# Patient Record
Sex: Female | Born: 1955 | Race: White | Hispanic: No | Smoking: Former smoker
Health system: Southern US, Community
[De-identification: ages and names within clinical notes are randomized; demographics above are authoritative.]

## PROBLEM LIST (undated history)

## (undated) DIAGNOSIS — K219 Gastro-esophageal reflux disease without esophagitis: Secondary | ICD-10-CM

## (undated) DIAGNOSIS — I10 Essential (primary) hypertension: Secondary | ICD-10-CM

## (undated) DIAGNOSIS — E119 Type 2 diabetes mellitus without complications: Secondary | ICD-10-CM

## (undated) DIAGNOSIS — I4719 Other supraventricular tachycardia: Secondary | ICD-10-CM

## (undated) DIAGNOSIS — R002 Palpitations: Secondary | ICD-10-CM

## (undated) DIAGNOSIS — I80202 Phlebitis and thrombophlebitis of unspecified deep vessels of left lower extremity: Secondary | ICD-10-CM

## (undated) DIAGNOSIS — I482 Chronic atrial fibrillation, unspecified: Secondary | ICD-10-CM

## (undated) DIAGNOSIS — E039 Hypothyroidism, unspecified: Secondary | ICD-10-CM

## (undated) DIAGNOSIS — I471 Supraventricular tachycardia: Secondary | ICD-10-CM

## (undated) DIAGNOSIS — Z8709 Personal history of other diseases of the respiratory system: Secondary | ICD-10-CM

## (undated) DIAGNOSIS — E1169 Type 2 diabetes mellitus with other specified complication: Secondary | ICD-10-CM

## (undated) DIAGNOSIS — E782 Mixed hyperlipidemia: Secondary | ICD-10-CM

## (undated) DIAGNOSIS — E663 Overweight: Secondary | ICD-10-CM

## (undated) DIAGNOSIS — E785 Hyperlipidemia, unspecified: Secondary | ICD-10-CM

## (undated) HISTORY — DX: Other supraventricular tachycardia: I47.19

## (undated) HISTORY — DX: Mixed hyperlipidemia: E78.2

## (undated) HISTORY — DX: Personal history of other diseases of the respiratory system: Z87.09

## (undated) HISTORY — DX: Hypothyroidism, unspecified: E03.9

## (undated) HISTORY — DX: Hyperlipidemia, unspecified: E78.5

## (undated) HISTORY — DX: Chronic atrial fibrillation, unspecified: I48.20

## (undated) HISTORY — DX: Type 2 diabetes mellitus with other specified complication: E11.69

## (undated) HISTORY — DX: Essential (primary) hypertension: I10

## (undated) HISTORY — DX: Palpitations: R00.2

## (undated) HISTORY — PX: NASAL SINUS SURGERY: SHX719

## (undated) HISTORY — DX: Phlebitis and thrombophlebitis of unspecified deep vessels of left lower extremity: I80.202

## (undated) HISTORY — PX: REPLACEMENT TOTAL KNEE: SUR1224

## (undated) HISTORY — DX: Gastro-esophageal reflux disease without esophagitis: K21.9

## (undated) HISTORY — DX: Supraventricular tachycardia: I47.1

## (undated) HISTORY — DX: Overweight: E66.3

---

## 1992-09-08 HISTORY — PX: BACK SURGERY: SHX140

## 1999-10-23 ENCOUNTER — Encounter: Admission: RE | Admit: 1999-10-23 | Discharge: 1999-10-23 | Payer: Self-pay | Admitting: Family Medicine

## 1999-10-23 ENCOUNTER — Encounter: Payer: Self-pay | Admitting: Family Medicine

## 1999-12-24 ENCOUNTER — Other Ambulatory Visit: Admission: RE | Admit: 1999-12-24 | Discharge: 1999-12-24 | Payer: Self-pay | Admitting: Obstetrics and Gynecology

## 2000-09-08 HISTORY — PX: COLONOSCOPY: SHX174

## 2000-09-29 ENCOUNTER — Encounter: Admission: RE | Admit: 2000-09-29 | Discharge: 2000-09-29 | Payer: Self-pay | Admitting: Family Medicine

## 2000-09-29 ENCOUNTER — Encounter: Payer: Self-pay | Admitting: Family Medicine

## 2001-06-11 ENCOUNTER — Encounter (INDEPENDENT_AMBULATORY_CARE_PROVIDER_SITE_OTHER): Payer: Self-pay | Admitting: Specialist

## 2001-06-11 ENCOUNTER — Other Ambulatory Visit: Admission: RE | Admit: 2001-06-11 | Discharge: 2001-06-11 | Payer: Self-pay | Admitting: Gastroenterology

## 2001-09-27 ENCOUNTER — Encounter (HOSPITAL_COMMUNITY): Admission: RE | Admit: 2001-09-27 | Discharge: 2001-12-26 | Payer: Self-pay | Admitting: Gastroenterology

## 2001-10-11 ENCOUNTER — Encounter: Admission: RE | Admit: 2001-10-11 | Discharge: 2002-01-09 | Payer: Self-pay | Admitting: Gastroenterology

## 2002-05-16 ENCOUNTER — Encounter: Payer: Self-pay | Admitting: Family Medicine

## 2002-05-16 ENCOUNTER — Encounter: Admission: RE | Admit: 2002-05-16 | Discharge: 2002-05-16 | Payer: Self-pay | Admitting: Family Medicine

## 2002-05-17 ENCOUNTER — Other Ambulatory Visit: Admission: RE | Admit: 2002-05-17 | Discharge: 2002-05-17 | Payer: Self-pay | Admitting: Obstetrics and Gynecology

## 2003-06-08 ENCOUNTER — Encounter: Payer: Self-pay | Admitting: Obstetrics and Gynecology

## 2003-06-08 ENCOUNTER — Encounter: Admission: RE | Admit: 2003-06-08 | Discharge: 2003-06-08 | Payer: Self-pay | Admitting: Obstetrics and Gynecology

## 2004-09-10 ENCOUNTER — Encounter: Admission: RE | Admit: 2004-09-10 | Discharge: 2004-09-10 | Payer: Self-pay | Admitting: Family Medicine

## 2005-10-03 ENCOUNTER — Encounter: Admission: RE | Admit: 2005-10-03 | Discharge: 2005-10-03 | Payer: Self-pay | Admitting: Family Medicine

## 2006-01-26 ENCOUNTER — Other Ambulatory Visit: Admission: RE | Admit: 2006-01-26 | Discharge: 2006-01-26 | Payer: Self-pay | Admitting: Obstetrics and Gynecology

## 2006-11-03 ENCOUNTER — Encounter: Admission: RE | Admit: 2006-11-03 | Discharge: 2006-11-03 | Payer: Self-pay | Admitting: Obstetrics and Gynecology

## 2007-12-02 ENCOUNTER — Encounter: Admission: RE | Admit: 2007-12-02 | Discharge: 2007-12-02 | Payer: Self-pay | Admitting: Obstetrics and Gynecology

## 2008-12-04 ENCOUNTER — Encounter: Admission: RE | Admit: 2008-12-04 | Discharge: 2008-12-04 | Payer: Self-pay | Admitting: Obstetrics and Gynecology

## 2009-12-05 ENCOUNTER — Ambulatory Visit: Payer: Self-pay | Admitting: Cardiovascular Disease

## 2009-12-13 ENCOUNTER — Telehealth (INDEPENDENT_AMBULATORY_CARE_PROVIDER_SITE_OTHER): Payer: Self-pay | Admitting: *Deleted

## 2009-12-13 ENCOUNTER — Ambulatory Visit: Payer: Self-pay | Admitting: Internal Medicine

## 2010-01-03 ENCOUNTER — Telehealth (INDEPENDENT_AMBULATORY_CARE_PROVIDER_SITE_OTHER): Payer: Self-pay | Admitting: *Deleted

## 2010-01-07 ENCOUNTER — Encounter (HOSPITAL_COMMUNITY): Admission: RE | Admit: 2010-01-07 | Discharge: 2010-03-13 | Payer: Self-pay | Admitting: Internal Medicine

## 2010-01-07 ENCOUNTER — Ambulatory Visit: Payer: Self-pay | Admitting: Cardiology

## 2010-01-07 ENCOUNTER — Ambulatory Visit: Payer: Self-pay

## 2010-01-08 ENCOUNTER — Encounter: Payer: Self-pay | Admitting: Cardiology

## 2010-01-08 ENCOUNTER — Ambulatory Visit: Payer: Self-pay

## 2010-01-10 ENCOUNTER — Telehealth: Payer: Self-pay | Admitting: Internal Medicine

## 2010-01-11 ENCOUNTER — Ambulatory Visit: Payer: Self-pay | Admitting: Cardiovascular Disease

## 2010-03-08 ENCOUNTER — Encounter: Admission: RE | Admit: 2010-03-08 | Discharge: 2010-03-08 | Payer: Self-pay | Admitting: Obstetrics and Gynecology

## 2010-09-29 ENCOUNTER — Encounter: Payer: Self-pay | Admitting: Obstetrics and Gynecology

## 2010-10-10 NOTE — Assessment & Plan Note (Signed)
Summary: Cardiology Nuclear  Study  Nuclear Med Background Indications for Stress Test: Evaluation for Ischemia, Abnormal EKG   History: Echo  History Comments: 01/10 ECHO NL LVF  Symptoms: Chest Pain, Chest Pain with Exertion, DOE, Palpitations    Nuclear Pre-Procedure Cardiac Risk Factors: Hypertension, Lipids, Obesity Caffeine/Decaff Intake: None NPO After: 5:30 PM Lungs: clear IV 0.9% NS with Angio Cath: 22g     IV Site: (R) AC IV Started by: Irean Hong RN Chest Size (in) 48     Cup Size DD     Height (in): 62 Weight (lb): 252 BMI: 46.26 Tech Comments: Held metoprolol 24 hrs.  Nuclear Med Study 1 or 2 day study:  2 day     Stress Test Type:  Eugenie Birks Reading MD:  Marca Ancona, MD     Referring MD:  S.Klein Resting Radionuclide:  Technetium 78m Tetrofosmin     Resting Radionuclide Dose:  33 mCi  Stress Radionuclide:  Technetium 106m Tetrofosmin     Stress Radionuclide Dose:  33 mCi   Stress Protocol   Lexiscan: 0.4 mg   Stress Test Technologist:  Milana Na EMT-P     Nuclear Technologist:  Domenic Polite CNMT  Rest Procedure  Myocardial perfusion imaging was performed at rest 45 minutes following the intravenous administration of Myoview Technetium 63m Tetrofosmin.  Stress Procedure  The patient received IV Lexiscan 0.4 mg over 15-seconds.  Myoview injected at 30-seconds.  There were no significant changes and a rare pac with infusion.  Quantitative spect images were obtained after a 45 minute delay.  QPS Raw Data Images:  Mild breast attenuation.  Normal left ventricular size. Stress Images:  Small, mild mid anterior perfusion defect.  Rest Images:  Small, mild mid anterior perfusion defect.  Subtraction (SDS):  Fixed small, mild mid anterior perfusion defect.  Transient Ischemic Dilatation:  .85  (Normal <1.22)  Lung/Heart Ratio:  .26  (Normal <0.45)  Quantitative Gated Spect Images QGS EDV:  95 ml QGS ESV:  20 ml QGS EF:  72 % QGS cine images:   Normal wall motion.    Overall Impression  Exercise Capacity: Lexiscan study.  BP Response: Normal blood pressure response. Clinical Symptoms: Short of breath.  ECG Impression: No significant ST segment change suggestive of ischemia. Overall Impression: Breast attenuation.  No evidence for ischemia or infarction.  Normal systolic function.   Appended Document: Cardiology Nuclear  Study plz let pt know looks good  Appended Document: Cardiology Nuclear  Study Victorino Dike plz inform patient thanks steve

## 2010-10-10 NOTE — Progress Notes (Signed)
----   Converted from flag ---- ---- 12/13/2009 11:57 AM, Marilynne Halsted, CMA, AAMA wrote: Lori Phelps,  Pt has [employer].  No precert required for anything with this insurance.  Thanks,  Charmaine ------------------------------

## 2010-10-10 NOTE — Progress Notes (Signed)
  Phone Note Outgoing Call   Call placed by: Dessie Coma LPN Call placed to: Patient Summary of Call: Patient notified per Dr. Graciela Husbands, stress test looked good.

## 2010-10-10 NOTE — Progress Notes (Signed)
Summary: Nuclear Pre-Procedure  Phone Note Outgoing Call   Call placed by: Milana Na, EMT-P,  January 03, 2010 12:29 PM Summary of Call: Reviewed information on Myoview Information Sheet (see scanned document for further details).  Spoke with patient.     Nuclear Med Background Indications for Stress Test: Evaluation for Ischemia, Abnormal EKG   History: Echo  History Comments: 01/10 ECHO NL LVF  Symptoms: Chest Pain, Chest Pain with Exertion, DOE, Palpitations    Nuclear Pre-Procedure Cardiac Risk Factors: Hypertension, Lipids, Obesity  Nuclear Med Study Referring MD:  Odessa Fleming

## 2010-12-30 ENCOUNTER — Other Ambulatory Visit: Payer: Self-pay | Admitting: Obstetrics and Gynecology

## 2010-12-30 DIAGNOSIS — Z1231 Encounter for screening mammogram for malignant neoplasm of breast: Secondary | ICD-10-CM

## 2011-01-21 NOTE — Assessment & Plan Note (Signed)
[facility]                         CARDIOLOGY OFFICE NOTE   NAME:Derks, KRISTAL PERL                       MRN:          [id number]  DATE:01/11/2010                            DOB:          08-12-1956    PROBLEM LIST:  1. Atrial tachycardia.  2. Hypertension.  3. Hyperlipidemia.  4. Hypothyroidism.   INTERVAL HISTORY:  Since her last visit she saw Dr. Graciela Husbands, who felt that  she was experiencing atrial tachycardia.  The patient states that since  her initial visit her palpitations have greatly improved.  She still  occasionally experiences some, they are not worrisome to her.  The  patient denies any significant chest discomfort.  She does not exercise  regularly.  She has been compliant with her medications.   PHYSICAL EXAMINATION:  VITAL SIGNS:  Blood pressure 167/90, pulse 71,  blood pressure rechecked was 138/93, sating 97% on room air, and weight  250 pounds which is 4-5 pounds less than she weighed at her last office  visit.  GENERAL:  No acute distress.  HEENT:  Normocephalic and atraumatic.  NECK:  Supple.  There is no JVD.  HEART:  Regular rate and rhythm without murmur, rub, or gallop.  LUNGS:  Clear bilaterally.  ABDOMEN:  Soft, nontender, and nondistended.  EXTREMITIES:  Trace edema.  SKIN:  Warm and dry.   Review of the patient's stress test, ejection fraction was normal, there  is no evidence of inducible ischemia.   ASSESSMENT AND PLAN:  1. Arrhythmia.  The patient appears has had atrial tachycardia.  Her      symptoms have been greatly improved.  She should continue on the      metoprolol twice a day.  2. Hypertension.  Blood pressure was initially elevated, but      significantly improved upon recheck.  She should continue on      metoprolol, HCTZ, and amlodipine.  3. Hyperlipidemia.  She is on simvastatin.  Should be followed by Dr.      Marina Goodell.  4. Hypothyroidism.  Dr. Marina Goodell is following her for this.  We will see    the patient back in 4 months' time.     Brayton El, MD  Electronically Signed    SGA/MedQ  DD: 01/11/2010  DT: 01/11/2010  Job #: [id number]   cc:   Brent Bulla, MD

## 2011-01-21 NOTE — Letter (Signed)
December 13, 2009    Brayton El, MD  [address]  [place], [place] [address]   RE:  Lori Phelps, Lori Phelps  MRN:  [id number]  /  DOB:  08-02-1956   Dear Roseanne Reno,   Thank you very much for asking me to see [facility] because of  palpitations.   As you know she is an obese 55 year old woman with a family history of  atrial fibrillation and polycystic kidney disease from which she is  spared who has a history of treated hypertension, dyslipidemia and other  risk factors for arrhythmias including obesity, who has had over the  last four years or so recurrent episodes of palpitations.   Initially these occurred relatively infrequently and she described them  mostly as skips.  Four months ago she had an episode that lasted on and  off for a couple of days; this was also mostly skipping.  Last week she  had episodes that were more problematic and she described those as  flopping.  When she came to see you in the office initial echocardiogram  was apparently normal.  Subsequent electrocardiogram showed nonsustained  atrial tachyarrhythmias.   Her past evaluation had included outpatient event monitoring done by Dr.  Marina Goodell.  Since she saw you, an echocardiogram has confirmed one from last  year that demonstrated normal left ventricular function, normal left  atrial size.   Thromboembolic risk factors is notable for hypertension and gender.   She does not have daytime somnolence or a.m. fatigue.  She does have  some snoring.   Blood work has been obtained and is notable for borderline elevated TSH  for which Synthroid was started.  She was also noted to be microcytic  but without anemia and iron therapy has been recommended.   She has some limitations of exertion at less than two flights of stairs.  She also has intermittent exertional chest pain.  Cardiac risk factors  are notable for family history and hypercholesterolemia.   PAST MEDICAL HISTORY:  Notable primarily as  above.   SOCIAL HISTORY:  She is married.  She has children and grandchildren.  She does not use cigarettes or recreational drugs.  She does drink wine  occasionally.   FAMILY HISTORY:  As noted above.  Interestingly, her father, paternal  uncle, paternal aunt and one of her siblings has atrial fibrillation.   Notable also they have a family history of polycystic kidney disease.   CURRENT MEDICATIONS:  1. Aspirin 81.  2. Synthroid 25 mcg.  3. Metoprolol 50, previously now raised to 100.  4. Amlodipine 10.  5. Omeprazole.  6. Simvastatin.  7. Hydrochlorothiazide 25.   ALLERGIES:  PEN AND SULFA.   REVIEW OF SYSTEMS:  Apart from the above is notable for tinnitus in her  left ear and is otherwise negative across multiple organ systems apart  from obesity.   EXAMINATION:  She is an middle-aged Caucasian female appearing her  stated age of 29.  Her blood pressure was 147/88, her pulse was 61, her  weight was 254 pounds.  She was in no acute distress.  She was alert and  oriented.  SKIN:  Warm and dry.  HEENT:  Normal.  NECK:  Veins were impossible to discern, carotids are brisk and full.  BACK:  Without kyphosis, scoliosis.  LUNGS:  Clear.  There was no CVA tenderness.  HEART:  Sounds were regular today without murmurs or gallops.  ABDOMEN:  Soft and protuberant.  Femoral  pulses were not appreciated.  Distal pulses were intact.  There  is no clubbing, cyanosis or edema.  NEUROLOGICAL:  Otherwise grossly normal.   Review of the electrocardiograms was interesting in that while  __________ by the computer's atrial fibrillation it does in fact  represent sinus rhythm with frequent and consecutive runs of PACs and  nonsustained atrial tachycardia.   IMPRESSION:  1. Atrial tachycardia - nonsustained with frequent premature atrial      contractions.  2. Strong family history for atrial fibrillation, likely to come in      the patient of which #1 is a harbinger.  3. Normal left  ventricular function.  4. Thromboembolic risk factors notable for gender and hypertension.  5. Exertional and nonexertional chest discomfort associated with      exertional dyspnea with cardiac risk factors.   Stew, Ms. Cadle has short runs of nonsustained atrial tachycardia which  likely will result in atrial fibrillation.  It is not there yet.  I  think the discussion then of anticoagulation is probably coming down the  road just as you began it with her the other day.  I think aspirin is a  reasonable thing at this point however given the lack of sustained  arrhythmia.   We discussed the potential role of antiarrhythmic drug therapy for  control of her symptoms.  We discussed the potential issues of  proarrhythmia.  She would like for right now to defer initiation of  therapy until we have a better handle on the frequency of her symptoms.  We also discussed the potential benefits it might have in presenting  remodelling which would make her atrial fibrillation possibly more  likely to sustain and persist.   I was also concerned about her exertional chest discomfort accompanied  by dyspnea and with her cardiac risk factors.  We will plan to undertake  Myoview scanning as she has significant baseline ST-segment  abnormalities which precludes standard stress testing.   Thanks very much for the consultation, will plan to see her again in 3 -  4 months.    Sincerely,      Duke Salvia, MD, [facility]  Electronically Signed    SCK/MedQ  DD: 12/13/2009  DT: 12/13/2009  Job #: [id number]   CC:    Dr. Brent Bulla

## 2011-01-21 NOTE — Assessment & Plan Note (Signed)
[facility]                        [place] CARDIOLOGY OFFICE NOTE   NAME:Lori Phelps, Lori Phelps                       MRN:          [id number]  DATE:12/05/2009                            DOB:          June 01, 1956    CHIEF COMPLAINT:  Palpitations.   HISTORY OF PRESENT ILLNESS:  Lori Phelps is a 55 year old white female  with past medical history significant for hypertension, mild  hyperlipidemia, recent diagnosis of hypothyroidism who is presenting  with worsening palpitations.  The patient states she has a family  history of atrial fibrillation and believes that she has had it  intermittently for at least a year.  She has worn a Holter monitor in  the past and has had EKGs in the past, but neither which have confirmed  atrial fibrillation.  She states that it is paroxysmal in nature.  Prior  to yesterday, she was experiencing an episode maybe once every 3 weeks,  sometimes lasting all day, sometimes lasting less than an hour.  Yesterday morning she awoke and felt palpitations, but it seemed to be  stronger.  It lasted throughout the day.  She saw Dr. Marina Goodell; however,  she was in normal sinus rhythm at that time and she states the  palpitations were not present during the EKG.  These palpitations are  sometimes associated with dizziness, but she denies any syncopal  episodes.  She also denies any chest discomfort other than the  palpitations.  The patient does endorse some dyspnea on exertion.   PAST MEDICAL HISTORY:  As above in HPI.  The patient also has been  demonstrated to be iron deficient with a ferritin of 17.  She states  that this problem dates back to 6 or 7 years ago when she had an upper  and lower endoscopy by Dr. Jarold Motto in [place].  They thought that  maybe she was gluten intolerant and that this was leading to her not  absorbing an appropriate amount of iron.  She states that she had her  stool checked for blood within the past year and  it was negative.  She  denies any history of bleeding.   SOCIAL HISTORY:  History of tobacco, but no longer smokes.  She drinks  approximately 1 glass of wine per day.  Interestingly, she had her last  glass of wine on Sunday and the palpitations seem to worsen on  Wednesday.  She is confident that she only drinks 1 glass of wine a day.   FAMILY HISTORY:  Negative for premature coronary artery disease.   ALLERGIES:  PENICILLIN and SULFA.   MEDICATIONS:  1. Aspirin 81 mg daily.  2. Synthroid 0.025 mg daily.  3. Iron supplementation.  4. Multivitamin.  5. Advair.  6. Hydrochlorothiazide 25 mg daily.  7. Amlodipine 10 mg daily.  8. Metoprolol succinate 50 mg daily.  9. Omeprazole 20 mg daily.  10.Simvastatin 20 mg daily.   REVIEW OF SYSTEMS:  As in HPI.  All other systems were reviewed and are  negative.   PHYSICAL EXAMINATION:  VITAL SIGNS:  Blood pressure is 137/87, pulse 82,  she  weighs 254 pounds.  She is satting 97% on room air.  GENERAL:  No acute distress.  HEENT:  Normocephalic, atraumatic.  NECK:  Supple.  There is no JVD.  There is no carotid bruits.  HEART:  Regular rate and rhythm upon my examination without murmur, rub  or gallop.  LUNGS:  Clear bilaterally.  ABDOMEN:  Soft and nontender.  EXTREMITIES:  Without edema.  SKIN:  Warm and dry.  PSYCHIATRIC:  The patient is appropriate with normal levels of insight.   EKG taken in clinic prior to be seeing the patient demonstrates atrial  fibrillation with a ventricular response rate of 114 beats per minute.  Review of patient's recent lab work dated December 04, 2009, BMP showed a  sodium of 139, potassium 4.6, chloride 98, BUN 11, creatinine 0.79.  CBC  showed a white count of 11.6, hemoglobin 13.5, hematocrit 42, MCV was 71  and platelet count was 344.  A ferritin level was 17, TSH was 5.75.  LFTs were within normal limits.   ASSESSMENT:  The patient appears to have paroxysmal atrial fibrillation.  Her CHADS2  score is 1 because of hypertension.  Her CHADs VASC score is  2 because of hypertension and her gender.  Anticoagulation would be  reasonable; however she is hesitant to do this because of her relatively  young age.  I am also somewhat concerned about the iron deficiency she  appears to be having and would like to confirm that there is no GI  bleeding that is occurring.   PLAN:  Today in clinic, we will ask her to increase her Toprol-XL from  50-100 mg daily.  I will also ask her to increase her aspirin to 325 mg  daily.  We will check a transthoracic echocardiogram to ensure normal  left ventricular systolic function.  The patient will likely be a  reasonable candidate for antiarrhythmic therapy with Multaq.  We will  have the patient see Dr. Graciela Husbands on April 7.  In the interim, we will  attempt to gather information regarding the patient's past endoscopies.     Brayton El, MD  Electronically Signed    SGA/MedQ  DD: 12/05/2009  DT: 12/06/2009  Job #: [id number]

## 2011-03-04 ENCOUNTER — Encounter: Payer: Self-pay | Admitting: Cardiovascular Disease

## 2011-03-05 ENCOUNTER — Telehealth: Payer: Self-pay | Admitting: Cardiovascular Disease

## 2011-03-05 NOTE — Telephone Encounter (Signed)
Pt in [place], hasn't been there since dr Freida Busman was there a year ago, heart out of rhythm for two days what to do

## 2011-03-05 NOTE — Telephone Encounter (Signed)
Spoke with Pt and she took her Metoprolol on an empty stomach and she said it has been in rhythm all day. We did schedule her for 7/2.

## 2011-03-06 ENCOUNTER — Encounter: Payer: Self-pay | Admitting: Cardiovascular Disease

## 2011-03-10 ENCOUNTER — Encounter: Payer: Self-pay | Admitting: Cardiovascular Disease

## 2011-03-10 ENCOUNTER — Other Ambulatory Visit: Payer: Self-pay | Admitting: *Deleted

## 2011-03-10 ENCOUNTER — Ambulatory Visit
Admission: RE | Admit: 2011-03-10 | Discharge: 2011-03-10 | Disposition: A | Discharge status: Home / Self Care | Source: Ambulatory Visit | Attending: Obstetrics and Gynecology | Admitting: Obstetrics and Gynecology

## 2011-03-10 ENCOUNTER — Ambulatory Visit (INDEPENDENT_AMBULATORY_CARE_PROVIDER_SITE_OTHER): Admitting: Cardiovascular Disease

## 2011-03-10 VITALS — BP 155/91 | HR 70 | Ht 63.0 in | Wt 255.0 lb

## 2011-03-10 DIAGNOSIS — I498 Other specified cardiac arrhythmias: Secondary | ICD-10-CM

## 2011-03-10 DIAGNOSIS — I10 Essential (primary) hypertension: Secondary | ICD-10-CM

## 2011-03-10 DIAGNOSIS — Z1231 Encounter for screening mammogram for malignant neoplasm of breast: Secondary | ICD-10-CM

## 2011-03-10 DIAGNOSIS — E785 Hyperlipidemia, unspecified: Secondary | ICD-10-CM

## 2011-03-10 DIAGNOSIS — I471 Supraventricular tachycardia: Secondary | ICD-10-CM | POA: Insufficient documentation

## 2011-03-10 MED ORDER — METOPROLOL TARTRATE 50 MG PO TABS: ORAL_TABLET | ORAL | Status: DC

## 2011-03-10 NOTE — Progress Notes (Signed)
HPI  This is a 55 year old female who is here today for a followup visit. She was last seen in May of 2011 by Dr. Freida Busman. She has history of atrial tachycardia and frequent PACs. She was also evaluated by Dr. Graciela Husbands. She had an echocardiogram done last year which showed normal LV systolic function, no significant valvular abnormalities and normal left atrial size. She did have exertional dyspnea and underwent a nuclear stress test which showed no evidence of ischemia. She was treated with metoprolol twice daily and has not had recurrent arrhythmia for over a year up until recently last week when she had 2 days in a row with she had intermittent palpitations and irregular heartbeats without any other symptoms. She did not have dyspnea, dizziness, presyncope or syncope. The episodes were intermittent and each lasted for about 10-15 minutes. She checked her pulse with a machine she has and it was found to be 45 beats per minute on few occasions. The episodes happened at rest when she was drinking a glass of wine. She does not consume excessive amount of alcohol on caffeine. Her thyroid function has been regulated according to her.  Allergies  Allergen Reactions  . Penicillins     REACTION: Reaction not known  . Sulfonamide Derivatives     REACTION: Reaction not known     Current Outpatient Prescriptions on File Prior to Visit  Medication Sig Dispense Refill  . amLODipine (NORVASC) 10 MG tablet Take 10 mg by mouth daily.        . Fluticasone-Salmeterol (ADVAIR DISKUS) 250-50 MCG/DOSE AEPB Inhale 1 puff into the lungs every 12 (twelve) hours.        . hydrochlorothiazide 25 MG tablet Take 25 mg by mouth daily.        Marland Kitchen levothyroxine (SYNTHROID, LEVOTHROID) 25 MCG tablet Take 25 mcg by mouth daily.        . metoprolol (TOPROL-XL) 50 MG 24 hr tablet Take 50 mg by mouth 2 (two) times daily.        Marland Kitchen omeprazole (PRILOSEC) 20 MG capsule Take 20 mg by mouth daily.        . simvastatin (ZOCOR) 20 MG tablet  Take 20 mg by mouth at bedtime.        Marland Kitchen DISCONTD: aspirin 81 MG tablet Take 81 mg by mouth daily.        Marland Kitchen DISCONTD: ferrous sulfate 325 (65 FE) MG tablet Take 325 mg by mouth daily with breakfast.           Past Medical History  Diagnosis Date  . Hypothyroidism   . Atrial tachycardia   . HTN (hypertension)   . HLD (hyperlipidemia)      Past Surgical History  Procedure Date  . Back surgery 1994     Family History  Problem Relation Age of Onset  . Kidney disease       History   Social History  . Marital Status: Married    Spouse Name: N/A    Number of Children: 2  . Years of Education: N/A   Occupational History  . Not on file.   Social History Main Topics  . Smoking status: Former Games developer  . Smokeless tobacco: Not on file  . Alcohol Use: 4.2 oz/week    7 Glasses of wine per week  . Drug Use: No  . Sexually Active: Not on file   Other Topics Concern  . Not on file   Social History Narrative  . No narrative on  file     ROS Constitutional: Negative for fever, chills, diaphoresis, activity change, appetite change and fatigue.  HENT: Negative for hearing loss, nosebleeds, congestion, sore throat, facial swelling, drooling, trouble swallowing, neck pain, voice change, sinus pressure and tinnitus.  Eyes: Negative for photophobia, pain, discharge and visual disturbance.  Respiratory: Negative for apnea, cough, chest tightness, shortness of breath and wheezing.  Cardiovascular: Negative for chest pain and leg swelling.  Gastrointestinal: Negative for nausea, vomiting, abdominal pain, diarrhea, constipation, blood in stool and abdominal distention.  Genitourinary: Negative for dysuria, urgency, frequency, hematuria and decreased urine volume.  Musculoskeletal: Negative for myalgias, back pain, joint swelling, arthralgias and gait problem.  Skin: Negative for color change, pallor, rash and wound.  Neurological: Negative for dizziness, tremors, seizures, syncope,  speech difficulty, weakness, light-headedness, numbness and headaches.  Psychiatric/Behavioral: Negative for suicidal ideas, hallucinations, behavioral problems and agitation. The patient is not nervous/anxious.     PHYSICAL EXAM   BP 155/91  Pulse 70  Ht 5\' 3"  (1.6 m)  Wt 255 lb (115.667 kg)  BMI 45.17 kg/m2  SpO2 96%  Constitutional: She is oriented to person, place, and time. She appears well-developed and well-nourished. No distress.  HENT: No nasal discharge.  Head: Normocephalic and atraumatic.  Eyes: Pupils are equal, round, and reactive to light. Right eye exhibits no discharge. Left eye exhibits no discharge.  Neck: Normal range of motion. Neck supple. No JVD present. No thyromegaly present.  Cardiovascular: Normal rate, regular rhythm, normal heart sounds and intact distal pulses. Exam reveals no gallop and no friction rub.  No murmur heard.  Pulmonary/Chest: Effort normal and breath sounds normal. No stridor. No respiratory distress. She has no wheezes. She has no rales. She exhibits no tenderness.  Abdominal: Soft. Bowel sounds are normal. She exhibits no distension. There is no tenderness. There is no rebound and no guarding.  Musculoskeletal: Normal range of motion. She exhibits no edema and no tenderness.  Neurological: She is alert and oriented to person, place, and time. Coordination normal.  Skin: Skin is warm and dry. No rash noted. She is not diaphoretic. No erythema. No pallor.  Psychiatric: She has a normal mood and affect. Her behavior is normal. Judgment and thought content normal.     EKG: Normal sinus rhythm with no significant ST or T wave changes.   ASSESSMENT AND PLAN

## 2011-03-10 NOTE — Patient Instructions (Signed)
Your physician recommends that you schedule a follow-up appointment in: 6 months Your physician has recommended that you wear a holter monitor. Holter monitors are medical devices that record the heart's electrical activity. Doctors most often use these monitors to diagnose arrhythmias. Arrhythmias are problems with the speed or rhythm of the heartbeat. The monitor is a small, portable device. You can wear one while you do your normal daily activities. This is usually used to diagnose what is causing palpitations/syncope (passing out).  

## 2011-03-10 NOTE — Assessment & Plan Note (Signed)
The patient had recent palpitations without any other associated symptoms. Her cardiac workup last year included an echocardiogram and a stress test. Both of them were unremarkable. She is currently in normal sinus rhythm. I suspect that her palpitations are likely related to her known history of atrial tachycardia. I think an underlying atrial fibrillation will need to be ruled out as well due to implications on long-term need for anticoagulation.  I will request a 48 hour Holter monitor. Will also verify that she is taking metoprolol tartrate and not succinate. I informed her that she can use an extra dose of metoprolol 50 mg as needed for breakthrough palpitations. I am not inclined to increase the dose to 100 mg twice daily as that would likely cause significant baseline bradycardia.

## 2011-03-10 NOTE — Assessment & Plan Note (Signed)
She is on simvastatin 20 mg daily and being monitored by her primary care physician.

## 2011-03-10 NOTE — Assessment & Plan Note (Signed)
Her blood pressure is elevated but she seems to be very anxious at this time which I think is playing a role. Would continue to monitor this.

## 2011-03-13 ENCOUNTER — Other Ambulatory Visit: Payer: Self-pay | Admitting: *Deleted

## 2011-03-13 ENCOUNTER — Ambulatory Visit (INDEPENDENT_AMBULATORY_CARE_PROVIDER_SITE_OTHER): Admitting: Cardiovascular Disease

## 2011-03-13 DIAGNOSIS — R0989 Other specified symptoms and signs involving the circulatory and respiratory systems: Secondary | ICD-10-CM

## 2011-03-13 MED ORDER — HYDROCHLOROTHIAZIDE 25 MG PO TABS: 25.0000 mg | ORAL_TABLET | Freq: Every day | ORAL | Status: DC

## 2011-03-13 MED ORDER — SIMVASTATIN 20 MG PO TABS: 20.0000 mg | ORAL_TABLET | Freq: Every day | ORAL | Status: DC

## 2011-03-13 MED ORDER — METOPROLOL TARTRATE 50 MG PO TABS: ORAL_TABLET | ORAL | Status: DC

## 2011-03-13 MED ORDER — AMLODIPINE BESYLATE 10 MG PO TABS: 10.0000 mg | ORAL_TABLET | Freq: Every day | ORAL | Status: DC

## 2011-03-27 ENCOUNTER — Encounter: Payer: Self-pay | Admitting: Cardiovascular Disease

## 2011-07-16 ENCOUNTER — Encounter: Payer: Self-pay | Admitting: Cardiovascular Disease

## 2011-09-10 ENCOUNTER — Ambulatory Visit: Admitting: Cardiovascular Disease

## 2011-10-13 ENCOUNTER — Encounter: Payer: Self-pay | Admitting: Cardiovascular Disease

## 2012-03-23 ENCOUNTER — Other Ambulatory Visit: Payer: Self-pay | Admitting: Obstetrics and Gynecology

## 2012-03-23 DIAGNOSIS — Z1231 Encounter for screening mammogram for malignant neoplasm of breast: Secondary | ICD-10-CM

## 2012-04-06 ENCOUNTER — Ambulatory Visit
Admission: RE | Admit: 2012-04-06 | Discharge: 2012-04-06 | Disposition: A | Discharge status: Home / Self Care | Source: Ambulatory Visit | Attending: Obstetrics and Gynecology | Admitting: Obstetrics and Gynecology

## 2012-04-06 DIAGNOSIS — Z1231 Encounter for screening mammogram for malignant neoplasm of breast: Secondary | ICD-10-CM

## 2013-03-07 ENCOUNTER — Other Ambulatory Visit: Payer: Self-pay

## 2013-03-07 DIAGNOSIS — Z1231 Encounter for screening mammogram for malignant neoplasm of breast: Secondary | ICD-10-CM

## 2013-04-07 ENCOUNTER — Ambulatory Visit
Admission: RE | Admit: 2013-04-07 | Discharge: 2013-04-07 | Disposition: A | Discharge status: Home / Self Care | Source: Ambulatory Visit

## 2013-04-07 DIAGNOSIS — Z1231 Encounter for screening mammogram for malignant neoplasm of breast: Secondary | ICD-10-CM

## 2013-05-03 ENCOUNTER — Encounter: Payer: Self-pay | Admitting: Cardiovascular Disease

## 2013-05-03 ENCOUNTER — Ambulatory Visit (INDEPENDENT_AMBULATORY_CARE_PROVIDER_SITE_OTHER): Admitting: Cardiovascular Disease

## 2013-05-03 VITALS — BP 144/90 | HR 62 | Ht 63.0 in | Wt 260.8 lb

## 2013-05-03 DIAGNOSIS — I498 Other specified cardiac arrhythmias: Secondary | ICD-10-CM

## 2013-05-03 DIAGNOSIS — I471 Supraventricular tachycardia: Secondary | ICD-10-CM

## 2013-05-03 DIAGNOSIS — I10 Essential (primary) hypertension: Secondary | ICD-10-CM

## 2013-05-03 NOTE — Assessment & Plan Note (Signed)
No change in symptoms. No evidence of recurrent arrhythmia. Episodes of "skipping" without tachycardia are suggestive of premature beats. Continue treatment with Metoprolol.  If symptoms become more frequent, an outpatient telemetry can be considered.

## 2013-05-03 NOTE — Patient Instructions (Addendum)
Continue same medication    Your physician wants you to follow-up in: 1 year. You will receive a reminder letter in the mail two months in advance. If you don't receive a letter, please call our office to schedule the follow-up appointment.  

## 2013-05-03 NOTE — Progress Notes (Signed)
HPI  This is a 57 year old female who is here today for a followup visit. She was most recently seen in 2012 in [place] office.  She has a history of atrial tachycardia and frequent PACs. She was also evaluated by Dr. Graciela Husbands in the past.  Echocardiogram in 2011 showed normal LV systolic function, no significant valvular abnormalities and normal left atrial size. She did have exertional dyspnea and underwent a nuclear stress test in 2011 which showed no evidence of ischemia. She has been treated with metoprolol. Over last 2 years, she reports 3-4 brief episodes of palpitations without tachycardia, dizziness, syncope or presyncope. No dyspnea or chest pain.   Allergies  Allergen Reactions  . Penicillins     REACTION: Reaction not known  . Sulfonamide Derivatives     REACTION: Reaction not known     Current Outpatient Prescriptions on File Prior to Visit  Medication Sig Dispense Refill  . amLODipine (NORVASC) 10 MG tablet Take 1 tablet (10 mg total) by mouth daily.  90 tablet  2  . aspirin 325 MG tablet Take 325 mg by mouth daily.        . Fluticasone-Salmeterol (ADVAIR DISKUS) 250-50 MCG/DOSE AEPB Inhale 1 puff into the lungs every 12 (twelve) hours.        . hydrochlorothiazide 25 MG tablet Take 1 tablet (25 mg total) by mouth daily.  90 tablet  2  . levothyroxine (SYNTHROID, LEVOTHROID) 25 MCG tablet Take 25 mcg by mouth daily.        . metoprolol (LOPRESSOR) 50 MG tablet Take 1 tablet (50 mg) twice daily and take an extra dose as needed for palpitations & tachycardia  270 tablet  2  . omeprazole (PRILOSEC) 20 MG capsule Take 20 mg by mouth daily.        . simvastatin (ZOCOR) 20 MG tablet Take 1 tablet (20 mg total) by mouth at bedtime.  90 tablet  2   No current facility-administered medications on file prior to visit.     Past Medical History  Diagnosis Date  . Hypothyroidism   . Atrial tachycardia   . HTN (hypertension)   . HLD (hyperlipidemia)      Past Surgical  History  Procedure Laterality Date  . Back surgery  1994     Family History  Problem Relation Age of Onset  . Kidney disease       History   Social History  . Marital Status: Married    Spouse Name: N/A    Number of Children: 2  . Years of Education: N/A   Occupational History  . Not on file.   Social History Main Topics  . Smoking status: Former Games developer  . Smokeless tobacco: Not on file  . Alcohol Use: 4.2 oz/week    7 Glasses of wine per week  . Drug Use: No  . Sexual Activity: Not on file   Other Topics Concern  . Not on file   Social History Narrative  . No narrative on file     PHYSICAL EXAM   Ht 5\' 3"  (1.6 m)  Wt 260 lb 12.8 oz (118.298 kg)  BMI 46.21 kg/m2 Constitutional: She is oriented to person, place, and time. She appears well-developed and well-nourished. No distress.  HENT: No nasal discharge.  Head: Normocephalic and atraumatic.  Eyes: Pupils are equal and round. Right eye exhibits no discharge. Left eye exhibits no discharge.  Neck: Normal range of motion. Neck supple. No JVD present. No thyromegaly present.  Cardiovascular: Normal rate, regular rhythm, normal heart sounds. Exam reveals no gallop and no friction rub. No murmur heard.  Pulmonary/Chest: Effort normal and breath sounds normal. No stridor. No respiratory distress. She has no wheezes. She has no rales. She exhibits no tenderness.  Abdominal: Soft. Bowel sounds are normal. She exhibits no distension. There is no tenderness. There is no rebound and no guarding.  Musculoskeletal: Normal range of motion. She exhibits no edema and no tenderness.  Neurological: She is alert and oriented to person, place, and time. Coordination normal.  Skin: Skin is warm and dry. No rash noted. She is not diaphoretic. No erythema. No pallor.  Psychiatric: She has a normal mood and affect. Her behavior is normal. Judgment and thought content normal.     EKG: NSR   ASSESSMENT AND PLAN

## 2013-05-03 NOTE — Assessment & Plan Note (Signed)
BP is mildly elevated which is usually the case when seeing an MD. There is likely a component of white coat syndrome.

## 2014-03-07 ENCOUNTER — Other Ambulatory Visit: Payer: Self-pay

## 2014-03-07 DIAGNOSIS — Z1231 Encounter for screening mammogram for malignant neoplasm of breast: Secondary | ICD-10-CM

## 2014-04-07 ENCOUNTER — Encounter: Payer: Self-pay | Admitting: Gastroenterology

## 2014-04-10 ENCOUNTER — Ambulatory Visit

## 2014-05-30 ENCOUNTER — Ambulatory Visit
Admission: RE | Admit: 2014-05-30 | Discharge: 2014-05-30 | Disposition: A | Discharge status: Home / Self Care | Source: Ambulatory Visit

## 2014-05-30 DIAGNOSIS — Z1231 Encounter for screening mammogram for malignant neoplasm of breast: Secondary | ICD-10-CM

## 2015-05-17 ENCOUNTER — Other Ambulatory Visit: Payer: Self-pay

## 2015-05-17 DIAGNOSIS — Z1231 Encounter for screening mammogram for malignant neoplasm of breast: Secondary | ICD-10-CM

## 2015-06-22 ENCOUNTER — Ambulatory Visit

## 2015-07-16 ENCOUNTER — Encounter: Payer: Self-pay | Admitting: Internal Medicine

## 2015-08-15 NOTE — Patient Instructions (Addendum)
Your procedure is scheduled on:  Wednesday, Dec. 14, 2016  [facility] at:  7:00 A.M.  Pick up the phone at the desk and dial [phone].  Call this number if you have problems the morning of surgery: [phone].  Remember: Do NOT eat food or drink after:  Midnight Tuesday, Dec. 13, 2016 Take these medicines the morning of surgery with a SIP OF WATER:  Amlodipine, Hydrochlorothiazide, Levothyroxine, Metoprolol, Omeprazole  *Bring in asthma inhalers day of surgery  **Do NOT take Metformin 24 hours prior to surgery  Do NOT wear jewelry (body piercing), metal hair clips/bobby pins, make-up, or nail polish. Do NOT wear lotions, powders, or perfumes.  You may wear deoderant. Do NOT shave for 48 hours prior to surgery. Do NOT bring valuables to the hospital. Contacts, dentures, or bridgework may not be worn into surgery. Have a responsible adult drive you home and stay with you for 24 hours after your procedure

## 2015-08-16 ENCOUNTER — Encounter (HOSPITAL_COMMUNITY): Payer: Self-pay

## 2015-08-16 ENCOUNTER — Encounter (HOSPITAL_COMMUNITY)
Admission: RE | Admit: 2015-08-16 | Discharge: 2015-08-16 | Disposition: A | Discharge status: Home / Self Care | Source: Ambulatory Visit | Attending: Obstetrics and Gynecology | Admitting: Obstetrics and Gynecology

## 2015-08-16 ENCOUNTER — Other Ambulatory Visit: Payer: Self-pay

## 2015-08-16 DIAGNOSIS — Z01818 Encounter for other preprocedural examination: Secondary | ICD-10-CM | POA: Diagnosis not present

## 2015-08-16 DIAGNOSIS — N840 Polyp of corpus uteri: Secondary | ICD-10-CM | POA: Diagnosis not present

## 2015-08-16 HISTORY — DX: Type 2 diabetes mellitus without complications: E11.9

## 2015-08-16 LAB — CBC
HCT: 46.3 % — ABNORMAL HIGH (ref 36.0–46.0)
HEMOGLOBIN: 16.1 g/dL — AB (ref 12.0–15.0)
MCH: 29.6 pg (ref 26.0–34.0)
MCHC: 34.8 g/dL (ref 30.0–36.0)
MCV: 85.1 fL (ref 78.0–100.0)
Platelets: 205 10*3/uL (ref 150–400)
RBC: 5.44 MIL/uL — AB (ref 3.87–5.11)
RDW: 12.9 % (ref 11.5–15.5)
WBC: 8.6 10*3/uL (ref 4.0–10.5)

## 2015-08-16 LAB — BASIC METABOLIC PANEL
ANION GAP: 11 (ref 5–15)
BUN: 14 mg/dL (ref 6–20)
CALCIUM: 9.9 mg/dL (ref 8.9–10.3)
CO2: 27 mmol/L (ref 22–32)
Chloride: 101 mmol/L (ref 101–111)
Creatinine, Ser: 0.99 mg/dL (ref 0.44–1.00)
GFR calc Af Amer: >60 mL/min (ref >60)
Glucose, Bld: 97 mg/dL (ref 65–99)
Potassium: 3.4 mmol/L — ABNORMAL LOW (ref 3.5–5.1)
SODIUM: 139 mmol/L (ref 135–145)

## 2015-08-20 ENCOUNTER — Encounter (HOSPITAL_COMMUNITY): Payer: Self-pay | Admitting: Anesthesiology

## 2015-08-20 NOTE — Anesthesia Preprocedure Evaluation (Addendum)
Anesthesia Evaluation  Patient identified by MRN, date of birth, ID band Patient awake    Reviewed: Allergy & Precautions, NPO status , Patient's Chart, lab work & pertinent test results, reviewed documented beta blocker date and time   History of Anesthesia Complications Negative for: history of anesthetic complications  Airway Mallampati: III  TM Distance: >3 FB Neck ROM: Full    Dental no notable dental hx. (+) Dental Advisory Given   Pulmonary former smoker,    Pulmonary exam normal breath sounds clear to auscultation       Cardiovascular hypertension, Pt. on medications and Pt. on home beta blockers Normal cardiovascular exam+ dysrhythmias (atrial tachycardia)  Rhythm:Regular Rate:Normal     Neuro/Psych negative neurological ROS  negative psych ROS   GI/Hepatic Neg liver ROS, GERD  Medicated and Controlled,  Endo/Other  diabetes, Well Controlled, Type 2, Oral Hypoglycemic AgentsHypothyroidism Morbid obesityHyperlipidemia  Renal/GU negative Renal ROS  negative genitourinary   Musculoskeletal negative musculoskeletal ROS (+)   Abdominal   Peds  Hematology negative hematology ROS (+)   Anesthesia Other Findings   Reproductive/Obstetrics negative OB ROS Endometrial Polyp                           Anesthesia Physical Anesthesia Plan  ASA: III  Anesthesia Plan: General   Post-op Pain Management:    Induction: Intravenous  Airway Management Planned: LMA  Additional Equipment:   Intra-op Plan:   Post-operative Plan: Extubation in OR  Informed Consent: I have reviewed the patients History and Physical, chart, labs and discussed the procedure including the risks, benefits and alternatives for the proposed anesthesia with the patient or authorized representative who has indicated his/her understanding and acceptance.   Dental advisory given  Plan Discussed with: CRNA,  Anesthesiologist and Surgeon  Anesthesia Plan Comments:         Anesthesia Quick Evaluation

## 2015-08-21 NOTE — H&P (Signed)
Lori Phelps is an 59 y.o. female G2P2 presents for a scheduled hysteroscopy/polypectomy.  Pt has had off and on PMB for the past few years. She had a SIUS 2014 which showed endometrial polyps/fibroids and was counseled about hysteroscopic removal then but did not bleed further so decided not to do it. EMB then scanty but benign. Began to have hot flashes and bled 7 days in June and then monthly since then she has bled every month for about a week. This month light bleeding for 2 weeks, mild cramps. Recent SIUS 10/16 showed 1.3cm polyp and EMB benign. Recently dx with DM and getting that under control--she has lost about 30 lbs! BS are controlled on meds--metformin BID. More hot flashes lately.  Pertinent Gynecological History: OB History:  NSVD x 2   Menstrual History:  No LMP recorded. Patient is postmenopausal.    Past Medical History  Diagnosis Date  . Hypothyroidism   . Atrial tachycardia (HCC)   . HTN (hypertension)   . HLD (hyperlipidemia)   . Diabetes mellitus without complication [facility](HCC)     Past Surgical History  Procedure Laterality Date  . Back surgery  1994  . Nasal sinus surgery      Family History  Problem Relation Age of Onset  . Kidney disease      Social History:  reports that she has quit smoking. She quit smokeless tobacco use about 22 years ago. She reports that she does not drink alcohol or use illicit drugs.  Allergies:  Allergies  Allergen Reactions  . Penicillins Hives    Has patient had a PCN reaction causing immediate rash, facial/tongue/throat swelling, SOB or lightheadedness with hypotension: No Has patient had a PCN reaction causing severe rash involving mucus membranes or skin necrosis: No Has patient had a PCN reaction that required hospitalization No Has patient had a PCN reaction occurring within the last 10 years: No If all of the above answers are "NO", then may proceed with Cephalosporin use.   . Sulfonamide Derivatives Rash    No  prescriptions prior to admission    Review of Systems  Gastrointestinal: Negative for abdominal pain.    There were no vitals taken for this visit. Physical Exam  Constitutional: She appears well-developed.  Cardiovascular: Normal rate.   Respiratory: Effort normal.  GI: Soft.  Genitourinary: Vagina normal and uterus normal.  Neurological: She is alert.  Psychiatric: She has a normal mood and affect.    No results found for this or any previous visit (from the past 24 hour(s)).  No results found.  Assessment/Plan: The patient was counseled regarding the hysteroscopy procedure in detail. We reviewed risks of bleeding and infection and possible uterine perforation. We also discussed the removal of any identified polyps or submucosal fibroids and what that would involve. The patient desires to proceed. She will use cytotec 400mcg 3 hours prior to the procedure to aid with cervical dilation.   Oliver PilaICHARDSON,[name] 08/21/2015, 5:10 PM

## 2015-08-22 ENCOUNTER — Ambulatory Visit (HOSPITAL_COMMUNITY): Admitting: Anesthesiology

## 2015-08-22 ENCOUNTER — Encounter (HOSPITAL_COMMUNITY)
Admission: RE | Disposition: A | Discharge status: Home / Self Care | Payer: Self-pay | Source: Ambulatory Visit | Attending: Obstetrics and Gynecology

## 2015-08-22 ENCOUNTER — Ambulatory Visit (HOSPITAL_COMMUNITY)
Admission: RE | Admit: 2015-08-22 | Discharge: 2015-08-22 | Disposition: A | Discharge status: Home / Self Care | Source: Ambulatory Visit | Attending: Obstetrics and Gynecology | Admitting: Obstetrics and Gynecology

## 2015-08-22 DIAGNOSIS — N840 Polyp of corpus uteri: Secondary | ICD-10-CM | POA: Insufficient documentation

## 2015-08-22 DIAGNOSIS — E119 Type 2 diabetes mellitus without complications: Secondary | ICD-10-CM | POA: Insufficient documentation

## 2015-08-22 DIAGNOSIS — Z87891 Personal history of nicotine dependence: Secondary | ICD-10-CM | POA: Insufficient documentation

## 2015-08-22 DIAGNOSIS — N8501 Benign endometrial hyperplasia: Secondary | ICD-10-CM | POA: Insufficient documentation

## 2015-08-22 DIAGNOSIS — Z880 Allergy status to penicillin: Secondary | ICD-10-CM | POA: Diagnosis not present

## 2015-08-22 DIAGNOSIS — N950 Postmenopausal bleeding: Secondary | ICD-10-CM | POA: Insufficient documentation

## 2015-08-22 HISTORY — PX: DILATATION & CURETTAGE/HYSTEROSCOPY WITH MYOSURE: SHX6511

## 2015-08-22 LAB — GLUCOSE, CAPILLARY
Glucose-Capillary: 117 mg/dL — ABNORMAL HIGH (ref 65–99)
Glucose-Capillary: 89 mg/dL (ref 65–99)

## 2015-08-22 SURGERY — DILATATION & CURETTAGE/HYSTEROSCOPY WITH MYOSURE
Anesthesia: General | Site: Vagina

## 2015-08-22 MED ORDER — PROPOFOL 10 MG/ML IV BOLUS
INTRAVENOUS | Status: DC | PRN
  Administered 2015-08-22: 150 mg via INTRAVENOUS

## 2015-08-22 MED ORDER — SODIUM CHLORIDE 0.9 % IR SOLN
Status: DC | PRN
  Administered 2015-08-22: 3000 mL

## 2015-08-22 MED ORDER — MIDAZOLAM HCL 2 MG/2ML IJ SOLN
INTRAMUSCULAR | Status: AC
  Filled 2015-08-22: qty 2

## 2015-08-22 MED ORDER — FENTANYL CITRATE (PF) 100 MCG/2ML IJ SOLN
INTRAMUSCULAR | Status: AC
  Filled 2015-08-22: qty 2

## 2015-08-22 MED ORDER — SCOPOLAMINE 1 MG/3DAYS TD PT72
MEDICATED_PATCH | TRANSDERMAL | Status: AC
  Administered 2015-08-22: 1.5 mg via TRANSDERMAL
  Filled 2015-08-22: qty 1

## 2015-08-22 MED ORDER — FENTANYL CITRATE (PF) 100 MCG/2ML IJ SOLN: 25.0000 ug | INTRAMUSCULAR | Status: DC | PRN

## 2015-08-22 MED ORDER — FENTANYL CITRATE (PF) 100 MCG/2ML IJ SOLN
INTRAMUSCULAR | Status: DC | PRN
  Administered 2015-08-22: 25 ug via INTRAVENOUS
  Administered 2015-08-22: 50 ug via INTRAVENOUS
  Administered 2015-08-22: 25 ug via INTRAVENOUS

## 2015-08-22 MED ORDER — LACTATED RINGERS IV SOLN: INTRAVENOUS | Status: DC

## 2015-08-22 MED ORDER — ONDANSETRON HCL 4 MG/2ML IJ SOLN
INTRAMUSCULAR | Status: DC | PRN
  Administered 2015-08-22: 4 mg via INTRAVENOUS

## 2015-08-22 MED ORDER — MIDAZOLAM HCL 2 MG/2ML IJ SOLN
INTRAMUSCULAR | Status: DC | PRN
  Administered 2015-08-22 (×2): 1 mg via INTRAVENOUS

## 2015-08-22 MED ORDER — LIDOCAINE HCL (CARDIAC) 20 MG/ML IV SOLN
INTRAVENOUS | Status: DC | PRN
  Administered 2015-08-22: 60 mg via INTRAVENOUS

## 2015-08-22 MED ORDER — LACTATED RINGERS IV SOLN
INTRAVENOUS | Status: DC
  Administered 2015-08-22: 100 mL/h via INTRAVENOUS

## 2015-08-22 MED ORDER — LIDOCAINE HCL 1 % IJ SOLN
INTRAMUSCULAR | Status: AC
  Filled 2015-08-22: qty 20

## 2015-08-22 MED ORDER — SCOPOLAMINE 1 MG/3DAYS TD PT72
1.0000 | MEDICATED_PATCH | Freq: Once | TRANSDERMAL | Status: DC
  Administered 2015-08-22: 1.5 mg via TRANSDERMAL

## 2015-08-22 MED ORDER — ONDANSETRON HCL 4 MG/2ML IJ SOLN: 4.0000 mg | Freq: Once | INTRAMUSCULAR | Status: DC | PRN

## 2015-08-22 MED ORDER — LIDOCAINE HCL 1 % IJ SOLN
INTRAMUSCULAR | Status: DC | PRN
  Administered 2015-08-22: 20 mL

## 2015-08-22 SURGICAL SUPPLY — 19 items
CANISTER SUCT 3000ML (MISCELLANEOUS) ×3 IMPLANT
CATH ROBINSON RED A/P 16FR (CATHETERS) ×3 IMPLANT
CLOTH BEACON ORANGE TIMEOUT ST (SAFETY) ×3 IMPLANT
CONTAINER PREFILL 10% NBF 60ML (FORM) ×6 IMPLANT
DEVICE MYOSURE LITE (MISCELLANEOUS) ×3 IMPLANT
DEVICE MYOSURE REACH (MISCELLANEOUS) IMPLANT
ELECT REM PT RETURN 9FT ADLT (ELECTROSURGICAL) ×3
ELECTRODE REM PT RTRN 9FT ADLT (ELECTROSURGICAL) ×1 IMPLANT
GLOVE BIO SURGEON STRL SZ 6.5 (GLOVE) ×2 IMPLANT
GLOVE BIO SURGEONS STRL SZ 6.5 (GLOVE) ×1
GLOVE BIOGEL PI IND STRL 7.0 (GLOVE) ×1 IMPLANT
GLOVE BIOGEL PI INDICATOR 7.0 (GLOVE) ×2
GOWN STRL REUS W/TWL LRG LVL3 (GOWN DISPOSABLE) ×6 IMPLANT
PACK VAGINAL MINOR WOMEN LF (CUSTOM PROCEDURE TRAY) ×3 IMPLANT
PAD OB MATERNITY 4.3X12.25 (PERSONAL CARE ITEMS) ×3 IMPLANT
TOWEL OR 17X24 6PK STRL BLUE (TOWEL DISPOSABLE) ×6 IMPLANT
TUBING AQUILEX INFLOW (TUBING) ×3 IMPLANT
TUBING AQUILEX OUTFLOW (TUBING) ×3 IMPLANT
WATER STERILE IRR 1000ML POUR (IV SOLUTION) ×3 IMPLANT

## 2015-08-22 NOTE — Progress Notes (Signed)
Patient ID: Lori Phelps, female   DOB: [birth year], 59 y.o.   MRN: [id number] Pt doing well, BS 116 this AM.  Brief exam WNL.  Ready to proceed.

## 2015-08-22 NOTE — Transfer of Care (Signed)
Immediate Anesthesia Transfer of Care Note  Patient: Lori Phelps  Procedure(s) Performed: Procedure(s): DILATATION & CURETTAGE/HYSTEROSCOPY WITH MYOSURE AND POLYPECTOMY (N/A)  Patient Location: PACU  Anesthesia Type:General  Level of Consciousness: awake, alert  and oriented  Airway & Oxygen Therapy: Patient Spontanous Breathing and Patient connected to nasal cannula oxygen  Post-op Assessment: Report given to RN and Post -op Vital signs reviewed and stable  Post vital signs: Reviewed and stable  Last Vitals:  Filed Vitals:   08/22/15 0659  BP: 157/102  Pulse: 53  Temp: 36.7 C  Resp: 20    Complications: No apparent anesthesia complications

## 2015-08-22 NOTE — Op Note (Signed)
Operative Note    Preoperative Diagnosis Postmenopausal bleeding Endometrial polyps  Postoperative Diagnosis same  Procedure Operative hysteroscopy with polypectomy using Myosure device and endometrial sampling/currettage  Surgeon Huel CoteKathy Quiera Diffee  Anesthesia LMA  Fluids: EBL 50cc UOP 50cc straight cath prior to procedure IVF  1000cc LR Hysteroscopic deficit 100cc  Findings Endometrium appeared a bit proliferative with several polyps identified and removed.  Two in upper right cornual area and one in LUS on left Cavity otherwise WNL  Specimen Endometrial polyps and samplings  Procedure Note Patient was taken to the operating room where LMA anesthesia was obtained without difficulty. She was then prepped and draped in the normal sterile fashion in the dorsal lithotomy position. An appropriate time out was performed. A speculum was then placed within the vagina and the anterior lip of the cervix identified and injected with approximately 2 cc of 1% plain lidocaine. An additional 9 cc each was placed at 2 and 10:00 for a paracervical block. Uterus was then sounded to approximately 8 cm and the Pratt dilators utilized to dilate the cervix up to approximately 24. The Myosure operating scope was then introduced into the cervix and the cavity inspected with findings as previously noted. The Myosure lite operating blade was then introduced through the scope and under direct visualization the polyps were removed in entirety. There was no active bleeding at the conclusion of the removal. The operating scope was then removed from the cervix a small curette inserted into the uterine fundus. A gentle curettage was performed in all 4 quadrants to sample any remaining endometrium. All tissue specimens were handed off to pathology. The tenaculum was then removed from the cervix and the site rendered hemostatic with silver nitrate. Finally the speculum was removed from the vagina and the patient  awakened and taken to the recovery room in good condition.

## 2015-08-22 NOTE — Anesthesia Procedure Notes (Signed)
Procedure Name: LMA Insertion Date/Time: 08/22/2015 8:31 AM Performed by: Shanon PayorGREGORY, Caylor Cerino M Pre-anesthesia Checklist: Patient identified, Emergency Drugs available, Suction available, Timeout performed and Patient being monitored Patient Re-evaluated:Patient Re-evaluated prior to inductionOxygen Delivery Method: Circle system utilized Preoxygenation: Pre-oxygenation with 100% oxygen Intubation Type: IV induction LMA: LMA inserted LMA Size: 4.0 Number of attempts: 1 Placement Confirmation: positive ETCO2 and breath sounds checked- equal and bilateral Tube secured with: Tape Dental Injury: Teeth and Oropharynx as per pre-operative assessment

## 2015-08-22 NOTE — Anesthesia Postprocedure Evaluation (Signed)
Anesthesia Post Note  Patient: Lori Phelps  Procedure(s) Performed: Procedure(s) (LRB): DILATATION & CURETTAGE/HYSTEROSCOPY WITH MYOSURE AND POLYPECTOMY (N/A)  Patient location during evaluation: PACU Anesthesia Type: General Level of consciousness: awake and alert Pain management: pain level controlled Vital Signs Assessment: post-procedure vital signs reviewed and stable Respiratory status: spontaneous breathing, nonlabored ventilation, respiratory function stable and patient connected to nasal cannula oxygen Cardiovascular status: blood pressure returned to baseline and stable Postop Assessment: no signs of nausea or vomiting Anesthetic complications: no    Last Vitals:  Filed Vitals:   08/22/15 0915 08/22/15 0930  BP: 113/71 109/68  Pulse: 55 53  Temp:    Resp: 11 13    Last Pain: There were no vitals filed for this visit.               Raahi Korber JENNETTE

## 2015-08-22 NOTE — Discharge Instructions (Signed)

## 2015-08-23 ENCOUNTER — Encounter (HOSPITAL_COMMUNITY): Payer: Self-pay | Admitting: Obstetrics and Gynecology

## 2015-09-11 ENCOUNTER — Ambulatory Visit (AMBULATORY_SURGERY_CENTER): Payer: Self-pay | Admitting: *Deleted

## 2015-09-11 VITALS — Ht 61.0 in | Wt 217.6 lb

## 2015-09-11 DIAGNOSIS — Z1211 Encounter for screening for malignant neoplasm of colon: Secondary | ICD-10-CM

## 2015-09-11 MED ORDER — NA SULFATE-K SULFATE-MG SULF 17.5-3.13-1.6 GM/177ML PO SOLN: 1.0000 | Freq: Once | ORAL | Status: DC

## 2015-09-11 NOTE — Progress Notes (Signed)
No egg or soy allergy known to patient  No issues with past sedation with any surgeries  or procedures, no intubation problems  No diet pills per patient  No home 02 use per patient   emmi declined  

## 2015-09-25 ENCOUNTER — Encounter: Payer: Self-pay | Admitting: Internal Medicine

## 2015-09-25 ENCOUNTER — Ambulatory Visit (AMBULATORY_SURGERY_CENTER): Admitting: Internal Medicine

## 2015-09-25 VITALS — BP 116/72 | HR 58 | Temp 97.2°F | Resp 22 | Ht 61.0 in | Wt 217.0 lb

## 2015-09-25 DIAGNOSIS — Z1211 Encounter for screening for malignant neoplasm of colon: Secondary | ICD-10-CM

## 2015-09-25 MED ORDER — SODIUM CHLORIDE 0.9 % IV SOLN: 500.0000 mL | INTRAVENOUS | Status: DC

## 2015-09-25 NOTE — Progress Notes (Signed)
Patient awakening,vss,report to rn 

## 2015-09-25 NOTE — Patient Instructions (Addendum)
YOU HAD AN ENDOSCOPIC PROCEDURE TODAY AT THE [facility]:   Refer to the procedure report that was given to you for any specific questions about what was found during the examination.  If the procedure report does not answer your questions, please call your gastroenterologist to clarify.  If you requested that your care partner not be given the details of your procedure findings, then the procedure report has been included in a sealed envelope for you to review at your convenience later.  YOU SHOULD EXPECT: Some feelings of bloating in the abdomen. Passage of more gas than usual.  Walking can help get rid of the air that was put into your GI tract during the procedure and reduce the bloating. If you had a lower endoscopy (such as a colonoscopy or flexible sigmoidoscopy) you may notice spotting of blood in your stool or on the toilet paper. If you underwent a bowel prep for your procedure, you may not have a normal bowel movement for a few days.  Please Note:  You might notice some irritation and congestion in your nose or some drainage.  This is from the oxygen used during your procedure.  There is no need for concern and it should clear up in a day or so.  SYMPTOMS TO REPORT IMMEDIATELY:   Following lower endoscopy (colonoscopy or flexible sigmoidoscopy):  Excessive amounts of blood in the stool  Significant tenderness or worsening of abdominal pains  Swelling of the abdomen that is new, acute  Fever of 100F or higher   For urgent or emergent issues, a gastroenterologist can be reached at any hour by calling [phone].   DIET: Your first meal following the procedure should be a small meal and then it is ok to progress to your normal diet. Heavy or fried foods are harder to digest and may make you feel nauseous or bloated.  Likewise, meals heavy in dairy and vegetables can increase bloating.  Drink plenty of fluids but you should avoid alcoholic beverages for 24 hours.  Try to  eat a high fiber diet due to your hemorrhoids. Read handouts.  ACTIVITY:  You should plan to take it easy for the rest of today and you should NOT DRIVE or use heavy machinery until tomorrow (because of the sedation medicines used during the test).    FOLLOW UP: Our staff will call the number listed on your records the next business day following your procedure to check on you and address any questions or concerns that you may have regarding the information given to you following your procedure. If we do not reach you, we will leave a message.  However, if you are feeling well and you are not experiencing any problems, there is no need to return our call.  We will assume that you have returned to your regular daily activities without incident.   SIGNATURES/CONFIDENTIALITY: You and/or your care partner have signed paperwork which will be entered into your electronic medical record.  These signatures attest to the fact that that the information above on your After Visit Summary has been reviewed and is understood.  Full responsibility of the confidentiality of this discharge information lies with you and/or your care-partner.  Thank-you for choosing [place] for your healthcare needs.

## 2015-09-25 NOTE — Op Note (Addendum)
[facility] [address] [place], [address]   COLONOSCOPY PROCEDURE REPORT  PATIENT: Lori Phelps, Lori Phelps  MR#: [id number] BIRTHDATE: 08-02-1956 , 59  yrs. old GENDER: female ENDOSCOPIST: Beverley Fiedler, MD REFERRED [id number] Marina Goodell, M.D. PROCEDURE DATE:  09/25/2015 PROCEDURE:   Colonoscopy, screening First Screening Colonoscopy - Avg.  risk and is 50 yrs.  old or older Yes.  Prior Negative Screening - Now for repeat screening. N/A  History of Adenoma - Now for follow-up colonoscopy & has been > or = to 3 yrs.  N/A  Polyps removed today? No Recommend repeat exam, <10 yrs? No ASA CLASS:   Class III INDICATIONS:Screening for colonic neoplasia, Colorectal Neoplasm Risk Assessment for this procedure is average risk, and remote colonoscopy performed in 2002 for iron deficiency by Dr.  Jarold Motto (normal). MEDICATIONS: Monitored anesthesia care and Propofol 250 mg IV  DESCRIPTION OF PROCEDURE:   After the risks benefits and alternatives of the procedure were thoroughly explained, informed consent was obtained.  The digital rectal exam revealed hemorrhoids, Grade IV.   The LB [id number]  endoscope was introduced through the anus and advanced to the cecum, which was identified by both the appendix and ileocecal valve. No adverse events experienced.   The quality of the prep was good.  (Suprep was used)  The instrument was then slowly withdrawn as the colon was fully examined. Estimated blood loss is zero unless otherwise noted in this procedure report.      COLON FINDINGS: A normal appearing cecum, ileocecal valve, and appendiceal orifice were identified.  The ascending, transverse, descending, sigmoid colon, and rectum appeared unremarkable. Retroflexed views revealed internal hemorrhoids. The time to cecum = 2.0 Withdrawal time = 9.0   The scope was withdrawn and the procedure completed.  COMPLICATIONS: There were no immediate complications.  ENDOSCOPIC  IMPRESSION: Normal colonoscopy  RECOMMENDATIONS: You should continue to follow colorectal cancer screening guidelines for "routine risk" patients with a repeat colonoscopy in 10 years. There is no need for FOBT (stool) testing for at least 5 years.  eSigned:  Beverley Fiedler, MD 09/25/2015 11:27 AM   cc:  the patient, Dr. Brigitte Pulse, Dr. Huel Cote   PATIENT NAME:  Lori Phelps, Lori Phelps MR#: [id number]

## 2015-09-26 ENCOUNTER — Telehealth: Payer: Self-pay | Admitting: Emergency Medicine

## 2015-09-26 NOTE — Telephone Encounter (Signed)
  Follow up Call-  Call back number 09/25/2015  Post procedure Call Back phone  #  [phone]  Permission to leave phone message Yes     Patient questions:  Do you have a fever, pain , or abdominal swelling? No. Pain Score  0 *  Have you tolerated food without any problems? Yes.    Have you been able to return to your normal activities? Yes.    Do you have any questions about your discharge instructions: Diet   No. Medications  No. Follow up visit  No.  Do you have questions or concerns about your Care? No.  Actions: * If pain score is 4 or above: No action needed, pain <4.

## 2016-03-18 ENCOUNTER — Ambulatory Visit (INDEPENDENT_AMBULATORY_CARE_PROVIDER_SITE_OTHER): Admitting: Cardiovascular Disease

## 2016-03-18 VITALS — BP 139/82 | HR 60 | Ht 61.0 in | Wt 187.4 lb

## 2016-03-18 DIAGNOSIS — R002 Palpitations: Secondary | ICD-10-CM

## 2016-03-18 DIAGNOSIS — R0600 Dyspnea, unspecified: Secondary | ICD-10-CM

## 2016-03-18 NOTE — Progress Notes (Signed)
Cardiology Office Note   Date:  03/18/2016   ID:  Lori Phelps, DOB 11-20-1955, MRN [id number]  PCP:  Abigail MiyamotoPERRY,LAWRENCE EDWARD, MD  Cardiologist:   Lorine BearsMuhammad Neithan Day, MD   Chief Complaint  Patient presents with  . Follow-up    pt c/o fluttering in chest      History of Present Illness: Lori Primaatti M Pinela is a 60 y.o. female who presents for A follow-up visit regarding palpitations. She was most recently seen by me in 04/15/2013.  She has a history of atrial tachycardia and frequent PACs. She was also evaluated by Dr. Graciela HusbandsKlein in the past.  Echocardiogram in 2011 showed normal LV systolic function, no significant valvular abnormalities and normal left atrial size. She did have exertional dyspnea and underwent a nuclear stress test in 2011 which showed no evidence of ischemia. She has been treated with metoprolol. She has known history of hypothyroidism on replacement. She reports having complete labs on June 30 which were completely normal. She was diagnosed with type 2 diabetes in October 2016 with a hemoglobin A1c of 11. Since then, she worked on diet and exercise and lost 70 pounds with improvement and hemoglobin A1c to 5.7. Her weight in 2014 was 260 pounds. Over the last week, she has experienced frequent palpitations associated with mild dizziness and increased shortness of breath without chest pain. No syncope or presyncope. She does not feel that she is having tachycardia or racing heart. She consumes one cup of coffee a day. No excessive caffeine intake.   Past Medical History  Diagnosis Date  . Hypothyroidism   . Atrial tachycardia (HCC)   . HTN (hypertension)   . HLD (hyperlipidemia)   . Diabetes mellitus without complication (HCC)   . Hx of bronchitis     uses MDI prn and on advair daily but no dx of asthma   . Palpitations     hx PAC"S    Past Surgical History  Procedure Laterality Date  . Back surgery  1994  . Nasal sinus surgery      [id number]  . Dilatation &  curettage/hysteroscopy with myosure N/A 08/22/2015    Procedure: DILATATION & CURETTAGE/HYSTEROSCOPY WITH MYOSURE AND POLYPECTOMY;  Surgeon: Huel CoteKathy Richardson, MD;  Location: [facility];  Service: Gynecology;  Laterality: N/A;  . Colonoscopy  2002     Current Outpatient Prescriptions  Medication Sig Dispense Refill  . albuterol (PROVENTIL HFA;VENTOLIN HFA) 108 (90 BASE) MCG/ACT inhaler Inhale 2 puffs into the lungs every 6 (six) hours as needed for wheezing or shortness of breath.    Marland Kitchen. amLODipine (NORVASC) 10 MG tablet Take 1 tablet (10 mg total) by mouth daily. 90 tablet 2  . aspirin 325 MG tablet Take 325 mg by mouth daily.      . Fluticasone-Salmeterol (ADVAIR DISKUS) 250-50 MCG/DOSE AEPB Inhale 1 puff into the lungs every 12 (twelve) hours.      . hydrochlorothiazide 25 MG tablet Take 1 tablet (25 mg total) by mouth daily. 90 tablet 2  . levothyroxine (SYNTHROID, LEVOTHROID) 25 MCG tablet Take 25 mcg by mouth daily.      . metFORMIN (GLUCOPHAGE) 500 MG tablet Take 500 mg by mouth 2 (two) times daily with a meal.    . metoprolol (LOPRESSOR) 50 MG tablet Take 1 tablet (50 mg) twice daily and take an extra dose as needed for palpitations & tachycardia 270 tablet 2  . omeprazole (PRILOSEC) 20 MG capsule Take 20 mg by mouth daily.      .Marland Kitchen  simvastatin (ZOCOR) 20 MG tablet Take 1 tablet (20 mg total) by mouth at bedtime. 90 tablet 2   No current facility-administered medications for this visit.    Allergies:   Penicillins and Sulfonamide derivatives    Social History:  The patient  reports that she has quit smoking. She has never used smokeless tobacco. She reports that she does not drink alcohol or use illicit drugs.   Family History:  The patient's family history is negative for Colon cancer, Colon polyps, Esophageal cancer, Rectal cancer, and Stomach cancer.    ROS:  Please see the history of present illness.   Otherwise, review of systems are positive for none.   All other systems are  reviewed and negative.    PHYSICAL EXAM: VS:  BP 139/82 mmHg  Pulse 60  Ht  (1.549 m)  Wt 187 lb 6.4 oz (85.004 kg)  BMI 35.43 kg/m2  LMP 05/21/2015 , BMI Body mass index is 35.43 kg/(m^2). GEN: Well nourished, well developed, in no acute distress HEENT: normal Neck: no JVD, carotid bruits, or masses Cardiac: RRR; no murmurs, rubs, or gallops,no edema  Respiratory:  clear to auscultation bilaterally, normal work of breathing GI: soft, nontender, nondistended, + BS MS: no deformity or atrophy Skin: warm and dry, no rash Neuro:  Strength and sensation are intact Psych: euthymic mood, full affect   EKG:  EKG is ordered today. The ekg ordered today demonstrates review normal sinus rhythm with no significant ST or T wave changes.   Recent Labs: 08/16/2015: BUN 14; Creatinine, Ser 0.99; Hemoglobin 16.1*; Platelets 205; Potassium 3.4*; Sodium 139    Lipid Panel No results found for: CHOL, TRIG, HDL, CHOLHDL, VLDL, LDLCALC, LDLDIRECT    Wt Readings from Last 3 Encounters:  03/18/16 187 lb 6.4 oz (85.004 kg)  09/25/15 217 lb (98.431 kg)  09/11/15 217 lb 9.6 oz (98.703 kg)       ASSESSMENT AND PLAN:  1.  Symptomatic PACs: The patient reports worsening  palpitations recently in spite of treatment with metoprolol . She increased the dose to 3 times daily as needed with no significant improvement. She does not consume excessive amount of caffeine and recent labs were unremarkable according to her. I requested a 48-hour Holter monitor. She also reports associated dyspnea and thus I requested an echocardiogram to ensure no structural heart abnormalities.  2. Essential hypertension: Blood pressure is reasonably controlled on current medications.  3. Diabetes mellitus: Significant improvement with lifestyle changes.    Disposition:   FU with me in 1 year  Signed,  Lorine Bears, MD  03/18/2016 10:23 AM    [facility]

## 2016-03-18 NOTE — Patient Instructions (Signed)
Medication Instructions:  Continue current medications  Labwork: NONE  Testing/Procedures: Your physician has requested that you have an echocardiogram. Echocardiography is a painless test that uses sound waves to create images of your heart. It provides your doctor with information about the size and shape of your heart and how well your heart's chambers and valves are working. This procedure takes approximately one hour. There are no restrictions for this procedure.  Your physician has recommended that you wear a holter monitor for 48 hours. Holter monitors are medical devices that record the heart's electrical activity. Doctors most often use these monitors to diagnose arrhythmias. Arrhythmias are problems with the speed or rhythm of the heartbeat. The monitor is a small, portable device. You can wear one while you do your normal daily activities. This is usually used to diagnose what is causing palpitations/syncope (passing out).  Follow-Up: Your physician wants you to follow-up in: 1 Year. You will receive a reminder letter in the mail two months in advance. If you don't receive a letter, please call our office to schedule the follow-up appointment.   Any Other Special Instructions Will Be Listed Below (If Applicable).   If you need a refill on your cardiac medications before your next appointment, please call your pharmacy.

## 2016-03-19 ENCOUNTER — Other Ambulatory Visit: Payer: Self-pay

## 2016-03-19 ENCOUNTER — Ambulatory Visit (INDEPENDENT_AMBULATORY_CARE_PROVIDER_SITE_OTHER)

## 2016-03-19 ENCOUNTER — Ambulatory Visit (HOSPITAL_COMMUNITY): Attending: Cardiology

## 2016-03-19 DIAGNOSIS — R002 Palpitations: Secondary | ICD-10-CM | POA: Insufficient documentation

## 2016-03-19 DIAGNOSIS — R0600 Dyspnea, unspecified: Secondary | ICD-10-CM | POA: Insufficient documentation

## 2016-03-19 DIAGNOSIS — E119 Type 2 diabetes mellitus without complications: Secondary | ICD-10-CM | POA: Insufficient documentation

## 2016-03-19 DIAGNOSIS — I119 Hypertensive heart disease without heart failure: Secondary | ICD-10-CM | POA: Diagnosis not present

## 2016-03-19 DIAGNOSIS — E785 Hyperlipidemia, unspecified: Secondary | ICD-10-CM | POA: Insufficient documentation

## 2016-04-09 ENCOUNTER — Institutional Professional Consult (permissible substitution): Admitting: Internal Medicine

## 2016-04-23 ENCOUNTER — Encounter: Payer: Self-pay | Admitting: Internal Medicine

## 2016-04-23 ENCOUNTER — Ambulatory Visit (INDEPENDENT_AMBULATORY_CARE_PROVIDER_SITE_OTHER): Admitting: Internal Medicine

## 2016-04-23 VITALS — BP 140/84 | HR 65 | Ht 61.0 in | Wt 188.6 lb

## 2016-04-23 DIAGNOSIS — R002 Palpitations: Secondary | ICD-10-CM | POA: Diagnosis not present

## 2016-04-23 DIAGNOSIS — I471 Supraventricular tachycardia: Secondary | ICD-10-CM

## 2016-04-23 MED ORDER — DILTIAZEM HCL ER COATED BEADS 120 MG PO CP24: 120.0000 mg | ORAL_CAPSULE | Freq: Every day | ORAL | 3 refills | Status: DC

## 2016-04-23 NOTE — Progress Notes (Signed)
Electrophysiology Office Note   Date:  04/23/2016   ID:  Lori Primaatti M Marlar, DOB [birth year], MRN [id number]  PCP:  Abigail MiyamotoPERRY,LAWRENCE EDWARD, MD  Cardiologist:  Dr Kirke CorinArida Primary Electrophysiologist: Dr Graciela HusbandsKlein  (years ago)  Chief Complaint  Patient presents with  . Atrial Fibrillation     History of Present Illness: Lori Phelps is a 60 y.o. female who presents today for electrophysiology evaluation.   She reports having papitations "for years".  She was seen previously by Dr Graciela HusbandsKlein who felt that at that time she did not have afib.  She was placed on atenolol.  SHe was eventually switched to metoprolol.  She has worked diligently to lose weight and has lost over 75 lbs.  She is active at the gym.  She did very well and did not have any palpitations until recently.  Over the past month, she has noticed increased palpitations.  She describes these as short "skips" of her heart beat.  She feels that they are sporadic and does not feel that she is having a sustained arrhythmia. She recently was evaluated by Dr Kirke CorinArida and had a holter monitor placed.  This revealed frequent PACs and nonsustained atrial tachycardia as the source for her symptoms.  She did have an episode of nonsustained afib.  She also had nocturnal sinus bradycardia with accelerated junctional rhythm and ideoventricular rhythm observed.  Today, she denies symptoms of chest pain, shortness of breath, orthopnea, PND, lower extremity edema, claudication, dizziness, presyncope, syncope, bleeding, or neurologic sequela. The patient is tolerating medications without difficulties and is otherwise without complaint today.    Past Medical History:  Diagnosis Date  . Atrial tachycardia (HCC)   . Diabetes mellitus without complication (HCC)   . HLD (hyperlipidemia)   . HTN (hypertension)   . Hx of bronchitis    uses MDI prn and on advair daily but no dx of asthma   . Hypothyroidism   . Overweight   . Palpitations    hx PAC"S   Past  Surgical History:  Procedure Laterality Date  . BACK SURGERY  1994  . COLONOSCOPY  2002  . DILATATION & CURETTAGE/HYSTEROSCOPY WITH MYOSURE N/A 08/22/2015   Procedure: DILATATION & CURETTAGE/HYSTEROSCOPY WITH MYOSURE AND POLYPECTOMY;  Surgeon: Huel CoteKathy Richardson, MD;  Location: [facility];  Service: Gynecology;  Laterality: N/A;  . NASAL SINUS SURGERY     [id number]     Current Outpatient Prescriptions  Medication Sig Dispense Refill  . albuterol (PROVENTIL HFA;VENTOLIN HFA) 108 (90 BASE) MCG/ACT inhaler Inhale 2 puffs into the lungs every 6 (six) hours as needed for wheezing or shortness of breath.    Marland Kitchen. amLODipine (NORVASC) 10 MG tablet Take 1 tablet (10 mg total) by mouth daily. 90 tablet 2  . Ascorbic Acid (VITAMIN C PO) Take 1 tablet by mouth daily.    Marland Kitchen. aspirin 325 MG tablet Take 325 mg by mouth daily.      . Fluticasone-Salmeterol (ADVAIR DISKUS) 250-50 MCG/DOSE AEPB Inhale 1 puff into the lungs every 12 (twelve) hours.      . hydrochlorothiazide 25 MG tablet Take 1 tablet (25 mg total) by mouth daily. 90 tablet 2  . levothyroxine (SYNTHROID, LEVOTHROID) 25 MCG tablet Take 25 mcg by mouth daily.      . metFORMIN (GLUCOPHAGE) 500 MG tablet Take 500 mg by mouth 2 (two) times daily with a meal.    . metoprolol (LOPRESSOR) 50 MG tablet Take 1 tablet (50 mg) twice daily and take an extra dose as  needed for palpitations & tachycardia 270 tablet 2  . Multiple Vitamin (MULTIVITAMIN) capsule Take 1 capsule by mouth daily.    . Omega-3 Fatty Acids (FISH OIL PO) Take 2 capsules by mouth daily.    Marland Kitchen. omeprazole (PRILOSEC) 20 MG capsule Take 20 mg by mouth daily.      . simvastatin (ZOCOR) 20 MG tablet Take 1 tablet (20 mg total) by mouth at bedtime. 90 tablet 2   No current facility-administered medications for this visit.     Allergies:   Penicillins and Sulfonamide derivatives   Social History:  The patient  reports that she has quit smoking. She has never used smokeless tobacco. She reports that  she does not drink alcohol or use drugs.   Family History:  The patient reports multiple family members with Afib   ROS:  Please see the history of present illness.   All other systems are reviewed and negative.    PHYSICAL EXAM: VS:  BP 140/84   Pulse 65   Ht 5\' 1"  (1.549 m)   Wt 188 lb 9.6 oz (85.5 kg)   LMP 05/21/2015 Comment: irregular since age 60   BMI 35.64 kg/m  , BMI Body mass index is 35.64 kg/m. GEN: Well nourished, well developed, in no acute distress  HEENT: normal  Neck: no JVD, carotid bruits, or masses Cardiac: RRR; no murmurs, rubs, or gallops,no edema  Respiratory:  clear to auscultation bilaterally, normal work of breathing GI: soft, nontender, nondistended, + BS MS: no deformity or atrophy  Skin: warm and dry  Neuro:  Strength and sensation are intact Psych: euthymic mood, full affect  EKG:  EKG is ordered today. The ekg ordered today shows sinus rhythm 65 bpm, PR 138 msec, QRS 80 msec, Qtc 447 msec, otherwise normal ekg   Recent Labs: 08/16/2015: BUN 14; Creatinine, Ser 0.99; Hemoglobin 16.1; Platelets 205; Potassium 3.4; Sodium 139    Lipid Panel  No results found for: CHOL, TRIG, HDL, CHOLHDL, VLDL, LDLCALC, LDLDIRECT   Wt Readings from Last 3 Encounters:  04/23/16 188 lb 9.6 oz (85.5 kg)  03/18/16 187 lb 6.4 oz (85 kg)  09/25/15 217 lb (98.4 kg)      Other studies Reviewed: Additional studies/ records that were reviewed today include:  Dr Sheilah PigeonAridas notes, prior echo, event monitor  Review of the above records today demonstrates: EF 60%, mild LVH (<1.5cm), no significant valvular disease, LA 39 mm   ASSESSMENT AND PLAN:  1.  PACs, nonsustained atach, nonsustained afib The patient has symptomatic atrial arrhythmias.  She has failed medical therapy with metoprolol. At this time, I will stop metoprolol and place on dlitiazem CD 120mg  daily.  This can be increased to 240mg  daily if needed.  If she fails medical therapy with diltiazem, would  consider flecainide 50mg  BID as out next option. Given short episodes, I would not favor anticoagulation at this time  2. Obesity Remarkable response to lifestyle change I have commended her on her efforts   Follow-up:  EP NP in 2 weeks I will see again in 6 weeks  Current medicines are reviewed at length with the patient today.   The patient does not have concerns regarding her medicines.  The following changes were made today:  none  Signed, Hillis RangeJames Conway Fedora, MD  04/23/2016 3:35 PM     [facility] [address] [place] [address] [phone] (office) [phone] (fax)

## 2016-04-23 NOTE — Patient Instructions (Signed)
Medication Instructions:  Your physician has recommended you make the following change in your medication:  1) Stop Metoprolol 2) Start Diltiazem 120 mg daily    Labwork: None ordered   Testing/Procedures: None ordered   Follow-Up: Your physician recommends that you schedule a follow-up appointment in: 2 weeks with Gypsy BalsamAmber Seiler, NP and 6 weeks with Dr. Johney FrameAllred   Any Other Special Instructions Will Be Listed Below (If Applicable).     If you need a refill on your cardiac medications before your next appointment, please call your pharmacy.

## 2016-04-29 ENCOUNTER — Encounter: Payer: Self-pay | Admitting: Internal Medicine

## 2016-04-30 ENCOUNTER — Encounter: Payer: Self-pay | Admitting: Internal Medicine

## 2016-05-01 ENCOUNTER — Telehealth: Payer: Self-pay | Admitting: Internal Medicine

## 2016-05-01 MED ORDER — DILTIAZEM HCL ER COATED BEADS 240 MG PO CP24: 240.0000 mg | ORAL_CAPSULE | Freq: Every day | ORAL | 3 refills | Status: DC

## 2016-05-01 NOTE — Telephone Encounter (Signed)
New message       Pt states that she was taken off the metoprolol and gave her a new prescription. She says that her heart rate has increased over 125. She does not feel well. Her heart is pounding and it skips. She states the medicine is not helping.    Patient c/o Palpitations:  High priority if patient c/o lightheadedness and shortness of breath.  1. How long have you been having palpitations? All week   2. Are you currently experiencing lightheadedness and shortness of breath?no  3. Have you checked your BP and heart rate? (document readings) not checked BP but heart rate is at 89 while siting waiting to go into the dentist.   4. Are you experiencing any other symptoms? SOB when exercising cause heart rate increased     Pt states she is getting ready to have a crown at the dentist and she will be unavailable for about an hour but you can leave her a message.

## 2016-05-01 NOTE — Addendum Note (Signed)
Addended by: Dennis BastLANIER, Oran Dillenburg F on: 05/01/2016 02:29 PM   Modules accepted: Orders

## 2016-05-01 NOTE — Telephone Encounter (Signed)
Spoke with patient and her resting HR's are around 100.  When she exercises gets up to 125-130.  Dr Jenel LucksAllred's note says okay to increase the Diltiazem if needed.  Will increase to 240mg  daily and she will let me know next week if she is feeling better

## 2016-05-03 ENCOUNTER — Other Ambulatory Visit: Payer: Self-pay | Admitting: Physician Assistant

## 2016-05-03 MED ORDER — METOPROLOL TARTRATE 25 MG PO TABS
25.0000 mg | ORAL_TABLET | Freq: Two times a day (BID) | ORAL | 11 refills | Status: DC
Start: 2016-05-03 — End: 2017-02-23

## 2016-05-03 NOTE — Progress Notes (Signed)
Patient states that since she was taken off the metoprolol, her heart skips have decreased a little but not very much. Her resting heart rate is too high. She feels that she cannot get up and walk across the room without her heart rate going to 130. She was to go back on the metoprolol.  Advised her that she could continue the diltiazem at the current rate, and restart metoprolol at 25 mg twice a day. She has 50 mg tablets and will cut them in half. I advised her to keep track of her heart rate and blood pressure and if either one started going too low, cut back the Cardizem. She greatly prefers the metoprolol for heart rate and blood pressure control. Prescription was sent in for the metoprolol.  Theodore DemarkBarrett, Archana Eckman, PA-C 05/03/2016 1:07 PM Beeper [phone]

## 2016-05-09 ENCOUNTER — Encounter: Payer: Self-pay | Admitting: Nurse Practitioner

## 2016-05-09 ENCOUNTER — Ambulatory Visit (INDEPENDENT_AMBULATORY_CARE_PROVIDER_SITE_OTHER): Admitting: Nurse Practitioner

## 2016-05-09 VITALS — BP 130/76 | HR 57 | Ht 61.0 in | Wt 184.4 lb

## 2016-05-09 DIAGNOSIS — I471 Supraventricular tachycardia: Secondary | ICD-10-CM

## 2016-05-09 DIAGNOSIS — I10 Essential (primary) hypertension: Secondary | ICD-10-CM | POA: Diagnosis not present

## 2016-05-09 LAB — BASIC METABOLIC PANEL
BUN: 14 mg/dL (ref 7–25)
CALCIUM: 10 mg/dL (ref 8.6–10.4)
CO2: 29 mmol/L (ref 20–31)
Chloride: 103 mmol/L (ref 98–110)
Creat: 0.72 mg/dL (ref 0.50–1.05)
GLUCOSE: 92 mg/dL (ref 65–99)
Potassium: 4.2 mmol/L (ref 3.5–5.3)
SODIUM: 142 mmol/L (ref 135–146)

## 2016-05-09 MED ORDER — FUROSEMIDE 20 MG PO TABS: 20.0000 mg | ORAL_TABLET | Freq: Every day | ORAL | 0 refills | Status: DC

## 2016-05-09 MED ORDER — FUROSEMIDE 20 MG PO TABS: 20.0000 mg | ORAL_TABLET | Freq: Every day | ORAL | 3 refills | Status: DC

## 2016-05-09 MED ORDER — DILTIAZEM HCL ER COATED BEADS 240 MG PO CP24: 240.0000 mg | ORAL_CAPSULE | Freq: Every day | ORAL | 3 refills | Status: DC

## 2016-05-09 NOTE — Progress Notes (Signed)
Electrophysiology Office Note Date: 05/09/2016  ID:  Lori Phelps, DOB 1956-07-20, MRN [id number]  PCP: Abigail Miyamoto, MD Primary Cardiologist: Kirke Corin Electrophysiologist: Allred  CC: follow up atrial arrhythmias  Lori Phelps is a 60 y.o. female seen today for Dr Johney Frame.  She presents today for routine electrophysiology followup.  She was found recently to have symptomatic PAC's, atrial tachycardia and non sustained atrial fibrillation on holter monitor that was not responsive to BB therapy. She was placed on Diltiazem at last visit and presents today for follow up. Since last being seen in our clinic, the patient reports doing very well.  Her palpitations have significantly improved. She does have LE edema related to diltiazem.  She denies chest pain, palpitations, dyspnea, PND, orthopnea, nausea, vomiting, dizziness, syncope, weight gain, or early satiety.  Past Medical History:  Diagnosis Date  . Atrial tachycardia (HCC)   . Diabetes mellitus without complication (HCC)   . HLD (hyperlipidemia)   . HTN (hypertension)   . Hx of bronchitis    uses MDI prn and on advair daily but no dx of asthma   . Hypothyroidism   . Overweight   . Palpitations    hx PAC"S   Past Surgical History:  Procedure Laterality Date  . BACK SURGERY  1994  . COLONOSCOPY  2002  . DILATATION & CURETTAGE/HYSTEROSCOPY WITH MYOSURE N/A 08/22/2015   Procedure: DILATATION & CURETTAGE/HYSTEROSCOPY WITH MYOSURE AND POLYPECTOMY;  Surgeon: Huel Cote, MD;  Location: [facility];  Service: Gynecology;  Laterality: N/A;  . NASAL SINUS SURGERY     [id number]    Current Outpatient Prescriptions  Medication Sig Dispense Refill  . albuterol (PROVENTIL HFA;VENTOLIN HFA) 108 (90 BASE) MCG/ACT inhaler Inhale 2 puffs into the lungs every 6 (six) hours as needed for wheezing or shortness of breath.    . Ascorbic Acid (VITAMIN C PO) Take 1 tablet by mouth daily.    Marland Kitchen aspirin 325 MG tablet Take 325 mg by mouth  daily.      Marland Kitchen diltiazem (CARDIZEM CD) 240 MG 24 hr capsule Take 1 capsule (240 mg total) by mouth daily. 90 capsule 3  . Fluticasone-Salmeterol (ADVAIR DISKUS) 250-50 MCG/DOSE AEPB Inhale 1 puff into the lungs every 12 (twelve) hours.      Marland Kitchen levothyroxine (SYNTHROID, LEVOTHROID) 25 MCG tablet Take 25 mcg by mouth daily.      . metFORMIN (GLUCOPHAGE) 500 MG tablet Take 500 mg by mouth 2 (two) times daily with a meal.    . metoprolol tartrate (LOPRESSOR) 25 MG tablet Take 1 tablet (25 mg total) by mouth 2 (two) times daily. 60 tablet 11  . Multiple Vitamin (MULTIVITAMIN) capsule Take 1 capsule by mouth daily.    . Omega-3 Fatty Acids (FISH OIL PO) Take 2 capsules by mouth daily.    Marland Kitchen omeprazole (PRILOSEC) 20 MG capsule Take 20 mg by mouth daily.      . simvastatin (ZOCOR) 20 MG tablet Take 1 tablet (20 mg total) by mouth at bedtime. 90 tablet 2  . furosemide (LASIX) 20 MG tablet Take 1 tablet (20 mg total) by mouth daily. 30 tablet 0   No current facility-administered medications for this visit.     Allergies:   Penicillins and Sulfonamide derivatives   Social History: Social History   Social History  . Marital status: Married    Spouse name: N/A  . Number of children: 2  . Years of education: N/A   Occupational History  . Not on  file.   Social History Main Topics  . Smoking status: Former Games developermoker  . Smokeless tobacco: Never Used  . Alcohol use No  . Drug use: No  . Sexual activity: Not on file   Other Topics Concern  . Not on file   Social History Narrative   [place] in [place].  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    Family History: Family History  Problem Relation Age of Onset  . Kidney disease    . Kidney disease Mother   . Heart failure Father   . Colon cancer Neg Hx   . Colon polyps Neg Hx   . Esophageal cancer Neg Hx   . Rectal cancer Neg Hx   . Stomach cancer Neg Hx     Review of Systems: All other systems reviewed and  are otherwise negative except as noted above.   Physical Exam: VS:  BP 130/76   Pulse (!) 57   Ht 5\' 1"  (1.549 m)   Wt 184 lb 6.4 oz (83.6 kg)   LMP 05/21/2015 Comment: irregular since age 60   BMI 34.84 kg/m  , BMI Body mass index is 34.84 kg/m. Wt Readings from Last 3 Encounters:  05/09/16 184 lb 6.4 oz (83.6 kg)  04/23/16 188 lb 9.6 oz (85.5 kg)  03/18/16 187 lb 6.4 oz (85 kg)    GEN- The patient is well appearing, alert and oriented x 3 today.   HEENT: normocephalic, atraumatic; sclera clear, conjunctiva pink; hearing intact; oropharynx clear; neck supple  Lungs- Clear to ausculation bilaterally, normal work of breathing.  No wheezes, rales, rhonchi Heart- Regular rate and rhythm  GI- soft, non-tender, non-distended, bowel sounds present  Extremities- no clubbing, cyanosis, +BLE dependent edema MS- no significant deformity or atrophy Skin- warm and dry, no rash or lesion  Psych- euthymic mood, full affect Neuro- strength and sensation are intact   EKG:  EKG is not ordered today.  Recent Labs: 08/16/2015: BUN 14; Creatinine, Ser 0.99; Hemoglobin 16.1; Platelets 205; Potassium 3.4; Sodium 139    Other studies Reviewed: Additional studies/ records that were reviewed today include: Dr Jenel LucksAllred's office notes  Assessment and Plan: 1.  PAC's, atrial tachycardia, non sustained AF Improved with Diltiazem but with resultant LE edema We discussed starting Flecainide today, but she feels that since palpitations are improved on current therapy she would like to continue Will stop HCTZ and amlodipine, start Furosemide 20mg  daily for edema and BP control and follow for now BMET today  2.  HTN Changes as above Pt will monitor blood pressure at home and call with problems     Current medicines are reviewed at length with the patient today.   The patient does not have concerns regarding her medicines.  The following changes were made today:  Stop amlodipine, stop HCTZ, start  Furosemide 20mg  daily  Labs/ tests ordered today include: BMET Orders Placed This Encounter  Procedures  . Basic metabolic panel  . HgB A1c     Disposition:   Follow up with Dr Johney FrameAllred as scheduled     Signed, Gypsy BalsamAmber Tehani Mersman, NP 05/09/2016 9:15 AM   [facility] [address] [place] [address] [phone] (office) [phone] (fax)

## 2016-05-09 NOTE — Patient Instructions (Signed)
Medication Instructions:  Your physician has recommended you make the following change in your medication: 1) STOP Amlodipine 2) STOP HCTZ 3) START Lasix 20mg  daily. An Rx has been sent to your pharmacy  An Rx for Diltiazem 240mg  has been sent to your pharmacy  Labwork: Bmet, HgA1c today  Testing/Procedures: None ordered  Follow-Up: Follow up as planned with Dr.Allred  Any Other Special Instructions Will Be Listed Below (If Applicable).     If you need a refill on your cardiac medications before your next appointment, please call your pharmacy.

## 2016-05-10 ENCOUNTER — Encounter: Payer: Self-pay | Admitting: Nurse Practitioner

## 2016-05-10 LAB — HEMOGLOBIN A1C
HEMOGLOBIN A1C: 5.1 % (ref <5.7)
MEAN PLASMA GLUCOSE: 100 mg/dL

## 2016-05-13 ENCOUNTER — Telehealth: Payer: Self-pay | Admitting: *Deleted

## 2016-05-13 NOTE — Telephone Encounter (Signed)
-----   Message from Marily LenteAmber K Seiler, NP sent at 05/09/2016  7:30 PM EDT ----- Please notify patient of stable renal function and potassium.

## 2016-05-13 NOTE — Telephone Encounter (Signed)
SPOKE TO PT ABOUT RESULTS AND VERBALIZED UNDERSTANDING  

## 2016-05-28 ENCOUNTER — Encounter: Payer: Self-pay | Admitting: Internal Medicine

## 2016-05-30 ENCOUNTER — Other Ambulatory Visit: Payer: Self-pay | Admitting: *Deleted

## 2016-05-30 ENCOUNTER — Encounter: Payer: Self-pay | Admitting: Nurse Practitioner

## 2016-05-30 MED ORDER — DILTIAZEM HCL ER COATED BEADS 240 MG PO CP24: 240.0000 mg | ORAL_CAPSULE | Freq: Every day | ORAL | 3 refills | Status: DC

## 2016-05-30 MED ORDER — FUROSEMIDE 20 MG PO TABS: 20.0000 mg | ORAL_TABLET | Freq: Every day | ORAL | 3 refills | Status: DC

## 2016-06-05 ENCOUNTER — Other Ambulatory Visit: Payer: Self-pay | Admitting: Nurse Practitioner

## 2016-06-05 ENCOUNTER — Encounter: Payer: Self-pay | Admitting: Internal Medicine

## 2016-06-06 ENCOUNTER — Other Ambulatory Visit: Payer: Self-pay | Admitting: *Deleted

## 2016-06-06 MED ORDER — FUROSEMIDE 20 MG PO TABS: 20.0000 mg | ORAL_TABLET | Freq: Every day | ORAL | 0 refills | Status: DC

## 2016-06-06 NOTE — Telephone Encounter (Signed)
Patient called to request a thirty day rx of furosemide to be sent to her local pharmacy to last until her order arrives from the [facility]. She stated that the pharmacy requested this today, but it was denied. She would like to know why as she is out of this medication. Also she stated that she had requested it through mychart and a reply was sent back to her stating that she already had a thirty day supply at the local pharmacy. She is aware that I will send it in for her now. She was very Adult nurseappreciative.

## 2016-06-11 ENCOUNTER — Encounter: Payer: Self-pay | Admitting: Internal Medicine

## 2016-06-11 ENCOUNTER — Ambulatory Visit (INDEPENDENT_AMBULATORY_CARE_PROVIDER_SITE_OTHER): Admitting: Internal Medicine

## 2016-06-11 ENCOUNTER — Other Ambulatory Visit: Payer: Self-pay

## 2016-06-11 VITALS — BP 130/80 | HR 54 | Ht 61.0 in | Wt 185.6 lb

## 2016-06-11 DIAGNOSIS — I471 Supraventricular tachycardia: Secondary | ICD-10-CM

## 2016-06-11 DIAGNOSIS — Z1231 Encounter for screening mammogram for malignant neoplasm of breast: Secondary | ICD-10-CM

## 2016-06-11 DIAGNOSIS — I10 Essential (primary) hypertension: Secondary | ICD-10-CM

## 2016-06-11 DIAGNOSIS — R002 Palpitations: Secondary | ICD-10-CM

## 2016-06-11 MED ORDER — FLECAINIDE ACETATE 50 MG PO TABS: 50.0000 mg | ORAL_TABLET | Freq: Two times a day (BID) | ORAL | 3 refills | Status: DC

## 2016-06-11 NOTE — Patient Instructions (Signed)
Medication Instructions:  Your physician has recommended you make the following change in your medication:   1) Started Flecainide 50 mg twice daily      Labwork: None ordered   Testing/Procedures: None ordered   Follow-Up: Your physician recommends that you schedule a follow-up appointment in: 2 weeks with Gypsy Balsam, NP   Any Other Special Instructions Will Be Listed Below (If Applicable).   Low-Sodium Eating Plan Sodium raises blood pressure and causes water to be held in the body. Getting less sodium from food will help lower your blood pressure, reduce any swelling, and protect your heart, liver, and kidneys. We get sodium by adding salt (sodium chloride) to food. Most of our sodium comes from canned, boxed, and frozen foods. Restaurant foods, fast foods, and pizza are also very high in sodium. Even if you take medicine to lower your blood pressure or to reduce fluid in your body, getting less sodium from your food is important. WHAT IS MY PLAN? Most people should limit their sodium intake to 2,300 mg a day. Your health care provider recommends that you limit your sodium intake to 2 grams  a day.  WHAT DO I NEED TO KNOW ABOUT THIS EATING PLAN? For the low-sodium eating plan, you will follow these general guidelines:  Choose foods with a % Daily Value for sodium of less than 5% (as listed on the food label).   Use salt-free seasonings or herbs instead of table salt or sea salt.   Check with your health care provider or pharmacist before using salt substitutes.   Eat fresh foods.  Eat more vegetables and fruits.  Limit canned vegetables. If you do use them, rinse them well to decrease the sodium.   Limit cheese to 1 oz (28 g) per day.   Eat lower-sodium products, often labeled as "lower sodium" or "no salt added."  Avoid foods that contain monosodium glutamate (MSG). MSG is sometimes added to [place] food and some canned foods.  Check food labels  (Nutrition Facts labels) on foods to learn how much sodium is in one serving.  Eat more home-cooked food and less restaurant, buffet, and fast food.  When eating at a restaurant, ask that your food be prepared with less salt, or no salt if possible.  HOW DO I READ FOOD LABELS FOR SODIUM INFORMATION? The Nutrition Facts label lists the amount of sodium in one serving of the food. If you eat more than one serving, you must multiply the listed amount of sodium by the number of servings. Food labels may also identify foods as:  Sodium free--Less than 5 mg in a serving.  Very low sodium--35 mg or less in a serving.  Low sodium--140 mg or less in a serving.  Light in sodium--50% less sodium in a serving. For example, if a food that usually has 300 mg of sodium is changed to become light in sodium, it will have 150 mg of sodium.  Reduced sodium--25% less sodium in a serving. For example, if a food that usually has 400 mg of sodium is changed to reduced sodium, it will have 300 mg of sodium. WHAT FOODS CAN I EAT? Grains Low-sodium cereals, including oats, puffed wheat and rice, and shredded wheat cereals. Low-sodium crackers. Unsalted rice and pasta. Lower-sodium bread.  Vegetables Frozen or fresh vegetables. Low-sodium or reduced-sodium canned vegetables. Low-sodium or reduced-sodium tomato sauce and paste. Low-sodium or reduced-sodium tomato and vegetable juices.  Fruits Fresh, frozen, and canned fruit. Fruit juice.  Meat and  Other Protein Products Low-sodium canned tuna and salmon. Fresh or frozen meat, poultry, seafood, and fish. Lamb. Unsalted nuts. Dried beans, peas, and lentils without added salt. Unsalted canned beans. Homemade soups without salt. Eggs.  Dairy Milk. Soy milk. Ricotta cheese. Low-sodium or reduced-sodium cheeses. Yogurt.  Condiments Fresh and dried herbs and spices. Salt-free seasonings. Onion and garlic powders. Low-sodium varieties of mustard and ketchup. Fresh  or refrigerated horseradish. Lemon juice.  Fats and Oils Reduced-sodium salad dressings. Unsalted butter.  Other Unsalted popcorn and pretzels.  The items listed above may not be a complete list of recommended foods or beverages. Contact your dietitian for more options. WHAT FOODS ARE NOT RECOMMENDED? Grains Instant hot cereals. Bread stuffing, pancake, and biscuit mixes. Croutons. Seasoned rice or pasta mixes. Noodle soup cups. Boxed or frozen macaroni and cheese. Self-rising flour. Regular salted crackers. Vegetables Regular canned vegetables. Regular canned tomato sauce and paste. Regular tomato and vegetable juices. Frozen vegetables in sauces. Salted Jamaica[place] fries. Olives. Rosita FirePickles. Relishes. Sauerkraut. Salsa. Meat and Other Protein Products Salted, canned, smoked, spiced, or pickled meats, seafood, or fish. Bacon, ham, sausage, hot dogs, corned beef, chipped beef, and packaged luncheon meats. Salt pork. Jerky. Pickled herring. Anchovies, regular canned tuna, and sardines. Salted nuts. Dairy Processed cheese and cheese spreads. Cheese curds. Blue cheese and cottage cheese. Buttermilk.  Condiments Onion and garlic salt, seasoned salt, table salt, and sea salt. Canned and packaged gravies. Worcestershire sauce. Tartar sauce. Barbecue sauce. Teriyaki sauce. Soy sauce, including reduced sodium. Steak sauce. Fish sauce. Oyster sauce. Cocktail sauce. Horseradish that you find on the shelf. Regular ketchup and mustard. Meat flavorings and tenderizers. Bouillon cubes. Hot sauce. Tabasco sauce. Marinades. Taco seasonings. Relishes. Fats and Oils Regular salad dressings. Salted butter. Margarine. Ghee. Bacon fat.  Other Potato and tortilla chips. Corn chips and puffs. Salted popcorn and pretzels. Canned or dried soups. Pizza. Frozen entrees and pot pies.  The items listed above may not be a complete list of foods and beverages to avoid. Contact your dietitian for more information.     This information is not intended to replace advice given to you by your health care provider. Make sure you discuss any questions you have with your health care provider.   Document Released: 02/14/2002 Document Revised: 09/15/2014 Document Reviewed: 06/29/2013 Elsevier Interactive Patient Education [employer]2016 Elsevier Inc.      If you need a refill on your cardiac medications before your next appointment, please call your pharmacy.

## 2016-06-11 NOTE — Progress Notes (Signed)
Electrophysiology Office Note   Date:  06/11/2016   ID:  Lori Primaatti M Caudle, DOB 1956/06/09, MRN [id number]  PCP:  Abigail MiyamotoPERRY,LAWRENCE EDWARD, MD  Cardiologist:  Dr Kirke CorinArida Primary Electrophysiologist: Dr Graciela HusbandsKlein  (years ago)  Chief Complaint  Patient presents with  . Palpitations     History of Present Illness: Lori Phelps is a 60 y.o. female who presents today for electrophysiology evaluation.   She reports having papitations "for years".  She has PACs, nonsustained atach.  This has improved but not resolved with diltiazem.  She has had some welling for which hctz was switched to lasix and norvasc was discontinued. Today, she denies symptoms of chest pain, shortness of breath, orthopnea, PND,  claudication, dizziness, presyncope, syncope, bleeding, or neurologic sequela. The patient is tolerating medications without difficulties and is otherwise without complaint today.    Past Medical History:  Diagnosis Date  . Atrial tachycardia (HCC)   . Diabetes mellitus without complication (HCC)   . HLD (hyperlipidemia)   . HTN (hypertension)   . Hx of bronchitis    uses MDI prn and on advair daily but no dx of asthma   . Hypothyroidism   . Overweight   . Palpitations    hx PAC"S   Past Surgical History:  Procedure Laterality Date  . BACK SURGERY  1994  . COLONOSCOPY  2002  . DILATATION & CURETTAGE/HYSTEROSCOPY WITH MYOSURE N/A 08/22/2015   Procedure: DILATATION & CURETTAGE/HYSTEROSCOPY WITH MYOSURE AND POLYPECTOMY;  Surgeon: Huel CoteKathy Richardson, MD;  Location: [facility];  Service: Gynecology;  Laterality: N/A;  . NASAL SINUS SURGERY     [id number],1981     Current Outpatient Prescriptions  Medication Sig Dispense Refill  . albuterol (PROVENTIL HFA;VENTOLIN HFA) 108 (90 BASE) MCG/ACT inhaler Inhale 2 puffs into the lungs every 6 (six) hours as needed for wheezing or shortness of breath.    . Ascorbic Acid (VITAMIN C PO) Take 1 tablet by mouth daily.    Marland Kitchen. aspirin 325 MG tablet Take 325 mg by  mouth daily.      . Coenzyme Q10 (CO Q 10 PO) Take 1 capsule by mouth daily.    Marland Kitchen. diltiazem (CARDIZEM CD) 240 MG 24 hr capsule Take 1 capsule (240 mg total) by mouth daily. 90 capsule 3  . Fluticasone-Salmeterol (ADVAIR DISKUS) 250-50 MCG/DOSE AEPB Inhale 1 puff into the lungs every 12 (twelve) hours.      . furosemide (LASIX) 20 MG tablet Take 1 tablet (20 mg total) by mouth daily. 30 tablet 0  . levothyroxine (SYNTHROID, LEVOTHROID) 25 MCG tablet Take 25 mcg by mouth daily.      . metFORMIN (GLUCOPHAGE) 500 MG tablet Take 500 mg by mouth 2 (two) times daily with a meal.    . metoprolol tartrate (LOPRESSOR) 25 MG tablet Take 1 tablet (25 mg total) by mouth 2 (two) times daily. 60 tablet 11  . Multiple Vitamin (MULTIVITAMIN) capsule Take 1 capsule by mouth daily.    . Omega-3 Fatty Acids (FISH OIL PO) Take 2 capsules by mouth daily.    Marland Kitchen. omeprazole (PRILOSEC) 20 MG capsule Take 20 mg by mouth daily.      . simvastatin (ZOCOR) 20 MG tablet Take 1 tablet (20 mg total) by mouth at bedtime. 90 tablet 2   No current facility-administered medications for this visit.     Allergies:   Penicillins and Sulfonamide derivatives   Social History:  The patient  reports that she has quit smoking. She has never used smokeless  tobacco. She reports that she does not drink alcohol or use drugs.   Family History:  The patient reports multiple family members with Afib   ROS:  Please see the history of present illness.   All other systems are reviewed and negative.    PHYSICAL EXAM: VS:  BP 130/80   Pulse (!) 54   Ht 5\' 1"  (1.549 m)   Wt 185 lb 9.6 oz (84.2 kg)   LMP 05/21/2015 Comment: irregular since age 63   BMI 35.07 kg/m  , BMI Body mass index is 35.07 kg/m. GEN: Well nourished, well developed, in no acute distress  HEENT: normal  Neck: no JVD, carotid bruits, or masses Cardiac: RRR; no murmurs, rubs, or gallops,minimal edema  Respiratory:  clear to auscultation bilaterally, normal work of  breathing GI: soft, nontender, nondistended, + BS MS: no deformity or atrophy  Skin: warm and dry  Neuro:  Strength and sensation are intact Psych: euthymic mood, full affect  EKG:  EKG is ordered today. The ekg ordered today shows sinus rhythm  54 bpm, otherwise normal ekg   Recent Labs: 08/16/2015: Hemoglobin 16.1; Platelets 205 05/09/2016: BUN 14; Creat 0.72; Potassium 4.2; Sodium 142    Lipid Panel  No results found for: CHOL, TRIG, HDL, CHOLHDL, VLDL, LDLCALC, LDLDIRECT   Wt Readings from Last 3 Encounters:  06/11/16 185 lb 9.6 oz (84.2 kg)  05/09/16 184 lb 6.4 oz (83.6 kg)  04/23/16 188 lb 9.6 oz (85.5 kg)     ASSESSMENT AND PLAN:  1.  PACs, nonsustained atach, nonsustained afib The patient has symptomatic atrial arrhythmias.  She has failed medical therapy with metoprolol and diltiazem. At this time, I will add flecainide 50mg  BID.  This can be uptitrated if needed.  Once arrhythmia is controlled, will try to wean diltiazem.    2. Obesity Remarkable response to lifestyle change I have commended her on her efforts  Follow-up:  EP NP in 2 weeks   Current medicines are reviewed at length with the patient today.   The patient does not have concerns regarding her medicines.  The following changes were made today:  none  Signed, Hillis Range, MD  06/11/2016 10:09 AM     [facility] [address] [place] [address] [phone] (office) [phone] (fax)

## 2016-06-12 ENCOUNTER — Ambulatory Visit
Admission: RE | Admit: 2016-06-12 | Discharge: 2016-06-12 | Disposition: A | Discharge status: Home / Self Care | Source: Ambulatory Visit

## 2016-06-12 DIAGNOSIS — Z1231 Encounter for screening mammogram for malignant neoplasm of breast: Secondary | ICD-10-CM

## 2016-06-30 ENCOUNTER — Encounter: Payer: Self-pay | Admitting: Internal Medicine

## 2016-07-03 ENCOUNTER — Ambulatory Visit: Admitting: Internal Medicine

## 2016-07-21 ENCOUNTER — Ambulatory Visit: Admitting: Internal Medicine

## 2016-08-04 ENCOUNTER — Encounter: Payer: Self-pay | Admitting: Internal Medicine

## 2016-08-08 ENCOUNTER — Encounter: Payer: Self-pay | Admitting: Internal Medicine

## 2016-08-25 NOTE — Progress Notes (Deleted)
Electrophysiology Office Note Date: 08/25/2016  ID:  Lori Phelps, DOB [birth year], MRN [id number]  PCP: Abigail MiyamotoPERRY,LAWRENCE EDWARD, MD Primary Cardiologist: Kirke CorinArida Electrophysiologist: Allred  CC: follow up atrial arrhythmias  Lori Phelps is a 60 y.o. female seen today for Dr Johney FrameAllred.  She presents today for routine electrophysiology followup.  She was started on Flecainide 06/2016 by Dr Johney FrameAllred for symptomatic atrial arrhythmias.  She denies chest pain, palpitations, dyspnea, PND, orthopnea, nausea, vomiting, dizziness, syncope, weight gain, or early satiety.  Past Medical History:  Diagnosis Date  . Atrial tachycardia (HCC)   . Diabetes mellitus without complication (HCC)   . HLD (hyperlipidemia)   . HTN (hypertension)   . Hx of bronchitis    uses MDI prn and on advair daily but no dx of asthma   . Hypothyroidism   . Overweight   . Palpitations    hx PAC"S   Past Surgical History:  Procedure Laterality Date  . BACK SURGERY  1994  . COLONOSCOPY  2002  . DILATATION & CURETTAGE/HYSTEROSCOPY WITH MYOSURE N/A 08/22/2015   Procedure: DILATATION & CURETTAGE/HYSTEROSCOPY WITH MYOSURE AND POLYPECTOMY;  Surgeon: Huel CoteKathy Richardson, MD;  Location: [facility];  Service: Gynecology;  Laterality: N/A;  . NASAL SINUS SURGERY     [id number],1981    Current Outpatient Prescriptions  Medication Sig Dispense Refill  . albuterol (PROVENTIL HFA;VENTOLIN HFA) 108 (90 BASE) MCG/ACT inhaler Inhale 2 puffs into the lungs every 6 (six) hours as needed for wheezing or shortness of breath.    . Ascorbic Acid (VITAMIN C PO) Take 1 tablet by mouth daily.    Marland Kitchen. aspirin 325 MG tablet Take 325 mg by mouth daily.      . Coenzyme Q10 (CO Q 10 PO) Take 1 capsule by mouth daily.    Marland Kitchen. diltiazem (CARDIZEM CD) 240 MG 24 hr capsule Take 1 capsule (240 mg total) by mouth daily. 90 capsule 3  . flecainide (TAMBOCOR) 50 MG tablet Take 1 tablet (50 mg total) by mouth 2 (two) times daily. 180 tablet 3  .  Fluticasone-Salmeterol (ADVAIR DISKUS) 250-50 MCG/DOSE AEPB Inhale 1 puff into the lungs every 12 (twelve) hours.      . furosemide (LASIX) 20 MG tablet Take 1 tablet (20 mg total) by mouth daily. 30 tablet 0  . levothyroxine (SYNTHROID, LEVOTHROID) 25 MCG tablet Take 25 mcg by mouth daily.      . metFORMIN (GLUCOPHAGE) 500 MG tablet Take 500 mg by mouth 2 (two) times daily with a meal.    . metoprolol tartrate (LOPRESSOR) 25 MG tablet Take 1 tablet (25 mg total) by mouth 2 (two) times daily. 60 tablet 11  . Multiple Vitamin (MULTIVITAMIN) capsule Take 1 capsule by mouth daily.    . Omega-3 Fatty Acids (FISH OIL PO) Take 2 capsules by mouth daily.    Marland Kitchen. omeprazole (PRILOSEC) 20 MG capsule Take 20 mg by mouth daily.      . simvastatin (ZOCOR) 20 MG tablet Take 1 tablet (20 mg total) by mouth at bedtime. 90 tablet 2   No current facility-administered medications for this visit.     Allergies:   Penicillins and Sulfonamide derivatives   Social History: Social History   Social History  . Marital status: Married    Spouse name: N/A  . Number of children: 2  . Years of education: N/A   Occupational History  . Not on file.   Social History Main Topics  . Smoking status: Former Games developermoker  .  Smokeless tobacco: Never Used  . Alcohol use No  . Drug use: No  . Sexual activity: Not on file   Other Topics Concern  . Not on file   Social History Narrative   [place].  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    Family History: Family History  Problem Relation Age of Onset  . Kidney disease    . Kidney disease Mother   . Heart failure Father   . Colon cancer Neg Hx   . Colon polyps Neg Hx   . Esophageal cancer Neg Hx   . Rectal cancer Neg Hx   . Stomach cancer Neg Hx     Review of Systems: All other systems reviewed and are otherwise negative except as noted above.   Physical Exam: VS:  LMP 05/21/2015 Comment: irregular since age 60  ,  BMI There is no height or weight on file to calculate BMI. Wt Readings from Last 3 Encounters:  06/11/16 185 lb 9.6 oz (84.2 kg)  05/09/16 184 lb 6.4 oz (83.6 kg)  04/23/16 188 lb 9.6 oz (85.5 kg)    GEN- The patient is well appearing, alert and oriented x 3 today.   HEENT: normocephalic, atraumatic; sclera clear, conjunctiva pink; hearing intact; oropharynx clear; neck supple  Lungs- Clear to ausculation bilaterally, normal work of breathing.  No wheezes, rales, rhonchi Heart- Regular rate and rhythm  GI- soft, non-tender, non-distended, bowel sounds present  Extremities- no clubbing, cyanosis, +BLE dependent edema MS- no significant deformity or atrophy Skin- warm and dry, no rash or lesion  Psych- euthymic mood, full affect Neuro- strength and sensation are intact   EKG:  EKG is ordered today. EKG today demonstrates ***  Recent Labs: 05/09/2016: BUN 14; Creat 0.72; Potassium 4.2; Sodium 142    Other studies Reviewed: Additional studies/ records that were reviewed today include: Dr Jenel LucksAllred's office notes  Assessment and Plan: 1.  PAC's, atrial tachycardia, non sustained AF Improved with Flecainide EKG stable No changes today   2.  HTN Stable No change required today  Current medicines are reviewed at length with the patient today.   The patient does not have concerns regarding her medicines.  The following changes were made today:  none  Labs/ tests ordered today include: none No orders of the defined types were placed in this encounter.    Disposition:   Follow up with Dr Johney FrameAllred in 6 months    Signed, Gypsy BalsamAmber Seiler, NP 08/25/2016 4:02 PM   [facility] [address] [place] [address] [phone] (office) [phone] (fax)

## 2016-08-27 ENCOUNTER — Ambulatory Visit (INDEPENDENT_AMBULATORY_CARE_PROVIDER_SITE_OTHER): Admitting: Nurse Practitioner

## 2016-08-27 ENCOUNTER — Encounter: Payer: Self-pay | Admitting: Nurse Practitioner

## 2016-08-27 VITALS — BP 136/84 | HR 53 | Resp 18 | Wt 189.0 lb

## 2016-08-27 DIAGNOSIS — I471 Supraventricular tachycardia: Secondary | ICD-10-CM | POA: Diagnosis not present

## 2016-08-27 DIAGNOSIS — I10 Essential (primary) hypertension: Secondary | ICD-10-CM

## 2016-08-27 MED ORDER — FLECAINIDE ACETATE 50 MG PO TABS: 50.0000 mg | ORAL_TABLET | Freq: Two times a day (BID) | ORAL | 3 refills | Status: DC

## 2016-08-27 NOTE — Patient Instructions (Signed)
Medication Instructions:   Your physician recommends that you continue on your current medications as directed. Please refer to the Current Medication list given to you today.    If you need a refill on your cardiac medications before your next appointment, please call your pharmacy.  Labwork: NONE ORDERED  TODAY    Testing/Procedures: NONE ORDERED  TODAY    Follow-Up:  Your physician wants you to follow-up in:  IN  6 MONTHS WITH DR ALLRED   You will receive a reminder letter in the mail two months in advance. If you don't receive a letter, please call our office to schedule the follow-up appointment.      Any Other Special Instructions Will Be Listed Below (If Applicable).                                                                                                                                                   

## 2016-08-27 NOTE — Progress Notes (Signed)
Electrophysiology Office Note Date: 08/27/2016  ID:  Lori Phelps, DOB 16-Jan-1956, MRN [id number]  PCP: Abigail MiyamotoPERRY,Lori EDWARD, MD Primary Cardiologist: Kirke CorinArida Electrophysiologist: Allred  CC: follow up atrial arrhythmias  Lori Phelps is a 60 y.o. female seen today for Dr Lori Phelps.  She presents today for routine electrophysiology followup.  She was started on Flecainide 06/2016 by Dr Lori Phelps for symptomatic atrial arrhythmias. Since that time, she reports some improvement in palpitations, but not completely resolved. Her LE edema has resolved off of Amlodipine.  She denies chest pain, dyspnea, PND, orthopnea, nausea, vomiting, dizziness, syncope, weight gain, or early satiety.  Past Medical History:  Diagnosis Date  . Atrial tachycardia (HCC)   . Diabetes mellitus without complication (HCC)   . HLD (hyperlipidemia)   . HTN (hypertension)   . Hx of bronchitis    uses MDI prn and on advair daily but no dx of asthma   . Hypothyroidism   . Overweight   . Palpitations    hx PAC"S   Past Surgical History:  Procedure Laterality Date  . BACK SURGERY  1994  . COLONOSCOPY  2002  . DILATATION & CURETTAGE/HYSTEROSCOPY WITH MYOSURE N/A 08/22/2015   Procedure: DILATATION & CURETTAGE/HYSTEROSCOPY WITH MYOSURE AND POLYPECTOMY;  Surgeon: Huel CoteKathy Richardson, MD;  Location: [facility];  Service: Gynecology;  Laterality: N/A;  . NASAL SINUS SURGERY     [id number],1981    Current Outpatient Prescriptions  Medication Sig Dispense Refill  . albuterol (PROVENTIL HFA;VENTOLIN HFA) 108 (90 BASE) MCG/ACT inhaler Inhale 2 puffs into the lungs every 6 (six) hours as needed for wheezing or shortness of breath.    . Ascorbic Acid (VITAMIN C PO) Take 1 tablet by mouth daily.    Marland Kitchen. aspirin 325 MG tablet Take 325 mg by mouth daily.      . Coenzyme Q10 (CO Q 10 PO) Take 1 capsule by mouth daily.    Marland Kitchen. diltiazem (CARDIZEM CD) 240 MG 24 hr capsule Take 1 capsule (240 mg total) by mouth daily. 90 capsule 3  .  flecainide (TAMBOCOR) 50 MG tablet Take 1 tablet (50 mg total) by mouth 2 (two) times daily. 180 tablet 3  . Fluticasone-Salmeterol (ADVAIR DISKUS) 250-50 MCG/DOSE AEPB Inhale 1 puff into the lungs every 12 (twelve) hours.      . furosemide (LASIX) 20 MG tablet Take 1 tablet (20 mg total) by mouth daily. 30 tablet 0  . levothyroxine (SYNTHROID, LEVOTHROID) 25 MCG tablet Take 25 mcg by mouth daily.      . metFORMIN (GLUCOPHAGE) 500 MG tablet Take 500 mg by mouth 2 (two) times daily with a meal.    . metoprolol tartrate (LOPRESSOR) 25 MG tablet Take 1 tablet (25 mg total) by mouth 2 (two) times daily. 60 tablet 11  . Multiple Vitamin (MULTIVITAMIN) capsule Take 1 capsule by mouth daily.    . Omega-3 Fatty Acids (FISH OIL PO) Take 2 capsules by mouth daily.    Marland Kitchen. omeprazole (PRILOSEC) 20 MG capsule Take 20 mg by mouth daily.      . simvastatin (ZOCOR) 20 MG tablet Take 1 tablet (20 mg total) by mouth at bedtime. 90 tablet 2   No current facility-administered medications for this visit.     Allergies:   Penicillins and Sulfonamide derivatives   Social History: Social History   Social History  . Marital status: Married    Spouse name: N/A  . Number of children: 2  . Years of education: N/A   Occupational  History  . Not on file.   Social History Main Topics  . Smoking status: Former Games developermoker  . Smokeless tobacco: Never Used  . Alcohol use No  . Drug use: No  . Sexual activity: Not on file   Other Topics Concern  . Not on file   Social History Narrative   [place] in [place].  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    Family History: Family History  Problem Relation Age of Onset  . Kidney disease    . Kidney disease Mother   . Heart failure Father   . Colon cancer Neg Hx   . Colon polyps Neg Hx   . Esophageal cancer Neg Hx   . Rectal cancer Neg Hx   . Stomach cancer Neg Hx     Review of Systems: All other systems reviewed and are otherwise  negative except as noted above.   Physical Exam: VS:  BP 136/84   Pulse (!) 53   Resp 18   Wt 189 lb (85.7 kg)   LMP 05/21/2015 Comment: irregular since age 60   SpO2 97%   BMI 35.71 kg/m  , BMI Body mass index is 35.71 kg/m. Wt Readings from Last 3 Encounters:  08/27/16 189 lb (85.7 kg)  06/11/16 185 lb 9.6 oz (84.2 kg)  05/09/16 184 lb 6.4 oz (83.6 kg)    GEN- The patient is well appearing, alert and oriented x 3 today.   HEENT: normocephalic, atraumatic; sclera clear, conjunctiva pink; hearing intact; oropharynx clear; neck supple  Lungs- Clear to ausculation bilaterally, normal work of breathing.  No wheezes, rales, rhonchi Heart- Regular rate and rhythm  GI- soft, non-tender, non-distended, bowel sounds present  Extremities- no clubbing, cyanosis, no edema MS- no significant deformity or atrophy Skin- warm and dry, no rash or lesion  Psych- euthymic mood, full affect Neuro- strength and sensation are intact   EKG:  EKG is ordered today. EKG today demonstrates sinus brady, rate 59, PR 150msec, QRS 84msec  Recent Labs: 05/09/2016: BUN 14; Creat 0.72; Potassium 4.2; Sodium 142    Other studies Reviewed: Additional studies/ records that were reviewed today include: Dr Jenel LucksAllred's office notes  Assessment and Plan: 1.  PAC's, atrial tachycardia, non sustained AF Improved with Flecainide EKG stable Still with some breakthrough tachycardia, I have offered increasing Flecainide today. She would like to stay on current dose for now. If palpitations increase, I have advised she can take Flecainide 50mg  tid or 75mg  bid.  She should call and let us know if this occurs so that we can repeat EKG No changes today   2.  HTN Stable No change required today  Current medicines are reviewed at length with the patient today.   The patient does not have concerns regarding her medicines.  The following changes were made today:  none  Labs/ tests ordered today include: none No orders  of the defined types were placed in this encounter.    Disposition:   Follow up with me or Dr Lori Phelps in 6 months    Signed, Gypsy BalsamAmber Seiler, NP 08/27/2016 9:31 AM   [facility] [address] [place] [address] [phone] (office) [phone] (fax)

## 2016-08-27 NOTE — Addendum Note (Signed)
Addended by: Oleta MouseVERTON, Dawnelle Warman M on: 08/27/2016 11:42 AM   Modules accepted: Orders

## 2016-09-24 ENCOUNTER — Ambulatory Visit: Admitting: Internal Medicine

## 2016-10-22 ENCOUNTER — Ambulatory Visit: Admitting: Sports Medicine

## 2016-10-30 ENCOUNTER — Ambulatory Visit: Admitting: Sports Medicine

## 2016-11-12 ENCOUNTER — Encounter: Payer: Self-pay | Admitting: Sports Medicine

## 2016-11-12 ENCOUNTER — Ambulatory Visit (INDEPENDENT_AMBULATORY_CARE_PROVIDER_SITE_OTHER): Admitting: Sports Medicine

## 2016-11-12 DIAGNOSIS — B351 Tinea unguium: Secondary | ICD-10-CM | POA: Diagnosis not present

## 2016-11-12 DIAGNOSIS — E119 Type 2 diabetes mellitus without complications: Secondary | ICD-10-CM

## 2016-11-12 DIAGNOSIS — M79675 Pain in left toe(s): Secondary | ICD-10-CM | POA: Diagnosis not present

## 2016-11-12 DIAGNOSIS — L601 Onycholysis: Secondary | ICD-10-CM

## 2016-11-12 DIAGNOSIS — M79674 Pain in right toe(s): Secondary | ICD-10-CM | POA: Diagnosis not present

## 2016-11-12 NOTE — Progress Notes (Signed)
Subjective: Lori Phelps is a 61 y.o. female patient with history of diabetes who presents to office today complaining of long, painful nails  while ambulating in shoes; unable to trim. Patient states that the glucose reading this morning was not recorded, but less A1c was 5.1.. Patient denies any new changes in medication or new problems. Patient denies any new cramping, numbness, burning or tingling in the legs.  Patient Active Problem List   Diagnosis Date Noted  . Atrial tachycardia (HCC)   . HTN (hypertension)   . HLD (hyperlipidemia)    Current Outpatient Prescriptions on File Prior to Visit  Medication Sig Dispense Refill  . albuterol (PROVENTIL HFA;VENTOLIN HFA) 108 (90 BASE) MCG/ACT inhaler Inhale 2 puffs into the lungs every 6 (six) hours as needed for wheezing or shortness of breath.    . Ascorbic Acid (VITAMIN C PO) Take 1 tablet by mouth daily.    Marland Kitchen. aspirin 325 MG tablet Take 325 mg by mouth daily.      . Coenzyme Q10 (CO Q 10 PO) Take 1 capsule by mouth daily.    Marland Kitchen. diltiazem (CARDIZEM CD) 240 MG 24 hr capsule Take 1 capsule (240 mg total) by mouth daily. 90 capsule 3  . flecainide (TAMBOCOR) 50 MG tablet Take 1 tablet (50 mg total) by mouth 2 (two) times daily. 180 tablet 3  . Fluticasone-Salmeterol (ADVAIR DISKUS) 250-50 MCG/DOSE AEPB Inhale 1 puff into the lungs every 12 (twelve) hours.      . furosemide (LASIX) 20 MG tablet Take 1 tablet (20 mg total) by mouth daily. 30 tablet 0  . levothyroxine (SYNTHROID, LEVOTHROID) 25 MCG tablet Take 25 mcg by mouth daily.      . metFORMIN (GLUCOPHAGE) 500 MG tablet Take 500 mg by mouth 2 (two) times daily with a meal.    . metoprolol tartrate (LOPRESSOR) 25 MG tablet Take 1 tablet (25 mg total) by mouth 2 (two) times daily. 60 tablet 11  . Multiple Vitamin (MULTIVITAMIN) capsule Take 1 capsule by mouth daily.    . Omega-3 Fatty Acids (FISH OIL PO) Take 2 capsules by mouth daily.    Marland Kitchen. omeprazole (PRILOSEC) 20 MG capsule Take 20 mg by  mouth daily.      . simvastatin (ZOCOR) 20 MG tablet Take 1 tablet (20 mg total) by mouth at bedtime. 90 tablet 2   No current facility-administered medications on file prior to visit.    Allergies  Allergen Reactions  . Penicillins Hives    Has patient had a PCN reaction causing immediate rash, facial/tongue/throat swelling, SOB or lightheadedness with hypotension: No Has patient had a PCN reaction causing severe rash involving mucus membranes or skin necrosis: No Has patient had a PCN reaction that required hospitalization No Has patient had a PCN reaction occurring within the last 10 years: No If all of the above answers are "NO", then may proceed with Cephalosporin use.   . Sulfonamide Derivatives Rash    No results found for this or any previous visit (from the past 2160 hour(s)).  Objective: General: Patient is awake, alert, and oriented x 3 and in no acute distress.  Integument: Skin is warm, dry and supple bilateral. Nails are tender, long, thickened and  dystrophic with subungual debris, consistent with onychomycosis, 1-5 bilateralWith distal lysis, secondary to microtrauma. Bilateral hallux. No signs of infection. No open lesions or preulcerative lesions present bilateral. Remaining integument unremarkable.  Vasculature:  Dorsalis Pedis pulse 1/4 bilateral. Posterior Tibial pulse  1/4 bilateral.  Capillary fill time <3 sec 1-5 bilateral. Positive hair growth to the level of the digits. Temperature gradient within normal limits. No varicosities present bilateral. No edema present bilateral.   Neurology: The patient has intact sensation measured with a 5.07/10g Semmes Weinstein Monofilament at all pedal sites bilateral . Vibratory sensation intact bilateral with tuning fork. No Babinski sign present bilateral.   Musculoskeletal: No symptomatic pedal deformities noted bilateral. Pes planus foot type bilateral. Muscular strength 5/5 in all lower extremity muscular groups  bilateral without pain on range of motion . No tenderness with calf compression bilateral.  Assessment and Plan: Problem List Items Addressed This Visit    None    Visit Diagnoses    Dermatophytosis of nail    -  Primary   Onycholysis       Toe pain, bilateral       Diabetes mellitus without complication (HCC)          -Examined patient. -Discussed and educated patient on diabetic foot care, especially with  regards to the vascular, neurological and musculoskeletal systems.  -Stressed the importance of good glycemic control and the detriment of not  controlling glucose levels in relation to the foot. -Mechanically debrided all nails 1-5 bilateral using sterile nail nipper and filed with dremel without incident  -Answered all patient questions -Patient to return  in 3 months for at risk foot care -Patient advised to call the office if any problems or questions arise in the meantime.  Asencion Islam, DPM

## 2017-02-04 ENCOUNTER — Encounter: Payer: Self-pay | Admitting: Internal Medicine

## 2017-02-11 ENCOUNTER — Ambulatory Visit (INDEPENDENT_AMBULATORY_CARE_PROVIDER_SITE_OTHER): Admitting: Sports Medicine

## 2017-02-11 DIAGNOSIS — E119 Type 2 diabetes mellitus without complications: Secondary | ICD-10-CM | POA: Diagnosis not present

## 2017-02-11 DIAGNOSIS — L84 Corns and callosities: Secondary | ICD-10-CM | POA: Diagnosis not present

## 2017-02-11 DIAGNOSIS — M79675 Pain in left toe(s): Secondary | ICD-10-CM

## 2017-02-11 DIAGNOSIS — B351 Tinea unguium: Secondary | ICD-10-CM | POA: Diagnosis not present

## 2017-02-11 DIAGNOSIS — M79674 Pain in right toe(s): Secondary | ICD-10-CM | POA: Diagnosis not present

## 2017-02-11 NOTE — Progress Notes (Signed)
Subjective: Lori Phelps is a 61 y.o. female patient with history of diabetes who presents to office today complaining of long, painful nails  while ambulating in shoes; unable to trim. Patient states that the glucose reading this morning was good, always below 100. Patient denies any new changes in medication or new problems.  Patient Active Problem List   Diagnosis Date Noted  . Atrial tachycardia ([place])   . HTN (hypertension)   . HLD (hyperlipidemia)    Current Outpatient Prescriptions on File Prior to Visit  Medication Sig Dispense Refill  . albuterol (PROVENTIL HFA;VENTOLIN HFA) 108 (90 BASE) MCG/ACT inhaler Inhale 2 puffs into the lungs every 6 (six) hours as needed for wheezing or shortness of breath.    . Ascorbic Acid (VITAMIN C PO) Take 1 tablet by mouth daily.    Marland Kitchen aspirin 325 MG tablet Take 325 mg by mouth daily.      . Coenzyme Q10 (CO Q 10 PO) Take 1 capsule by mouth daily.    Marland Kitchen diltiazem (CARDIZEM CD) 240 MG 24 hr capsule Take 1 capsule (240 mg total) by mouth daily. 90 capsule 3  . flecainide (TAMBOCOR) 50 MG tablet Take 1 tablet (50 mg total) by mouth 2 (two) times daily. 180 tablet 3  . Fluticasone-Salmeterol (ADVAIR DISKUS) 250-50 MCG/DOSE AEPB Inhale 1 puff into the lungs every 12 (twelve) hours.      . furosemide (LASIX) 20 MG tablet Take 1 tablet (20 mg total) by mouth daily. 30 tablet 0  . levothyroxine (SYNTHROID, LEVOTHROID) 25 MCG tablet Take 25 mcg by mouth daily.      . metFORMIN (GLUCOPHAGE) 500 MG tablet Take 500 mg by mouth 2 (two) times daily with a meal.    . metoprolol tartrate (LOPRESSOR) 25 MG tablet Take 1 tablet (25 mg total) by mouth 2 (two) times daily. 60 tablet 11  . Multiple Vitamin (MULTIVITAMIN) capsule Take 1 capsule by mouth daily.    . Omega-3 Fatty Acids (FISH OIL PO) Take 2 capsules by mouth daily.    Marland Kitchen omeprazole (PRILOSEC) 20 MG capsule Take 20 mg by mouth daily.      . simvastatin (ZOCOR) 20 MG tablet Take 1 tablet (20 mg total) by mouth  at bedtime. 90 tablet 2   No current facility-administered medications on file prior to visit.    Allergies  Allergen Reactions  . Penicillins Hives    Has patient had a PCN reaction causing immediate rash, facial/tongue/throat swelling, SOB or lightheadedness with hypotension: No Has patient had a PCN reaction causing severe rash involving mucus membranes or skin necrosis: No Has patient had a PCN reaction that required hospitalization No Has patient had a PCN reaction occurring within the last 10 years: No If all of the above answers are "NO", then may proceed with Cephalosporin use.   . Sulfonamide Derivatives Rash    No results found for this or any previous visit (from the past 2160 hour(s)).  Objective: General: Patient is awake, alert, and oriented x 3 and in no acute distress.  Integument: Skin is warm, dry and supple bilateral. Nails are tender, long, thickened and  dystrophic with subungual debris, consistent with onychomycosis, 1-5 bilateral with distal lysis, secondary to microtrauma. Bilateral hallux. Callus sub met 5 on left. No signs of infection. No open lesions or preulcerative lesions present bilateral. Remaining integument unremarkable.  Vasculature:  Dorsalis Pedis pulse 1/4 bilateral. Posterior Tibial pulse  1/4 bilateral.  Capillary fill time <3 sec 1-5 bilateral. Positive  hair growth to the level of the digits. Temperature gradient within normal limits. No varicosities present bilateral. No edema present bilateral.   Neurology: The patient has intact sensation measured with a 5.07/10g Semmes Weinstein Monofilament at all pedal sites bilateral . Vibratory sensation intact bilateral with tuning fork. No Babinski sign present bilateral.   Musculoskeletal: No symptomatic pedal deformities noted bilateral. Pes planus foot type bilateral. Muscular strength 5/5 in all lower extremity muscular groups bilateral without pain on range of motion . No tenderness with calf  compression bilateral.  Assessment and Plan: Problem List Items Addressed This Visit    None    Visit Diagnoses    Dermatophytosis of nail    -  Primary   Toe pain, bilateral       Diabetes mellitus without complication ([place])       Callus of foot         -Examined patient. -Discussed and educated patient on diabetic foot care, especially with  regards to the vascular, neurological and musculoskeletal systems.  -Stressed the importance of good glycemic control and the detriment of not  controlling glucose levels in relation to the foot. -Mechanically debrided callus x 1 using sterile chisel blade and all nails 1-5 bilateral using sterile nail nipper and filed with dremel without incident  -Answered all patient questions -Patient to return  in 3 months for at risk foot care -Patient advised to call the office if any problems or questions arise in the meantime.  Landis Martins, DPM

## 2017-02-23 ENCOUNTER — Encounter: Payer: Self-pay | Admitting: Internal Medicine

## 2017-02-23 ENCOUNTER — Ambulatory Visit (INDEPENDENT_AMBULATORY_CARE_PROVIDER_SITE_OTHER): Admitting: Internal Medicine

## 2017-02-23 VITALS — BP 136/78 | HR 62 | Ht 61.0 in | Wt 199.2 lb

## 2017-02-23 DIAGNOSIS — I10 Essential (primary) hypertension: Secondary | ICD-10-CM | POA: Diagnosis not present

## 2017-02-23 DIAGNOSIS — R002 Palpitations: Secondary | ICD-10-CM

## 2017-02-23 DIAGNOSIS — I471 Supraventricular tachycardia: Secondary | ICD-10-CM

## 2017-02-23 MED ORDER — FLECAINIDE ACETATE 50 MG PO TABS: 50.0000 mg | ORAL_TABLET | Freq: Two times a day (BID) | ORAL | 3 refills | Status: DC

## 2017-02-23 MED ORDER — FUROSEMIDE 20 MG PO TABS: 20.0000 mg | ORAL_TABLET | Freq: Every day | ORAL | 3 refills | Status: DC

## 2017-02-23 MED ORDER — SIMVASTATIN 20 MG PO TABS: 20.0000 mg | ORAL_TABLET | Freq: Every day | ORAL | 3 refills | Status: DC

## 2017-02-23 MED ORDER — DILTIAZEM HCL ER COATED BEADS 240 MG PO CP24: 240.0000 mg | ORAL_CAPSULE | Freq: Every day | ORAL | 3 refills | Status: DC

## 2017-02-23 MED ORDER — METOPROLOL TARTRATE 25 MG PO TABS: 25.0000 mg | ORAL_TABLET | Freq: Two times a day (BID) | ORAL | 3 refills | Status: DC

## 2017-02-23 NOTE — Patient Instructions (Addendum)
Medication Instructions:  Your physician has recommended you make the following change in your medication:  1) Decrease Aspirin to 81 mg daily    Labwork: None ordered   Testing/Procedures: None ordered   Follow-Up: Your physician recommends that you schedule a follow-up appointment in: 12 months with Lori BalsamAmber Seiler, NP and move Dr Jari SportsmanArida's July appointment out to December 2018   Any Other Special Instructions Will Be Listed Below (If Applicable).     If you need a refill on your cardiac medications before your next appointment, please call your pharmacy.

## 2017-02-23 NOTE — Progress Notes (Signed)
PCP: Lori Phelps, Lori Phelps, Lori Phelps Primary Cardiologist: Dr Doristine JohnsArida  Lori Phelps Reha is a 61 y.o. female who presents today for routine electrophysiology followup.  Since last being seen in our clinic, the patient reports doing very well.  Her palpitations are well controlled.  Swelling resolved off of norvasc. Today, she denies symptoms of chest pain, shortness of breath, dizziness, presyncope, or syncope.  The patient is otherwise without complaint today.   Past Medical History:  Diagnosis Date  . Atrial tachycardia (HCC)   . Diabetes mellitus without complication (HCC)   . HLD (hyperlipidemia)   . HTN (hypertension)   . Hx of bronchitis    uses MDI prn and on advair daily but no dx of asthma   . Hypothyroidism   . Overweight   . Palpitations    hx PAC"S   Past Surgical History:  Procedure Laterality Date  . BACK SURGERY  1994  . COLONOSCOPY  2002  . DILATATION & CURETTAGE/HYSTEROSCOPY WITH MYOSURE N/A 08/22/2015   Procedure: DILATATION & CURETTAGE/HYSTEROSCOPY WITH MYOSURE AND POLYPECTOMY;  Surgeon: Lori Phelps, Lori Phelps;  Location: [facility];  Service: Gynecology;  Laterality: N/A;  . NASAL SINUS SURGERY     [id number],1981    ROS- all systems are reviewed and negatives except as per HPI above  Current Outpatient Prescriptions  Medication Sig Dispense Refill  . albuterol (PROVENTIL HFA;VENTOLIN HFA) 108 (90 BASE) MCG/ACT inhaler Inhale 2 puffs into the lungs every 6 (six) hours as needed for wheezing or shortness of breath.    . Ascorbic Acid (VITAMIN C PO) Take 1 tablet by mouth daily.    Marland Kitchen. aspirin EC 81 MG tablet Take 162 mg by mouth 2 (two) times daily.    . Coenzyme Q10 (CO Q 10 PO) Take 1 capsule by mouth daily.    Marland Kitchen. diltiazem (CARDIZEM CD) 240 MG 24 hr capsule Take 1 capsule (240 mg total) by mouth daily. 90 capsule 3  . flecainide (TAMBOCOR) 50 MG tablet Take 1 tablet (50 mg total) by mouth 2 (two) times daily. 180 tablet 3  . Fluticasone-Salmeterol (ADVAIR DISKUS) 250-50  MCG/DOSE AEPB Inhale 1 puff into the lungs every 12 (twelve) hours.      . furosemide (LASIX) 20 MG tablet Take 1 tablet (20 mg total) by mouth daily. 30 tablet 0  . levothyroxine (SYNTHROID, LEVOTHROID) 25 MCG tablet Take 25 mcg by mouth daily.      . metFORMIN (GLUCOPHAGE) 500 MG tablet Take 500 mg by mouth 2 (two) times daily with a meal.    . metoprolol tartrate (LOPRESSOR) 25 MG tablet Take 1 tablet (25 mg total) by mouth 2 (two) times daily. 60 tablet 11  . Multiple Vitamin (MULTIVITAMIN) capsule Take 1 capsule by mouth daily.    . Omega-3 Fatty Acids (FISH OIL PO) Take 2 capsules by mouth daily.    Marland Kitchen. omeprazole (PRILOSEC) 20 MG capsule Take 20 mg by mouth daily.      . simvastatin (ZOCOR) 20 MG tablet Take 1 tablet (20 mg total) by mouth at bedtime. 90 tablet 2   No current facility-administered medications for this visit.     Physical Exam: Vitals:   02/23/17 0958  BP: 136/78  Pulse: 62  SpO2: 97%  Weight: 199 lb 3.2 oz (90.4 kg)  Height: 5\' 1"  (1.549 m)    GEN- The patient is well appearing, alert and oriented x 3 today.   Head- normocephalic, atraumatic Eyes-  Sclera clear, conjunctiva pink Ears- hearing intact Oropharynx- clear  Lungs- Clear to ausculation bilaterally, normal work of breathing Heart- Regular rate and rhythm, no murmurs, rubs or gallops, PMI not laterally displaced GI- soft, NT, ND, + BS Extremities- no clubbing, cyanosis, or edema  EKG tracing ordered today is personally reviewed and shows sinus rhythm, normal ekg  Assessment and Plan:  1. Palpitations (PACs, nonsustained atach, nonsustained afib) Well controlled No changes Would defer anticoagulation to more sustained arrhythmias  2. Overweight Body mass index is 37.64 kg/m. Weight loss would be beneficial  3. HTN Stable No change required today  Follow-up with Dr Lori Phelps in 6 months Return to see EP NP every year I will see when needed   Lori Phelps, Lori Phelps 02/23/2017 10:21  AM

## 2017-03-10 ENCOUNTER — Ambulatory Visit: Admitting: Cardiovascular Disease

## 2017-04-27 ENCOUNTER — Other Ambulatory Visit: Payer: Self-pay | Admitting: Obstetrics and Gynecology

## 2017-04-27 DIAGNOSIS — Z1231 Encounter for screening mammogram for malignant neoplasm of breast: Secondary | ICD-10-CM

## 2017-05-13 ENCOUNTER — Ambulatory Visit (INDEPENDENT_AMBULATORY_CARE_PROVIDER_SITE_OTHER): Admitting: Sports Medicine

## 2017-05-13 DIAGNOSIS — E119 Type 2 diabetes mellitus without complications: Secondary | ICD-10-CM | POA: Diagnosis not present

## 2017-05-13 DIAGNOSIS — B351 Tinea unguium: Secondary | ICD-10-CM

## 2017-05-13 DIAGNOSIS — M79674 Pain in right toe(s): Secondary | ICD-10-CM

## 2017-05-13 DIAGNOSIS — L84 Corns and callosities: Secondary | ICD-10-CM

## 2017-05-13 DIAGNOSIS — M79675 Pain in left toe(s): Secondary | ICD-10-CM

## 2017-05-13 NOTE — Progress Notes (Signed)
Subjective: Lori Phelps is a 61 y.o. female patient with history of diabetes who presents to office today complaining of long, painful nails  while ambulating in shoes; unable to trim. Patient states that the glucose reading this morning was good, always below 100 as previously noted, last A1c was 5.3. Saw PCP 1 month ago. Patient denies any new changes in medication or new problems.  Patient Active Problem List   Diagnosis Date Noted  . Atrial tachycardia ([place])   . HTN (hypertension)   . HLD (hyperlipidemia)    Current Outpatient Prescriptions on File Prior to Visit  Medication Sig Dispense Refill  . albuterol (PROVENTIL HFA;VENTOLIN HFA) 108 (90 BASE) MCG/ACT inhaler Inhale 2 puffs into the lungs every 6 (six) hours as needed for wheezing or shortness of breath.    . Ascorbic Acid (VITAMIN C PO) Take 1 tablet by mouth daily.    Marland Kitchen aspirin EC 81 MG tablet Take 81 mg by mouth daily.    . Coenzyme Q10 (CO Q 10 PO) Take 1 capsule by mouth daily.    Marland Kitchen diltiazem (CARDIZEM CD) 240 MG 24 hr capsule Take 1 capsule (240 mg total) by mouth daily. 90 capsule 3  . flecainide (TAMBOCOR) 50 MG tablet Take 1 tablet (50 mg total) by mouth 2 (two) times daily. 180 tablet 3  . Fluticasone-Salmeterol (ADVAIR DISKUS) 250-50 MCG/DOSE AEPB Inhale 1 puff into the lungs every 12 (twelve) hours.      . furosemide (LASIX) 20 MG tablet Take 1 tablet (20 mg total) by mouth daily. 90 tablet 3  . levothyroxine (SYNTHROID, LEVOTHROID) 25 MCG tablet Take 25 mcg by mouth daily.      . metFORMIN (GLUCOPHAGE) 500 MG tablet Take 500 mg by mouth 2 (two) times daily with a meal.    . metoprolol tartrate (LOPRESSOR) 25 MG tablet Take 1 tablet (25 mg total) by mouth 2 (two) times daily. 180 tablet 3  . Multiple Vitamin (MULTIVITAMIN) capsule Take 1 capsule by mouth daily.    . Omega-3 Fatty Acids (FISH OIL PO) Take 2 capsules by mouth daily.    Marland Kitchen omeprazole (PRILOSEC) 20 MG capsule Take 20 mg by mouth daily.      . simvastatin  (ZOCOR) 20 MG tablet Take 1 tablet (20 mg total) by mouth at bedtime. 90 tablet 3   No current facility-administered medications on file prior to visit.    Allergies  Allergen Reactions  . Penicillins Hives    Has patient had a PCN reaction causing immediate rash, facial/tongue/throat swelling, SOB or lightheadedness with hypotension: No Has patient had a PCN reaction causing severe rash involving mucus membranes or skin necrosis: No Has patient had a PCN reaction that required hospitalization No Has patient had a PCN reaction occurring within the last 10 years: No If all of the above answers are "NO", then may proceed with Cephalosporin use.   . Sulfonamide Derivatives Rash    No results found for this or any previous visit (from the past 2160 hour(s)).  Objective: General: Patient is awake, alert, and oriented x 3 and in no acute distress.  Integument: Skin is warm, dry and supple bilateral. Nails are tender, long, thickened and  dystrophic with subungual debris, consistent with onychomycosis, 1-5 bilateral with distal lysis, secondary to microtrauma. Bilateral hallux. Callus sub met 5 on left. No signs of infection. No open lesions or preulcerative lesions present bilateral. Remaining integument unremarkable.  Vasculature:  Dorsalis Pedis pulse 1/4 bilateral. Posterior Tibial pulse  1/4 bilateral.  Capillary fill time <3 sec 1-5 bilateral. Positive hair growth to the level of the digits. Temperature gradient within normal limits. No varicosities present bilateral. No edema present bilateral.   Neurology: The patient has intact sensation measured with a 5.07/10g Semmes Weinstein Monofilament at all pedal sites bilateral . Vibratory sensation intact bilateral with tuning fork. No Babinski sign present bilateral.   Musculoskeletal: No symptomatic pedal deformities noted bilateral. Pes planus foot type bilateral. Muscular strength 5/5 in all lower extremity muscular groups bilateral  without pain on range of motion . No tenderness with calf compression bilateral.  Assessment and Plan: Problem List Items Addressed This Visit    None    Visit Diagnoses    Dermatophytosis of nail    -  Primary   Callus of foot       Toe pain, bilateral       Diabetes mellitus without complication ([place])         -Examined patient. -Discussed and educated patient on diabetic foot care, especially with  regards to the vascular, neurological and musculoskeletal systems.  -Stressed the importance of good glycemic control and the detriment of not  controlling glucose levels in relation to the foot. -Mechanically debrided callus x 1 using sterile chisel blade and all nails 1-5 bilateral using sterile nail nipper and filed with dremel without incident  -Recommend good supportive shoes daily  -Answered all patient questions -Patient to return  in 3 months for at risk foot care -Patient advised to call the office if any problems or questions arise in the meantime.  Landis Martins, DPM

## 2017-06-15 ENCOUNTER — Ambulatory Visit
Admission: RE | Admit: 2017-06-15 | Discharge: 2017-06-15 | Disposition: A | Discharge status: Home / Self Care | Source: Ambulatory Visit | Attending: Obstetrics and Gynecology | Admitting: Obstetrics and Gynecology

## 2017-06-15 DIAGNOSIS — Z1231 Encounter for screening mammogram for malignant neoplasm of breast: Secondary | ICD-10-CM

## 2017-08-12 ENCOUNTER — Ambulatory Visit: Admitting: Sports Medicine

## 2017-08-25 ENCOUNTER — Ambulatory Visit: Admitting: Cardiovascular Disease

## 2017-09-15 ENCOUNTER — Encounter: Payer: Self-pay | Admitting: Cardiovascular Disease

## 2017-09-15 ENCOUNTER — Ambulatory Visit (INDEPENDENT_AMBULATORY_CARE_PROVIDER_SITE_OTHER): Admitting: Cardiovascular Disease

## 2017-09-15 VITALS — BP 134/66 | HR 55 | Ht 64.0 in | Wt 213.0 lb

## 2017-09-15 DIAGNOSIS — I10 Essential (primary) hypertension: Secondary | ICD-10-CM

## 2017-09-15 DIAGNOSIS — I471 Supraventricular tachycardia: Secondary | ICD-10-CM | POA: Diagnosis not present

## 2017-09-15 NOTE — Progress Notes (Signed)
Cardiology Office Note   Date:  09/15/2017   ID:  Lori Primaatti M Seever, DOB 1956/04/06, MRN [id number]  PCP:  Abigail MiyamotoPerry, Lawrence Edward, MD  Cardiologist:   Lorine BearsMuhammad Arida, MD   Chief Complaint  Patient presents with  . Follow-up      History of Present Illness: Lori Phelps is a 62 y.o. female who presents for a follow-up visit regarding palpitations due to nonsustained atrial tachycardia and brief nonsustained atrial fibrillation.  She has type 2 diabetes and hypothyroidism. She has been followed by Dr. Johney FrameAllred for arrhythmia.  Symptoms have been controlled with flecainide, diltiazem and metoprolol. Echocardiogram in July 2017 showed normal LV systolic function with no significant valvular abnormalities. She has been doing well and denies any chest pain, shortness of breath or palpitations.  No dizziness.  Past Medical History:  Diagnosis Date  . Atrial tachycardia ([facility])   . Diabetes mellitus without complication ([facility])   . HLD (hyperlipidemia)   . HTN (hypertension)   . Hx of bronchitis    uses MDI prn and on advair daily but no dx of asthma   . Hypothyroidism   . Overweight   . Palpitations    hx PAC"S    Past Surgical History:  Procedure Laterality Date  . BACK SURGERY  1994  . COLONOSCOPY  2002  . DILATATION & CURETTAGE/HYSTEROSCOPY WITH MYOSURE N/A 08/22/2015   Procedure: DILATATION & CURETTAGE/HYSTEROSCOPY WITH MYOSURE AND POLYPECTOMY;  Surgeon: Huel CoteKathy Richardson, MD;  Location: [facility];  Service: Gynecology;  Laterality: N/A;  . NASAL SINUS SURGERY     [id number]     Current Outpatient Medications  Medication Sig Dispense Refill  . albuterol (PROVENTIL HFA;VENTOLIN HFA) 108 (90 BASE) MCG/ACT inhaler Inhale 2 puffs into the lungs every 6 (six) hours as needed for wheezing or shortness of breath.    . Ascorbic Acid (VITAMIN C PO) Take 1 tablet by mouth daily.    Marland Kitchen. aspirin EC 81 MG tablet Take 81 mg by mouth daily.    . Coenzyme Q10 (CO Q 10 PO) Take 1 capsule by mouth  daily.    Marland Kitchen. diltiazem (CARDIZEM CD) 240 MG 24 hr capsule Take 1 capsule (240 mg total) by mouth daily. 90 capsule 3  . flecainide (TAMBOCOR) 50 MG tablet Take 1 tablet (50 mg total) by mouth 2 (two) times daily. 180 tablet 3  . Fluticasone-Salmeterol (ADVAIR DISKUS) 250-50 MCG/DOSE AEPB Inhale 1 puff into the lungs every 12 (twelve) hours.      . furosemide (LASIX) 20 MG tablet Take 1 tablet (20 mg total) by mouth daily. 90 tablet 3  . levothyroxine (SYNTHROID, LEVOTHROID) 25 MCG tablet Take 25 mcg by mouth daily.      . metFORMIN (GLUCOPHAGE) 500 MG tablet Take 500 mg by mouth 2 (two) times daily with a meal.    . metoprolol tartrate (LOPRESSOR) 25 MG tablet Take 1 tablet (25 mg total) by mouth 2 (two) times daily. 180 tablet 3  . Multiple Vitamin (MULTIVITAMIN) capsule Take 1 capsule by mouth daily.    . Omega-3 Fatty Acids (FISH OIL PO) Take 2 capsules by mouth daily.    Marland Kitchen. omeprazole (PRILOSEC) 20 MG capsule Take 20 mg by mouth daily.      . simvastatin (ZOCOR) 20 MG tablet Take 1 tablet (20 mg total) by mouth at bedtime. 90 tablet 3   No current facility-administered medications for this visit.     Allergies:   Penicillins and Sulfonamide derivatives  Social History:  The patient  reports that she has quit smoking. she has never used smokeless tobacco. She reports that she does not drink alcohol or use drugs.   Family History:  The patient's family history includes Heart failure in her father; Kidney disease in her mother and unknown relative.    ROS:  Please see the history of present illness.   Otherwise, review of systems are positive for none.   All other systems are reviewed and negative.    PHYSICAL EXAM: VS:  BP 134/66   Pulse (!) 55   Ht 5\' 4"  (1.626 m)   Wt 213 lb (96.6 kg)   LMP 05/21/2015 Comment: irregular since age 9   BMI 36.56 kg/m  , BMI Body mass index is 36.56 kg/m. GEN: Well nourished, well developed, in no acute distress  HEENT: normal  Neck: no JVD,  carotid bruits, or masses Cardiac: RRR; no murmurs, rubs, or gallops,no edema  Respiratory:  clear to auscultation bilaterally, normal work of breathing GI: soft, nontender, nondistended, + BS MS: no deformity or atrophy  Skin: warm and dry, no rash Neuro:  Strength and sensation are intact Psych: euthymic mood, full affect   EKG:  EKG is ordered today. The ekg ordered today demonstrates sinus bradycardia with a heart rate of 55 bpm.   Recent Labs: No results found for requested labs within last 8760 hours.    Lipid Panel No results found for: CHOL, TRIG, HDL, CHOLHDL, VLDL, LDLCALC, LDLDIRECT    Wt Readings from Last 3 Encounters:  09/15/17 213 lb (96.6 kg)  02/23/17 199 lb 3.2 oz (90.4 kg)  08/27/16 189 lb (85.7 kg)       ASSESSMENT AND PLAN:  1.  Palpitations due to PACs, nonsustained atrial tachycardia and nonsustained atrial fibrillation:  Symptoms are well controlled on current medications.  I made no changes.  2. Essential hypertension: Blood pressure is reasonably controlled on current medications.  3. Diabetes mellitus:  managed by primary care physician. 4.  Hyperlipidemia: Currently on simvastatin.    Disposition:   Continue to follow-up in the EP clinic.  Follow-up with me as needed.  Signed,  Lorine Bears, MD  09/15/2017 8:32 AM    [facility]

## 2017-09-15 NOTE — Patient Instructions (Addendum)
Medication Instructions:  Your physician recommends that you continue on your current medications as directed. Please refer to the Current Medication list given to you today.  Labwork: NONE  Testing/Procedures: NONE  Follow-Up: AS NEEDED WITH DR ARIDA   CONTINUE TO FOLLOW UP WITH DR Johney FrameALLRED

## 2018-04-05 ENCOUNTER — Encounter: Payer: Self-pay | Admitting: Nurse Practitioner

## 2018-04-05 ENCOUNTER — Other Ambulatory Visit: Payer: Self-pay | Admitting: Internal Medicine

## 2018-04-05 MED ORDER — FLECAINIDE ACETATE 50 MG PO TABS: 50.0000 mg | ORAL_TABLET | Freq: Two times a day (BID) | ORAL | 0 refills | Status: DC

## 2018-04-05 MED ORDER — DILTIAZEM HCL ER COATED BEADS 240 MG PO CP24: 240.0000 mg | ORAL_CAPSULE | Freq: Every day | ORAL | 0 refills | Status: DC

## 2018-04-05 MED ORDER — METOPROLOL TARTRATE 25 MG PO TABS: 25.0000 mg | ORAL_TABLET | Freq: Two times a day (BID) | ORAL | 0 refills | Status: DC

## 2018-04-05 MED ORDER — FUROSEMIDE 20 MG PO TABS
20.0000 mg | ORAL_TABLET | Freq: Every day | ORAL | 0 refills | Status: DC
Start: 2018-04-05 — End: 2018-10-18

## 2018-04-05 NOTE — Telephone Encounter (Signed)
Pt's medications have already been sent to pt's pharmacy as requested. Confirmation received.  

## 2018-04-05 NOTE — Telephone Encounter (Signed)
Pt's medications were sent to pt's pharmacy as requested. Confirmation received.  

## 2018-04-05 NOTE — Telephone Encounter (Signed)
Ok to refill.  Pt sees Lori Phelps next month.

## 2018-04-16 ENCOUNTER — Encounter: Payer: Self-pay | Admitting: Nurse Practitioner

## 2018-05-04 NOTE — Progress Notes (Signed)
Electrophysiology Office Note Date: 05/07/2018  ID:  Lori Phelps, Lori Phelps 1956/01/30, MRN [id number]  PCP: Abigail Miyamoto, MD Primary Cardiologist: Kirke Corin Electrophysiologist: Allred  CC: follow up atrial arrhythmias  Lori Phelps is a 62 y.o. female seen today for Dr Johney Frame.  She presents today for routine electrophysiology followup.  Since last being seen in our clinic, the patient reports doing very well.  She denies chest pain, palpitations (except for rarely), dyspnea, PND, orthopnea, nausea, vomiting, dizziness, syncope, edema, weight gain, or early satiety.  Past Medical History:  Diagnosis Date  . Atrial tachycardia (H[facility])   . Diabetes mellitus without complication ([facility])   . HLD (hyperlipidemia)   . HTN (hypertension)   . Hx of bronchitis    uses MDI prn and on advair daily but no dx of asthma   . Hypothyroidism   . Overweight   . Palpitations    hx PAC"S   Past Surgical History:  Procedure Laterality Date  . BACK SURGERY  1994  . COLONOSCOPY  2002  . DILATATION & CURETTAGE/HYSTEROSCOPY WITH MYOSURE N/A 08/22/2015   Procedure: DILATATION & CURETTAGE/HYSTEROSCOPY WITH MYOSURE AND POLYPECTOMY;  Surgeon: Huel Cote, MD;  Location: [facility];  Service: Gynecology;  Laterality: N/A;  . NASAL SINUS SURGERY     [id number]    Current Outpatient Medications  Medication Sig Dispense Refill  . albuterol (PROVENTIL HFA;VENTOLIN HFA) 108 (90 BASE) MCG/ACT inhaler Inhale 2 puffs into the lungs every 6 (six) hours as needed for wheezing or shortness of breath.    . Ascorbic Acid (VITAMIN C PO) Take 1 tablet by mouth daily.    Marland Kitchen aspirin EC 81 MG tablet Take 81 mg by mouth daily.    . Coenzyme Q10 (CO Q 10 PO) Take 1 capsule by mouth daily.    Marland Kitchen diltiazem (CARDIZEM CD) 240 MG 24 hr capsule Take 1 capsule (240 mg total) by mouth daily. Please keep upcoming appt for future refills. Thank you 90 capsule 0  . flecainide (TAMBOCOR) 50 MG tablet Take 1 tablet (50 mg total)  by mouth 2 (two) times daily. May take an extra tablet for palpitations 180 tablet 0  . Fluticasone-Salmeterol (ADVAIR DISKUS) 250-50 MCG/DOSE AEPB Inhale 1 puff into the lungs every 12 (twelve) hours.      . furosemide (LASIX) 20 MG tablet Take 1 tablet (20 mg total) by mouth daily. Please keep upcoming appt for future refills. Thank you 90 tablet 0  . levothyroxine (SYNTHROID, LEVOTHROID) 25 MCG tablet Take 25 mcg by mouth daily.      . metFORMIN (GLUCOPHAGE) 500 MG tablet Take 500 mg by mouth 2 (two) times daily with a meal.    . metoprolol tartrate (LOPRESSOR) 25 MG tablet Take 1 tablet (25 mg total) by mouth 2 (two) times daily. Please keep upcoming appt for future refills. Thank you 180 tablet 0  . Multiple Vitamin (MULTIVITAMIN) capsule Take 1 capsule by mouth daily.    . Omega-3 Fatty Acids (FISH OIL PO) Take 2 capsules by mouth daily.    Marland Kitchen omeprazole (PRILOSEC) 20 MG capsule Take 20 mg by mouth daily.      . simvastatin (ZOCOR) 20 MG tablet Take 1 tablet (20 mg total) by mouth at bedtime. 90 tablet 3   No current facility-administered medications for this visit.     Allergies:   Penicillins and Sulfonamide derivatives   Social History: Social History   Socioeconomic History  . Marital status: Married  Spouse name: Not on file  . Number of children: 2  . Years of education: Not on file  . Highest education level: Not on file  Occupational History  . Not on file  Social Needs  . Financial resource strain: Not on file  . Food insecurity:    Worry: Not on file    Inability: Not on file  . Transportation needs:    Medical: Not on file    Non-medical: Not on file  Tobacco Use  . Smoking status: Former Games developer  . Smokeless tobacco: Never Used  Substance and Sexual Activity  . Alcohol use: No    Alcohol/week: 0.0 standard drinks  . Drug use: No  . Sexual activity: Not on file  Lifestyle  . Physical activity:    Days per week: Not on file    Minutes per session: Not on  file  . Stress: Not on file  Relationships  . Social connections:    Talks on phone: Not on file    Gets together: Not on file    Attends religious service: Not on file    Active member of club or organization: Not on file    Attends meetings of clubs or organizations: Not on file    Relationship status: Not on file  . Intimate partner violence:    Fear of current or ex partner: Not on file    Emotionally abused: Not on file    Physically abused: Not on file    Forced sexual activity: Not on file  Other Topics Concern  . Not on file  Social History Narrative   Pt lives in [place] with spouse.  Retired Engineer, water for [employer].    Family History: Family History  Problem Relation Age of Onset  . Kidney disease Unknown   . Kidney disease Mother   . Heart failure Father   . Colon cancer Neg Hx   . Colon polyps Neg Hx   . Esophageal cancer Neg Hx   . Rectal cancer Neg Hx   . Stomach cancer Neg Hx     Review of Systems: All other systems reviewed and are otherwise negative except as noted above.   Physical Exam: VS:  BP 132/84   Pulse (!) 56   Ht 5\' 4"  (1.626 m)   Wt 227 lb 12.8 oz (103.3 kg)   LMP 05/21/2015 Comment: irregular since age 67   SpO2 97%   BMI 39.10 kg/m  , BMI Body mass index is 39.1 kg/m. Wt Readings from Last 3 Encounters:  05/07/18 227 lb 12.8 oz (103.3 kg)  09/15/17 213 lb (96.6 kg)  02/23/17 199 lb 3.2 oz (90.4 kg)    GEN- The patient is well appearing, alert and oriented x 3 today.   HEENT: normocephalic, atraumatic; sclera clear, conjunctiva pink; hearing intact; oropharynx clear; neck supple, no JVP Lymph- no cervical lymphadenopathy Lungs- Clear to ausculation bilaterally, normal work of breathing.  No wheezes, rales, rhonchi Heart- Regular rate and rhythm, no murmurs, rubs or gallops, PMI not laterally displaced GI- soft, non-tender, non-distended, bowel sounds present, no hepatosplenomegaly Extremities- no  clubbing, cyanosis, or edema; DP/PT/radial pulses 2+ bilaterally MS- no significant deformity or atrophy Skin- warm and dry, no rash or lesion  Psych- euthymic mood, full affect Neuro- strength and sensation are intact   EKG:  EKG is ordered today. The ekg ordered today shows sinus brady, rate 56, PR , QRS , QTc  Recent Labs: No results  found for requested labs within last 8760 hours.    Other studies Reviewed: Additional studies/ records that were reviewed today include: Dr Johney FrameAllred and Dr Jari SportsmanArida's office notes   Assessment and Plan:  1.  PAC's, atrial tachycardia Doing well on Flecainide EKG stable today  2.  HTN Stable No change required today    Current medicines are reviewed at length with the patient today.   The patient does not have concerns regarding her medicines.  The following changes were made today:  none  Labs/ tests ordered today include: none Orders Placed This Encounter  Procedures  . EKG 12-Lead     Disposition:   Follow up with me every year, Dr Kirke CorinArida as scheduled     Signed, Gypsy BalsamAmber Harish Bram, NP 05/07/2018 9:19 AM   [facility] [address] [place] [address] [phone] (office) [phone] (fax)

## 2018-05-07 ENCOUNTER — Encounter: Payer: Self-pay | Admitting: Nurse Practitioner

## 2018-05-07 ENCOUNTER — Ambulatory Visit (INDEPENDENT_AMBULATORY_CARE_PROVIDER_SITE_OTHER): Admitting: Nurse Practitioner

## 2018-05-07 VITALS — BP 132/84 | HR 56 | Ht 64.0 in | Wt 227.8 lb

## 2018-05-07 DIAGNOSIS — I471 Supraventricular tachycardia: Secondary | ICD-10-CM | POA: Diagnosis not present

## 2018-05-07 DIAGNOSIS — I10 Essential (primary) hypertension: Secondary | ICD-10-CM

## 2018-05-07 MED ORDER — FLECAINIDE ACETATE 50 MG PO TABS: 50.0000 mg | ORAL_TABLET | Freq: Two times a day (BID) | ORAL | 0 refills | Status: DC

## 2018-05-07 NOTE — Patient Instructions (Addendum)
Medication Instructions:   YOU MAT TAKE AN EXTRA FLECAINIDE FOR PALPITATION   If you need a refill on your cardiac medications before your next appointment, please call your pharmacy.  Labwork: NONE ORDERED  TODAY   Testing/Procedures: NONE ORDERED  TODAY   Follow-Up:  Your physician wants you to follow-up in:  IN  6  MONTHS WITH DR Kirke CorinARIDA You will receive a reminder letter in the mail two months in advance. If you don't receive a letter, please call our office to schedule the follow-up appointment.  Your physician wants you to follow-up in: ONE YEAR WITH  SEILER.Marland Kitchen.You will receive a reminder letter in the mail two months in advance. If you don't receive a letter, please call our office to schedule the follow-up appointment.   Any Other Special Instructions Will Be Listed Below (If Applicable).

## 2018-06-28 ENCOUNTER — Other Ambulatory Visit: Payer: Self-pay | Admitting: Obstetrics and Gynecology

## 2018-06-28 DIAGNOSIS — Z1231 Encounter for screening mammogram for malignant neoplasm of breast: Secondary | ICD-10-CM

## 2018-08-04 ENCOUNTER — Ambulatory Visit

## 2018-09-09 ENCOUNTER — Ambulatory Visit

## 2018-10-05 ENCOUNTER — Ambulatory Visit
Admission: RE | Admit: 2018-10-05 | Discharge: 2018-10-05 | Disposition: A | Discharge status: Home / Self Care | Source: Ambulatory Visit | Attending: Obstetrics and Gynecology | Admitting: Obstetrics and Gynecology

## 2018-10-05 DIAGNOSIS — Z1231 Encounter for screening mammogram for malignant neoplasm of breast: Secondary | ICD-10-CM

## 2018-10-15 ENCOUNTER — Telehealth: Payer: Self-pay | Admitting: *Deleted

## 2018-10-15 NOTE — Progress Notes (Addendum)
Cardiology Office Note Date:  10/18/2018  Patient ID:  Lori Phelps, DOB November 16, 1955, MRN [id number] PCP:  Abigail MiyamotoPerry, Lawrence Edward, MD  Cardiologist:  Dr. Kirke CorinArida, MD Electrophysiologist: Dr. Johney FrameAllred, MD     Chief Complaint: Elevated BP  History of Present Illness: Lori Phelps is a 63 y.o. female with history of nonsustained atrial tachycardia, possible brief nonsustained Afib, PACs, DM2, HTN, HLD, and hypothyroidism who presents for evaluation of elevated blood pressures.   She is followed by Dr. Johney FrameAllred for her arrhythmia with Holter in 2017 being unable to exclude Afib. Her symptoms have been controlled by flecainide, diltiazem, and metoprolol. Most recent echo from 03/2016 showed an EF of 60-65%, mild LVH, no RWMA, Gr1DD.   She contacted our office on 2/7 noting elevated BP over the prior week with readings into the 190s-200/90s-100s with heart rates in the 50s-60s bpm. She denied any changes and compliance with medications. She did note recent diagnosis of bronchitis which was being managed by her PCP. She reported in a separate communication to our office on 2/7 her BP was 210/97 at her PCP's office on 2/3 and was given a "yellow pill," which improved her BP to 145/86. She was also started on lisinopril 20 mg daily at that time.  She comes in today noting a several week to several month history of elevated BP readings.  She has been under increased stress at home with her son being quite sick recently and this is weighing on her mind at all times.  Blood pressure will frequently be in the 180s in the morning prior to taking her medications.  Historically, her blood pressure typically runs in the 130s systolic.  She has recently acquired a new BP cuff and has transitioned from a wrist cuff to a brachial cuff.  She denies any dietary changes and is compliant with medications.  She has recently put on approximately 20 pounds and she feels like this too may be contributing to her elevated BP  readings.  She drinks approximately 16 ounces of unsweetened tea daily otherwise denies any caffeine intake.  She denies any tobacco or alcohol abuse.  No lower extremity swelling, orthopnea, PND, or early satiety.  No chest pain, shortness of breath, palpitations, dizziness, presyncope, or syncope.   Past Medical History:  Diagnosis Date  . Atrial tachycardia (HCC)   . Diabetes mellitus without complication (HCC)   . HLD (hyperlipidemia)   . HTN (hypertension)   . Hx of bronchitis    uses MDI prn and on advair daily but no dx of asthma   . Hypothyroidism   . Overweight   . Palpitations    hx PAC"S    Past Surgical History:  Procedure Laterality Date  . BACK SURGERY  1994  . COLONOSCOPY  2002  . DILATATION & CURETTAGE/HYSTEROSCOPY WITH MYOSURE N/A 08/22/2015   Procedure: DILATATION & CURETTAGE/HYSTEROSCOPY WITH MYOSURE AND POLYPECTOMY;  Surgeon: Huel CoteKathy Richardson, MD;  Location: [facility];  Service: Gynecology;  Laterality: N/A;  . NASAL SINUS SURGERY     [id number]    Current Meds  Medication Sig  . albuterol (PROVENTIL HFA;VENTOLIN HFA) 108 (90 BASE) MCG/ACT inhaler Inhale 2 puffs into the lungs every 6 (six) hours as needed for wheezing or shortness of breath.  . Ascorbic Acid (VITAMIN C PO) Take 1 tablet by mouth daily.  Marland Kitchen. aspirin EC 81 MG tablet Take 81 mg by mouth daily.  . Coenzyme Q10 (CO Q 10 PO) Take 1 capsule by mouth daily.  .Marland Kitchen  diltiazem (CARDIZEM CD) 240 MG 24 hr capsule Take 1 capsule (240 mg total) by mouth daily. Please keep upcoming appt for future refills. Thank you  . flecainide (TAMBOCOR) 50 MG tablet Take 1 tablet (50 mg total) by mouth 2 (two) times daily. May take an extra tablet for palpitations  . Fluticasone-Salmeterol (ADVAIR DISKUS) 250-50 MCG/DOSE AEPB Inhale 1 puff into the lungs every 12 (twelve) hours.    . furosemide (LASIX) 20 MG tablet Take 1 tablet (20 mg total) by mouth daily. Please keep upcoming appt for future refills. Thank you  . levothyroxine  (SYNTHROID, LEVOTHROID) 25 MCG tablet Take 25 mcg by mouth daily.    Marland Kitchen lisinopril (PRINIVIL,ZESTRIL) 20 MG tablet Take 20 mg by mouth daily.  . metFORMIN (GLUCOPHAGE) 500 MG tablet Take 500 mg by mouth 2 (two) times daily with a meal.  . metoprolol tartrate (LOPRESSOR) 25 MG tablet Take 1 tablet (25 mg total) by mouth 2 (two) times daily. Please keep upcoming appt for future refills. Thank you  . Multiple Vitamin (MULTIVITAMIN) capsule Take 1 capsule by mouth daily.  . Omega-3 Fatty Acids (FISH OIL PO) Take 2 capsules by mouth daily.  Marland Kitchen omeprazole (PRILOSEC) 20 MG capsule Take 20 mg by mouth daily.    . simvastatin (ZOCOR) 20 MG tablet Take 1 tablet (20 mg total) by mouth at bedtime.    Allergies:   Penicillins and Sulfonamide derivatives   Social History:  The patient  reports that she has quit smoking. She has never used smokeless tobacco. She reports that she does not drink alcohol or use drugs.   Family History:  The patient's family history includes Heart failure in her father; Kidney disease in her mother and another family member.  ROS:   Review of Systems  Constitutional: Negative for chills, diaphoresis, fever, malaise/fatigue and weight loss.  HENT: Negative for congestion.   Eyes: Negative for discharge and redness.  Respiratory: Negative for cough, hemoptysis, sputum production, shortness of breath and wheezing.   Cardiovascular: Negative for chest pain, palpitations, orthopnea, claudication, leg swelling and PND.  Gastrointestinal: Negative for abdominal pain, blood in stool, heartburn, melena, nausea and vomiting.  Genitourinary: Negative for hematuria.  Musculoskeletal: Negative for falls and myalgias.  Skin: Negative for rash.  Neurological: Positive for headaches. Negative for dizziness, tingling, tremors, sensory change, speech change, focal weakness, loss of consciousness and weakness.  Endo/Heme/Allergies: Does not bruise/bleed easily.  Psychiatric/Behavioral:  Negative for substance abuse. The patient is nervous/anxious.   All other systems reviewed and are negative.    PHYSICAL EXAM:  VS:  BP (!) 148/80 (BP Location: Left Arm, Patient Position: Sitting, Cuff Size: Large)   Pulse (!) 58   Ht 5\' 1"  (1.549 m)   Wt 222 lb (100.7 kg)   LMP 05/21/2015 Comment: irregular since age 35   BMI 41.95 kg/m  BMI: Body mass index is 41.95 kg/m.  Physical Exam  Constitutional: She is oriented to person, place, and time. She appears well-developed and well-nourished.  HENT:  Head: Normocephalic and atraumatic.  Eyes: Right eye exhibits no discharge. Left eye exhibits no discharge.  Neck: Normal range of motion. No JVD present.  Cardiovascular: Normal rate, regular rhythm, S1 normal, S2 normal and normal heart sounds. Exam reveals no distant heart sounds, no friction rub, no midsystolic click and no opening snap.  No murmur heard. Pulses:      Posterior tibial pulses are 2+ on the right side and 2+ on the left side.  Pulmonary/Chest: Effort  normal and breath sounds normal. No respiratory distress. She has no decreased breath sounds. She has no wheezes. She has no rales. She exhibits no tenderness.  Abdominal: Soft. She exhibits no distension. There is no abdominal tenderness.  Musculoskeletal:        General: No edema.  Neurological: She is alert and oriented to person, place, and time.  Skin: Skin is warm and dry. No cyanosis. Nails show no clubbing.  Psychiatric: She has a normal mood and affect. Her speech is normal and behavior is normal. Judgment and thought content normal.     EKG:  Was ordered and interpreted by me today. Shows sinus bradycardia, 58 bpm, no acute ST-T changes  Recent Labs: No results found for requested labs within last 8760 hours.  No results found for requested labs within last 8760 hours.   CrCl cannot be calculated (Patient's most recent lab result is older than the maximum 21 days allowed.).   Wt Readings from Last 3  Encounters:  10/18/18 222 lb (100.7 kg)  05/07/18 227 lb 12.8 oz (103.3 kg)  09/15/17 213 lb (96.6 kg)     Other studies reviewed: Additional studies/records reviewed today include: summarized above  ASSESSMENT AND PLAN:  1. Hypertension: Likely exacerbated by increased stress at home and weight gain.  Increase lisinopril to 20 mg twice daily in an effort to improve morning blood pressure readings.  She will otherwise continue Cardizem CD, Lasix, and Lopressor.  Weight loss is advised.  If BP continues to be elevated following the above medication changes and improve stress at home, consider referral to pulmonology for sleep study, along with further work-up for secondary hypertension including renal artery ultrasound and laboratory analysis.  Check TSH, BMP, and CBC after she has been on lisinopril for approximately 1 week.  Orders have been placed.  2. Atrial tachycardia/PVCs: Followed by EP and well controlled.  Refill flecainide, diltiazem, and metoprolol.  3. Obesity: Weight loss is advised.  4. Stress: Likely playing a role in her elevated blood pressure readings.  Disposition: F/u with Dr. Kirke CorinArida in 2 weeks and EP as directed.   Current medicines are reviewed at length with the patient today.  The patient did not have any concerns regarding medicines.  Signed, Eula Listenyan Beverley Sherrard, PA-C 10/18/2018 11:36 AM     [facility] [place] [address] [place], [place] [address] [phone]

## 2018-10-15 NOTE — Telephone Encounter (Signed)
Spoke with the patient. She stated that her blood pressure has been running high lately. Monday when she went to her PCP, her blood pressure was 210/97. They gave her a "yellow pill" to reduce her blood pressure and it went down to 145/86. They also prescribed her Lisinopril 20 mg daily.   She requested an appointment so that cardiology could follow her blood pressure and medications. An appointment has been made for 2/10. She will call back with any concerns.

## 2018-10-15 NOTE — Telephone Encounter (Signed)
Left a message for the patient to call back concerning her patient advice request.

## 2018-10-18 ENCOUNTER — Encounter: Payer: Self-pay | Admitting: Physician Assistant

## 2018-10-18 ENCOUNTER — Ambulatory Visit (INDEPENDENT_AMBULATORY_CARE_PROVIDER_SITE_OTHER): Admitting: Physician Assistant

## 2018-10-18 VITALS — BP 148/80 | HR 58 | Ht 61.0 in | Wt 222.0 lb

## 2018-10-18 DIAGNOSIS — I10 Essential (primary) hypertension: Secondary | ICD-10-CM | POA: Diagnosis not present

## 2018-10-18 DIAGNOSIS — F439 Reaction to severe stress, unspecified: Secondary | ICD-10-CM | POA: Diagnosis not present

## 2018-10-18 DIAGNOSIS — Z6841 Body Mass Index (BMI) 40.0 and over, adult: Secondary | ICD-10-CM

## 2018-10-18 DIAGNOSIS — I471 Supraventricular tachycardia: Secondary | ICD-10-CM

## 2018-10-18 MED ORDER — FLECAINIDE ACETATE 50 MG PO TABS: 50.0000 mg | ORAL_TABLET | Freq: Two times a day (BID) | ORAL | 1 refills | Status: DC

## 2018-10-18 MED ORDER — FUROSEMIDE 20 MG PO TABS: 20.0000 mg | ORAL_TABLET | Freq: Every day | ORAL | 1 refills | Status: DC

## 2018-10-18 MED ORDER — LISINOPRIL 20 MG PO TABS: 20.0000 mg | ORAL_TABLET | Freq: Two times a day (BID) | ORAL | 1 refills | Status: DC

## 2018-10-18 MED ORDER — DILTIAZEM HCL ER COATED BEADS 240 MG PO CP24: 240.0000 mg | ORAL_CAPSULE | Freq: Every day | ORAL | 3 refills | Status: DC

## 2018-10-18 MED ORDER — METOPROLOL TARTRATE 25 MG PO TABS: 25.0000 mg | ORAL_TABLET | Freq: Two times a day (BID) | ORAL | 1 refills | Status: DC

## 2018-10-18 NOTE — Patient Instructions (Signed)
Medication Instructions:  INCREASE the Lisinopril to 20 mg twice daily  If you need a refill on your cardiac medications before your next appointment, please call your pharmacy.   Lab work: Your provider would like for you to return in 1 week to have the following labs drawn: CBC, TSH, BMET.   Follow-Up: At [facility], you and your health needs are our priority.  As part of our continuing mission to provide you with exceptional heart care, we have created designated Provider Care Teams.  These Care Teams include your primary Cardiologist (physician) and Advanced Practice Providers (APPs -  Physician Assistants and Nurse Practitioners) who all work together to provide you with the care you need, when you need it. You will need a follow up appointment in 2 weeks.  Please call our office 2 months in advance to schedule this appointment.  You may see Lorine Bears, MD or one of the following Advanced Practice Providers on your designated Care Team:   Nicolasa Ducking, NP Eula Listen, PA-C

## 2018-10-25 ENCOUNTER — Other Ambulatory Visit (INDEPENDENT_AMBULATORY_CARE_PROVIDER_SITE_OTHER)

## 2018-10-25 DIAGNOSIS — I10 Essential (primary) hypertension: Secondary | ICD-10-CM | POA: Diagnosis not present

## 2018-10-26 ENCOUNTER — Telehealth: Payer: Self-pay | Admitting: Physician Assistant

## 2018-10-26 LAB — BASIC METABOLIC PANEL
BUN / CREAT RATIO: 19 (ref 12–28)
BUN: 15 mg/dL (ref 8–27)
CO2: 23 mmol/L (ref 20–29)
Calcium: 9.9 mg/dL (ref 8.7–10.3)
Chloride: 106 mmol/L (ref 96–106)
Creatinine, Ser: 0.77 mg/dL (ref 0.57–1.00)
GFR calc Af Amer: 96 mL/min/{1.73_m2} (ref >59)
GFR calc non Af Amer: 83 mL/min/{1.73_m2} (ref >59)
Glucose: 88 mg/dL (ref 65–99)
Potassium: 4.6 mmol/L (ref 3.5–5.2)
Sodium: 144 mmol/L (ref 134–144)

## 2018-10-26 LAB — CBC
HEMATOCRIT: 41.1 % (ref 34.0–46.6)
Hemoglobin: 13.7 g/dL (ref 11.1–15.9)
MCH: 27 pg (ref 26.6–33.0)
MCHC: 33.3 g/dL (ref 31.5–35.7)
MCV: 81 fL (ref 79–97)
Platelets: 228 10*3/uL (ref 150–450)
RBC: 5.08 x10E6/uL (ref 3.77–5.28)
RDW: 14.3 % (ref 11.7–15.4)
WBC: 8.2 10*3/uL (ref 3.4–10.8)

## 2018-10-26 LAB — TSH: TSH: 1.93 u[IU]/mL (ref 0.450–4.500)

## 2018-10-26 NOTE — Telephone Encounter (Signed)
I spoke with the patient regarding his BMP/ CBC/ TSH results. She is aware that [place], [place] had seen her BP readings through MyChart. I have advised her of Ryan's recommendations to: 1) have a referral to pulmonary for evaluation of a possible sleep study 2) add hydralazine 25 mg TID for elevated blood pressure.  Per the patient, her home readings have mainly been taken when she first gets up in the morning prior to any medications being taken.  I have advised the patient to skip taking her BP first thing in the morning. She will take her AM meds and then wait about 30 minutes - 1 hour after prior to checking her BP. I have asked that she do this for about 5 days and record the readings.  She states she will do this and bring the readings in/ send via MyChart. She is scheduled to see Alycia Rossetti, [place] on 2/25. She will discuss her BP and referral to pulmonary further with Ryan at that visit. No changes made today.

## 2018-10-26 NOTE — Telephone Encounter (Signed)
Notes recorded by Sondra Barges, PA-C on 10/26/2018 at 6:55 AM EST Random glucose is normal.  Renal function is normal. Potassium is at goal.  Thyroid function is normal. Blood count is normal.   I see she has called with BP remaining in the 130s to 140s systolic. Please refer her to pulmonology for sleep study.  Heart rate precludes titration of beta blocker or calcium channel blocker.  We can add hydralazine 25 mg tid.

## 2018-10-28 ENCOUNTER — Telehealth: Payer: Self-pay | Admitting: Cardiovascular Disease

## 2018-10-28 NOTE — Telephone Encounter (Signed)
Returned the patients call. Unsure to the nature of the call. Left a message on the patient's voicemail that the [facility] office will be closing early this afternoon. She is to call back to the [facility] office is assistance is needed this afternoon.

## 2018-10-28 NOTE — Telephone Encounter (Signed)
Pt says she just received a message on her phone to call the [facility] Office [phone]) but the person calling did not leave their name.

## 2018-10-28 NOTE — Telephone Encounter (Signed)
Spoke to patient stated she was returning some ones call.After reviewing chart unable to know who called.Message sent to Dr.Arida's RN.

## 2018-10-29 NOTE — Telephone Encounter (Signed)
The patient most likely was called concerning her appointment on 2/25 with Eula Listen, PA. She has been called and verified the appointment.

## 2018-11-01 NOTE — Progress Notes (Signed)
Cardiology Office Note Date:  11/02/2018  Patient ID:  Lori Phelps, DOB 16-Sep-1955, MRN [id number] PCP:  Abigail MiyamotoPerry, Lawrence Edward, MD  Cardiologist:  Dr. Kirke CorinArida, MD Electrophysiologist: Dr. Johney FrameAllred, MD    Chief Complaint: Follow up  History of Present Illness: Lori Phelps is a 63 y.o. female with history of nonsustained atrial tachycardia, possible brief nonsustained Afib, PACs, DM2, HTN, HLD, and hypothyroidism who presents for follow up of elevated blood pressures.   She is followed by Dr. Johney FrameAllred for her arrhythmia with Holter in 2017 being unable to exclude Afib. Her symptoms have been controlled by flecainide, diltiazem, and metoprolol. Most recent echo from 03/2016 showed an EF of 60-65%, mild LVH, no RWMA, Gr1DD.   She contacted our office on 2/7 noting elevated BP over the prior week with readings into the 190s-200/90s-100s with heart rates in the 50s-60s bpm. She denied any changes and compliance with medications. She did note recent diagnosis of bronchitis which was being managed by her PCP. She reported in a separate communication to our office on 2/7 her BP was 210/97 at her PCP's office on 2/3 and was given a "yellow pill," which improved her BP to 145/86. She was also started on lisinopril 20 mg daily at that time.  Patient was last evaluated on 10/18/2018 noting a several week to several month history of elevated BP readings in the setting of increased stress at home.  BP was noted to be 148/80 with a heart rate of 58 bpm.  She reported her blood pressure will frequently be in the 180s systolic in the morning prior to taking her medications.  She was noted to have recently put on approximately 20 pounds.  Her lisinopril was increased to 20 mg twice daily.  She was otherwise continued on Cardizem CD, Lasix, and Lopressor.  Weight loss and low-sodium diet was recommended.  Patient sent us several my chart messages noting systolics ranging from the 130s to 170s millimeters mercury.  In  this setting, she was referred to pulmonology for evaluation of possible sleep study and hydralazine 25 mg 3 times daily was added.  Labs: 10/25/2018 - serum creatinine 0.77, potassium 4.6, TSH normal, CBC unremarkable  The patient preferred to hold off on starting hydralazine and to be reevaluated in clinic today.  She comes in doing well from a cardiac perspective.  Blood pressures continue to range from the mid 130s to 160s systolic with most readings in the 150s to 160s systolic.  Heart rate in the mid 50s to low 60s bpm.  She denies any chest pain, shortness of breath, dizziness, presyncope, or syncope.  No lower extremity swelling, abdominal distention, orthopnea, PND, early satiety.  Weight is stable when compared to last office visit.  She continues to be under increased stress at home with the illness of her son who was apparently recently diagnosed with lupus.  She reports compliance with medications and has been trying to watch her sodium intake.  She is fairly certain she snores though denies any significant daytime somnolence.    Past Medical History:  Diagnosis Date  . Atrial tachycardia (HCC)   . Diabetes mellitus without complication (HCC)   . HLD (hyperlipidemia)   . HTN (hypertension)   . Hx of bronchitis    uses MDI prn and on advair daily but no dx of asthma   . Hypothyroidism   . Overweight   . Palpitations    hx PAC"S    Past Surgical History:  Procedure  Laterality Date  . BACK SURGERY  1994  . COLONOSCOPY  2002  . DILATATION & CURETTAGE/HYSTEROSCOPY WITH MYOSURE N/A 08/22/2015   Procedure: DILATATION & CURETTAGE/HYSTEROSCOPY WITH MYOSURE AND POLYPECTOMY;  Surgeon: Huel Cote, MD;  Location: [facility];  Service: Gynecology;  Laterality: N/A;  . NASAL SINUS SURGERY     [id number]    Current Meds  Medication Sig  . albuterol (PROVENTIL HFA;VENTOLIN HFA) 108 (90 BASE) MCG/ACT inhaler Inhale 2 puffs into the lungs every 6 (six) hours as needed for wheezing or  shortness of breath.  . Ascorbic Acid (VITAMIN C PO) Take 1 tablet by mouth daily.  Marland Kitchen aspirin EC 81 MG tablet Take 81 mg by mouth daily.  . Coenzyme Q10 (CO Q 10 PO) Take 1 capsule by mouth daily.  Marland Kitchen diltiazem (CARDIZEM CD) 240 MG 24 hr capsule Take 1 capsule (240 mg total) by mouth daily.  . flecainide (TAMBOCOR) 50 MG tablet Take 1 tablet (50 mg total) by mouth 2 (two) times daily. May take an extra tablet for palpitations  . Fluticasone-Salmeterol (ADVAIR DISKUS) 250-50 MCG/DOSE AEPB Inhale 1 puff into the lungs every 12 (twelve) hours.    . furosemide (LASIX) 20 MG tablet Take 1 tablet (20 mg total) by mouth daily.  Marland Kitchen levothyroxine (SYNTHROID, LEVOTHROID) 25 MCG tablet Take 25 mcg by mouth daily.    Marland Kitchen lisinopril (PRINIVIL,ZESTRIL) 20 MG tablet Take 1 tablet (20 mg total) by mouth 2 (two) times daily.  . metFORMIN (GLUCOPHAGE) 500 MG tablet Take 500 mg by mouth 2 (two) times daily with a meal.  . metoprolol tartrate (LOPRESSOR) 25 MG tablet Take 1 tablet (25 mg total) by mouth 2 (two) times daily.  . Multiple Vitamin (MULTIVITAMIN) capsule Take 1 capsule by mouth daily.  . Omega-3 Fatty Acids (FISH OIL PO) Take 2 capsules by mouth daily.  Marland Kitchen omeprazole (PRILOSEC) 20 MG capsule Take 20 mg by mouth daily.    . simvastatin (ZOCOR) 20 MG tablet Take 1 tablet (20 mg total) by mouth at bedtime.    Allergies:   Penicillins and Sulfonamide derivatives   Social History:  The patient  reports that she has quit smoking. She has never used smokeless tobacco. She reports that she does not drink alcohol or use drugs.   Family History:  The patient's family history includes Heart failure in her father; Kidney disease in her mother and another family member.  ROS:   Review of Systems  Constitutional: Negative for chills, diaphoresis, fever, malaise/fatigue and weight loss.  HENT: Negative for congestion.   Eyes: Negative for discharge and redness.  Respiratory: Negative for cough, hemoptysis, sputum  production, shortness of breath and wheezing.   Cardiovascular: Negative for chest pain, palpitations, orthopnea, claudication, leg swelling and PND.  Gastrointestinal: Negative for abdominal pain, blood in stool, heartburn, melena, nausea and vomiting.  Genitourinary: Negative for hematuria.  Musculoskeletal: Negative for falls and myalgias.  Skin: Negative for rash.  Neurological: Positive for headaches. Negative for dizziness, tingling, tremors, sensory change, speech change, focal weakness, loss of consciousness and weakness.  Endo/Heme/Allergies: Does not bruise/bleed easily.  Psychiatric/Behavioral: Negative for substance abuse. The patient is not nervous/anxious.   All other systems reviewed and are negative.    PHYSICAL EXAM:  VS:  BP (!) 160/82 (BP Location: Left Arm, Patient Position: Sitting, Cuff Size: Normal)   Pulse 62   Ht 5\' 1"  (1.549 m)   Wt 223 lb 4 oz (101.3 kg)   LMP 05/21/2015 Comment: irregular since age 26  BMI 42.18 kg/m  BMI: Body mass index is 42.18 kg/m.  Physical Exam  Constitutional: She is oriented to person, place, and time. She appears well-developed and well-nourished.  HENT:  Head: Normocephalic and atraumatic.  Eyes: Right eye exhibits no discharge. Left eye exhibits no discharge.  Neck: Normal range of motion. No JVD present.  Cardiovascular: Regular rhythm, S1 normal, S2 normal and normal heart sounds. Bradycardia present. Exam reveals no distant heart sounds, no friction rub, no midsystolic click and no opening snap.  No murmur heard. Pulses:      Posterior tibial pulses are 2+ on the right side and 2+ on the left side.  Pulmonary/Chest: Effort normal and breath sounds normal. No respiratory distress. She has no decreased breath sounds. She has no wheezes. She has no rales. She exhibits no tenderness.  Abdominal: Soft. She exhibits no distension. There is no abdominal tenderness.  Musculoskeletal:        General: No edema.  Neurological: She  is alert and oriented to person, place, and time.  Skin: Skin is warm and dry. No cyanosis. Nails show no clubbing.  Psychiatric: She has a normal mood and affect. Her speech is normal and behavior is normal. Judgment and thought content normal.     EKG:  Was ordered and interpreted by me today. Shows NSR, 62 bpm, normal axis, no acute ST-T changes  Recent Labs: 10/25/2018: BUN 15; Creatinine, Ser 0.77; Hemoglobin 13.7; Platelets 228; Potassium 4.6; Sodium 144; TSH 1.930  No results found for requested labs within last 8760 hours.   Estimated Creatinine Clearance: 79.7 mL/min (by C-G formula based on SCr of 0.77 mg/dL).   Wt Readings from Last 3 Encounters:  11/02/18 223 lb 4 oz (101.3 kg)  10/18/18 222 lb (100.7 kg)  05/07/18 227 lb 12.8 oz (103.3 kg)     Other studies reviewed: Additional studies/records reviewed today include: summarized above  ASSESSMENT AND PLAN:  1. Hypertension: Blood pressure remains suboptimally controlled.  Add hydralazine 25 mg 3 times daily.  Otherwise, she will continue Cardizem CD, Lasix, lisinopril, and Lopressor.  We may ultimately need outpatient sleep study as outlined below given presumed sleep disordered breathing and obesity.  Weight loss is recommended.  Recommend she continue low-sodium diet.  Recent labs on 10/25/2018 unrevealing.  She has been advised to continue to take blood pressure approximately 2 hours after taking p.o. medications and call us with those readings in 2 weeks for further adjustment of antihypertensive therapy.  2. Atrial tachycardia/PVCs: Asymptomatic.  Continue Cardizem CD, Lopressor, and flecainide per EP.  3. Bradycardia: Asymptomatic.  Patient indicates she prefers her heart rate to be in the low to mid 50s bpm and does not want to make any medication adjustments with rate limiting medications.  4. Obesity with possible sleep disordered breathing: Weight loss is advised.  She prefers to adjust antihypertensive therapy as  outlined above prior to proceeding with sleep study.  If BP continues to be elevated despite escalation in antihypertensive therapy would again recommend pursuing sleep study through pulmonology.  Disposition: F/u with Dr. Kirke Corin or an APP in 4 weeks and EP as directed.  Current medicines are reviewed at length with the patient today.  The patient did not have any concerns regarding medicines.  Signed, Eula Listen, PA-C 11/02/2018 10:51 AM     [facility] -  [address] [place], [place] [address] [phone]

## 2018-11-02 ENCOUNTER — Encounter: Payer: Self-pay | Admitting: Physician Assistant

## 2018-11-02 ENCOUNTER — Ambulatory Visit (INDEPENDENT_AMBULATORY_CARE_PROVIDER_SITE_OTHER): Admitting: Physician Assistant

## 2018-11-02 VITALS — BP 160/82 | HR 62 | Ht 61.0 in | Wt 223.2 lb

## 2018-11-02 DIAGNOSIS — I10 Essential (primary) hypertension: Secondary | ICD-10-CM

## 2018-11-02 DIAGNOSIS — I471 Supraventricular tachycardia: Secondary | ICD-10-CM

## 2018-11-02 DIAGNOSIS — R001 Bradycardia, unspecified: Secondary | ICD-10-CM

## 2018-11-02 DIAGNOSIS — G4730 Sleep apnea, unspecified: Secondary | ICD-10-CM

## 2018-11-02 DIAGNOSIS — Z6841 Body Mass Index (BMI) 40.0 and over, adult: Secondary | ICD-10-CM

## 2018-11-02 MED ORDER — HYDRALAZINE HCL 25 MG PO TABS: 25.0000 mg | ORAL_TABLET | Freq: Three times a day (TID) | ORAL | 3 refills | Status: DC

## 2018-11-02 NOTE — Patient Instructions (Signed)
Medication Instructions:  Your physician has recommended you make the following change in your medication:  1- START Hydralazine Take 1 tablet (25 mg total) by mouth 3 (three) times daily  If you need a refill on your cardiac medications before your next appointment, please call your pharmacy.   Lab work: None ordered   If you have labs (blood work) drawn today and your tests are completely normal, you will receive your results only by: Marland Kitchen MyChart Message (if you have MyChart) OR . A paper copy in the mail If you have any lab test that is abnormal or we need to change your treatment, we will call you to review the results.  Testing/Procedures: None ordered   Follow-Up: At [facility], you and your health needs are our priority.  As part of our continuing mission to provide you with exceptional heart care, we have created designated Provider Care Teams.  These Care Teams include your primary Cardiologist (physician) and Advanced Practice Providers (APPs -  Physician Assistants and Nurse Practitioners) who all work together to provide you with the care you need, when you need it. You will need a follow up appointment in 4 weeks.  You may see Lorine Bears, MD or Eula Listen, PA-C.  Any Other Special Instructions Will Be Listed Below (If Applicable). Please call the office in 2 weeks with BP readings.

## 2018-11-15 ENCOUNTER — Other Ambulatory Visit: Payer: Self-pay

## 2018-11-15 ENCOUNTER — Telehealth: Payer: Self-pay

## 2018-11-15 MED ORDER — HYDRALAZINE HCL 50 MG PO TABS: 50.0000 mg | ORAL_TABLET | Freq: Three times a day (TID) | ORAL | 3 refills | Status: DC

## 2018-11-15 NOTE — Telephone Encounter (Signed)
Call to patient to see if she has some updated BP values since last visit. She had c/o HTN at that time.   LMTCB

## 2018-11-15 NOTE — Progress Notes (Signed)
See mychart messages r/t encounter.

## 2018-11-15 NOTE — Telephone Encounter (Signed)
Phone note from separate encounter. Requested BP values from patient. See attached.   Information forwarded to provider for further instruction.   No new orders at this time.

## 2018-11-20 NOTE — Progress Notes (Deleted)
Cardiology Office Note Date:  11/20/2018  Patient ID:  Lori Phelps 1956-05-06, MRN [id number] PCP:  Abigail Miyamoto, MD  Cardiologist:  Dr. Kirke Corin, MD Electrophysiologist: Dr. Johney Frame, MD  ***refresh   Chief Complaint: Follow up  History of Present Illness: Lori Phelps is a 63 y.o. female with history of nonsustained atrial tachycardia, possible brief nonsustained Afib, PACs, DM2, HTN, HLD, and hypothyroidism who presents for follow up of elevated blood pressures.   She is followed by Dr. Johney Frame for her arrhythmia with Holter in 2017 being unable to exclude Afib. Her symptoms have been controlled by flecainide, diltiazem, and metoprolol. Most recent echo from 03/2016 showed an EF of 60-65%, mild LVH, no RWMA, Gr1DD.   She contacted our office on 10/15/2018 noting elevated BP over the prior week with readings into the 190s-200/90s-100s with heart rates in the 50s-60s bpm. She denied any changes and compliance with medications. She did note recent diagnosis of bronchitis which was being managed by her PCP. She reported in a separate communication to our office on 2/7 her BP was 210/97 at her PCP's office on 2/3 and was given a "yellow pill," which improved her BP to 145/86. She was also started on lisinopril 20 mg daily at that time. She was evaluated on 10/18/2018 noting a several week to several month history of elevated BP readings in the setting of increased stress at home.  BP was noted to be 148/80 with a heart rate of 58 bpm.  She reported her blood pressure would frequently be in the 180s systolic in the morning prior to taking her medications.  She was noted to have recently put on approximately 20 pounds.  Her lisinopril was increased to 20 mg twice daily.  She was otherwise continued on Cardizem CD, Lasix, and Lopressor.  Weight loss and low-sodium diet was recommended.  Patient sent [place] several my chart messages noting systolics ranging from the 130s to 170s.  In this setting,  she was referred to pulmonology for evaluation of possible sleep study and hydralazine 25 mg 3 times daily was added.  She was last seen in the office on 11/02/2018, noting she did not yet start hydralazine with BP ranging from the 130s to 160s systolic and heart rates in the 50s to 60s bpm. She was started on hydralazine 25 mg tid with subsequent titration to 50 mg tid.   Labs: 10/25/2018 - serum creatinine 0.77, potassium 4.6, TSH normal, CBC unremarkable  **  Past Medical History:  Diagnosis Date   Atrial tachycardia (HCC)    Diabetes mellitus without complication (HCC)    HLD (hyperlipidemia)    HTN (hypertension)    Hx of bronchitis    uses MDI prn and on advair daily but no dx of asthma    Hypothyroidism    Overweight    Palpitations    hx PAC"S    Past Surgical History:  Procedure Laterality Date   BACK SURGERY  1994   COLONOSCOPY  2002   DILATATION & CURETTAGE/HYSTEROSCOPY WITH MYOSURE N/A 08/22/2015   Procedure: DILATATION & CURETTAGE/HYSTEROSCOPY WITH MYOSURE AND POLYPECTOMY;  Surgeon: Huel Cote, MD;  Location: [facility];  Service: Gynecology;  Laterality: N/A;   NASAL SINUS SURGERY     [id number]    No outpatient medications have been marked as taking for the 12/01/18 encounter (Appointment) with Sondra Barges, PA-C.    Allergies:   Penicillins and Sulfonamide derivatives   Social History:  The patient  reports  that she has quit smoking. She has never used smokeless tobacco. She reports that she does not drink alcohol or use drugs.   Family History:  The patient's family history includes Heart failure in her father; Kidney disease in her mother and another family member.  ROS:   ROS   PHYSICAL EXAM: *** VS:  LMP 05/21/2015 Comment: irregular since age 54  BMI: There is no height or weight on file to calculate BMI.  Physical Exam   EKG:  Was ordered and interpreted by me today. Shows ***  Recent Labs: 10/25/2018: BUN 15; Creatinine, Ser 0.77;  Hemoglobin 13.7; Platelets 228; Potassium 4.6; Sodium 144; TSH 1.930  No results found for requested labs within last 8760 hours.   CrCl cannot be calculated (Patient's most recent lab result is older than the maximum 21 days allowed.).   Wt Readings from Last 3 Encounters:  11/02/18 223 lb 4 oz (101.3 kg)  10/18/18 222 lb (100.7 kg)  05/07/18 227 lb 12.8 oz (103.3 kg)     Other studies reviewed: Additional studies/records reviewed today include: summarized above  ASSESSMENT AND PLAN:  1. ***  Disposition: F/u with Dr. Kirke Corin or an APP in *** and EP as directed.   Current medicines are reviewed at length with the patient today.  The patient did not have any concerns regarding medicines.  Signed, Eula Listen, PA-C 11/20/2018 2:43 PM     [facility] [place] [address] [place], [place] [address] [phone]

## 2018-11-26 ENCOUNTER — Telehealth: Payer: Self-pay

## 2018-11-26 NOTE — Telephone Encounter (Signed)

## 2018-12-01 ENCOUNTER — Ambulatory Visit: Admitting: Physician Assistant

## 2018-12-01 ENCOUNTER — Telehealth: Payer: Self-pay | Admitting: Physician Assistant

## 2018-12-01 NOTE — Telephone Encounter (Signed)
Jefferey Pica, RN       12/01/18 9:51 AM  Note    Per Alycia Rossetti, the patient was scheduled to see him today originally. He has gone in the COVID-19 cancel pool and feels the patient would be candidate for a e-visit (telephone) today, tomorrow, or Friday.  I spoke with the patient. She feels she is fairly stable right now. She is not home and would not be able to do an e-visit this morning otherwise she would be agreeable.  I advised the patient that since she uses her MyChart, I will go ahead and send a consent through to her to review. She is aware after reviewing the consent, to MyChart [place] back and advise is she agrees to the terms of the consent and if so, which day/ time would work best for an e-visit for her.  The patient voices understanding and is agreeable.   Will await response from MyChart message.

## 2018-12-01 NOTE — Telephone Encounter (Signed)
Patient will call back to schedule if wanting an Evisit.

## 2018-12-01 NOTE — Telephone Encounter (Signed)
Per Alycia Rossetti, the patient was scheduled to see him today originally. He has gone in the COVID-19 cancel pool and feels the patient would be candidate for a e-visit (telephone) today, tomorrow, or Friday.  I spoke with the patient. She feels she is fairly stable right now. She is not home and would not be able to do an e-visit this morning otherwise she would be agreeable.  I advised the patient that since she uses her MyChart, I will go ahead and send a consent through to her to review. She is aware after reviewing the consent, to MyChart [place] back and advise is she agrees to the terms of the consent and if so, which day/ time would work best for an e-visit for her.  The patient voices understanding and is agreeable.   Will await response from MyChart message.

## 2018-12-06 NOTE — Telephone Encounter (Signed)
Patient has reviewed consent and would like to wait to schedule an in office visit at a later time Will call if any symptoms or issues arise for evisit

## 2018-12-06 NOTE — Telephone Encounter (Signed)
To scheduling pool to try to reach out to the patient to arrange a virtual phone visit with Alycia Rossetti, [place] if she agreeable.  Consent forwarded to MyChart but the message has not been read yet.

## 2018-12-07 NOTE — Telephone Encounter (Signed)
Patient states she has received multiple phone calls about this appt.  She has received the consent via mychart and will call back when she is ready to schedule an evisit.  She Declines to schedule at this time.  °

## 2018-12-07 NOTE — Telephone Encounter (Signed)
Patient states she has received multiple phone calls about this appt.  She has received the consent via mychart and will call back when she is ready to schedule an evisit.  She Declines to schedule at this time.

## 2018-12-16 NOTE — Telephone Encounter (Signed)
RECALL PLACED FOR 04/09/2019 

## 2018-12-17 ENCOUNTER — Other Ambulatory Visit: Payer: Self-pay | Admitting: *Deleted

## 2018-12-17 MED ORDER — HYDRALAZINE HCL 50 MG PO TABS: 50.0000 mg | ORAL_TABLET | Freq: Three times a day (TID) | ORAL | 3 refills | Status: DC

## 2018-12-27 ENCOUNTER — Telehealth: Payer: Self-pay | Admitting: *Deleted

## 2018-12-27 ENCOUNTER — Other Ambulatory Visit: Payer: Self-pay

## 2018-12-27 DIAGNOSIS — R7309 Other abnormal glucose: Secondary | ICD-10-CM

## 2018-12-27 DIAGNOSIS — I5189 Other ill-defined heart diseases: Secondary | ICD-10-CM

## 2018-12-27 DIAGNOSIS — M7989 Other specified soft tissue disorders: Secondary | ICD-10-CM

## 2018-12-27 NOTE — Telephone Encounter (Signed)
Orders have been placed and message sent to the patient via MyChart. Labs will have to be done at [facility]. Message has been sent to scheduling to set up the ECHO.

## 2018-12-27 NOTE — Progress Notes (Signed)
  Message patient. See charting.

## 2018-12-27 NOTE — Telephone Encounter (Signed)
-----   Message from Sondra Barges, PA-C sent at 12/27/2018 12:16 PM EDT ----- Can you please get this patient scheduled for an echo, CMP, CBC, and BNP. Preferably this week. Diagnosis: lower extremity swelling and diastolic dysfunction. Thanks!

## 2018-12-29 ENCOUNTER — Encounter: Payer: Self-pay | Admitting: Cardiovascular Disease

## 2018-12-29 ENCOUNTER — Other Ambulatory Visit

## 2018-12-30 ENCOUNTER — Telehealth: Payer: Self-pay | Admitting: Cardiovascular Disease

## 2018-12-30 ENCOUNTER — Encounter: Payer: Self-pay | Admitting: Internal Medicine

## 2018-12-30 NOTE — Telephone Encounter (Signed)
Made in error

## 2019-01-26 NOTE — Telephone Encounter (Signed)
Patient has consented to an Evisit and will call us if it is needed. Otherwise I will place a recall for Aug when we will be able to see patients in office.

## 2019-04-01 ENCOUNTER — Other Ambulatory Visit: Payer: Self-pay | Admitting: *Deleted

## 2019-04-01 MED ORDER — METOPROLOL TARTRATE 25 MG PO TABS: 25.0000 mg | ORAL_TABLET | Freq: Two times a day (BID) | ORAL | 2 refills | Status: DC

## 2019-04-01 MED ORDER — LISINOPRIL 20 MG PO TABS: 20.0000 mg | ORAL_TABLET | Freq: Two times a day (BID) | ORAL | 2 refills | Status: DC

## 2019-04-01 MED ORDER — FUROSEMIDE 20 MG PO TABS: 20.0000 mg | ORAL_TABLET | Freq: Every day | ORAL | 2 refills | Status: DC

## 2019-04-01 MED ORDER — FLECAINIDE ACETATE 50 MG PO TABS: 50.0000 mg | ORAL_TABLET | Freq: Two times a day (BID) | ORAL | 2 refills | Status: DC

## 2019-04-01 NOTE — Progress Notes (Signed)
Called and spoke with patient to clarify where to send prescriptions as two places were listed. Discussed importance of follow up appointment and that we do have some virtual appointments if she feels strongly about not coming into office. She said that would work and let her know that I would have someone from scheduling give her a call to get something set up. She was very appreciative for the call and agreeable with plan. She verbalized understanding of our conversation, agreement with plan, and had no further questions at this time.

## 2019-05-19 ENCOUNTER — Other Ambulatory Visit

## 2019-05-20 ENCOUNTER — Other Ambulatory Visit

## 2019-05-26 ENCOUNTER — Other Ambulatory Visit

## 2019-06-10 ENCOUNTER — Telehealth (INDEPENDENT_AMBULATORY_CARE_PROVIDER_SITE_OTHER): Admitting: Cardiovascular Disease

## 2019-06-10 ENCOUNTER — Encounter: Payer: Self-pay | Admitting: Cardiovascular Disease

## 2019-06-10 VITALS — BP 134/72 | HR 62 | Ht 61.0 in | Wt 208.0 lb

## 2019-06-10 DIAGNOSIS — I471 Supraventricular tachycardia: Secondary | ICD-10-CM | POA: Diagnosis not present

## 2019-06-10 DIAGNOSIS — I10 Essential (primary) hypertension: Secondary | ICD-10-CM

## 2019-06-10 DIAGNOSIS — I4719 Other supraventricular tachycardia: Secondary | ICD-10-CM

## 2019-06-10 DIAGNOSIS — E785 Hyperlipidemia, unspecified: Secondary | ICD-10-CM

## 2019-06-10 MED ORDER — METOPROLOL TARTRATE 25 MG PO TABS: 25.0000 mg | ORAL_TABLET | Freq: Two times a day (BID) | ORAL | 3 refills | Status: DC

## 2019-06-10 MED ORDER — FUROSEMIDE 20 MG PO TABS: 20.0000 mg | ORAL_TABLET | Freq: Every day | ORAL | 3 refills | Status: DC

## 2019-06-10 MED ORDER — DILTIAZEM HCL ER COATED BEADS 240 MG PO CP24: 240.0000 mg | ORAL_CAPSULE | Freq: Every day | ORAL | 3 refills | Status: DC

## 2019-06-10 MED ORDER — HYDRALAZINE HCL 50 MG PO TABS: 50.0000 mg | ORAL_TABLET | Freq: Three times a day (TID) | ORAL | 3 refills | Status: DC

## 2019-06-10 MED ORDER — FLECAINIDE ACETATE 50 MG PO TABS: 50.0000 mg | ORAL_TABLET | Freq: Two times a day (BID) | ORAL | 3 refills | Status: DC

## 2019-06-10 NOTE — Progress Notes (Signed)
Virtual Visit via Telephone Note   This visit type was conducted due to national recommendations for restrictions regarding the COVID-19 Pandemic (e.g. social distancing) in an effort to limit this patient's exposure and mitigate transmission in our community.  Due to her co-morbid illnesses, this patient is at least at moderate risk for complications without adequate follow up.  This format is felt to be most appropriate for this patient at this time.  The patient did not have access to video technology/had technical difficulties with video requiring transitioning to audio format only (telephone).  All issues noted in this document were discussed and addressed.  No physical exam could be performed with this format.  Please refer to the patient's chart for her  consent to telehealth [facility].   Date:  06/10/2019   ID:  Lori Primaatti M Cookston, DOB Jan 30, 1956, MRN [id number]  Patient Location: Home Provider Location: Office  PCP:  Abigail MiyamotoPerry, Lawrence Edward, MD  Cardiologist:  Lorine BearsMuhammad Arida, MD  Electrophysiologist:  None   Evaluation Performed:  Follow-Up Visit  Chief Complaint: Cold  History of Present Illness:    Lori Phelps is a 63 y.o. female was reached via phone for follow-up visit regarding palpitations due to nonsustained atrial tachycardia and brief nonsustained atrial fibrillation.  She has type 2 diabetes, essential hypertension and hypothyroidism. She has been followed by Dr. Johney FrameAllred in the past for arrhythmia.  Symptoms have been controlled with flecainide, diltiazem and metoprolol. Echocardiogram in July 2017 showed normal LV systolic function with no significant valvular abnormalities. She was most recently seen earlier this year for uncontrolled hypertension that improved with addition of hydralazine.  She has been doing well with no recent chest pain, shortness of breath or palpitations.  She did have recent cold symptoms and [facility] and was  negative.  The patient does not have symptoms concerning for COVID-19 infection (fever, chills, cough, or new shortness of breath).    Past Medical History:  Diagnosis Date  . Atrial tachycardia (HCC)   . Diabetes mellitus without complication (HCC)   . HLD (hyperlipidemia)   . HTN (hypertension)   . Hx of bronchitis    uses MDI prn and on advair daily but no dx of asthma   . Hypothyroidism   . Overweight   . Palpitations    hx PAC"S   Past Surgical History:  Procedure Laterality Date  . BACK SURGERY  1994  . COLONOSCOPY  2002  . DILATATION & CURETTAGE/HYSTEROSCOPY WITH MYOSURE N/A 08/22/2015   Procedure: DILATATION & CURETTAGE/HYSTEROSCOPY WITH MYOSURE AND POLYPECTOMY;  Surgeon: Huel CoteKathy Richardson, MD;  Location: [facility];  Service: Gynecology;  Laterality: N/A;  . NASAL SINUS SURGERY     [id number]     Current Meds  Medication Sig  . albuterol (PROVENTIL HFA;VENTOLIN HFA) 108 (90 BASE) MCG/ACT inhaler Inhale 2 puffs into the lungs every 6 (six) hours as needed for wheezing or shortness of breath.  . Ascorbic Acid (VITAMIN C PO) Take 1 tablet by mouth daily.  Marland Kitchen. aspirin EC 81 MG tablet Take 81 mg by mouth daily.  . Coenzyme Q10 (CO Q 10 PO) Take 1 capsule by mouth daily.  Marland Kitchen. diltiazem (CARDIZEM CD) 240 MG 24 hr capsule Take 1 capsule (240 mg total) by mouth daily.  . flecainide (TAMBOCOR) 50 MG tablet Take 1 tablet (50 mg total) by mouth 2 (two) times daily. May take an extra tablet for palpitations  . Fluticasone-Salmeterol (ADVAIR DISKUS) 250-50 MCG/DOSE AEPB Inhale  1 puff into the lungs every 12 (twelve) hours.    . furosemide (LASIX) 20 MG tablet Take 1 tablet (20 mg total) by mouth daily.  . hydrALAZINE (APRESOLINE) 50 MG tablet Take 1 tablet (50 mg total) by mouth 3 (three) times daily.  Marland Kitchen levothyroxine (SYNTHROID, LEVOTHROID) 25 MCG tablet Take 25 mcg by mouth daily.    Marland Kitchen lisinopril (ZESTRIL) 20 MG tablet Take 1 tablet (20 mg total) by mouth 2 (two) times daily.  . metFORMIN  (GLUCOPHAGE) 500 MG tablet Take 500 mg by mouth 2 (two) times daily with a meal.  . metoprolol tartrate (LOPRESSOR) 25 MG tablet Take 1 tablet (25 mg total) by mouth 2 (two) times daily.  . Multiple Vitamin (MULTIVITAMIN) capsule Take 1 capsule by mouth daily.  . Omega-3 Fatty Acids (FISH OIL PO) Take 2 capsules by mouth daily.  Marland Kitchen omeprazole (PRILOSEC) 20 MG capsule Take 20 mg by mouth daily.    . simvastatin (ZOCOR) 20 MG tablet Take 1 tablet (20 mg total) by mouth at bedtime.     Allergies:   Penicillins and Sulfonamide derivatives   Social History   Tobacco Use  . Smoking status: Former Research scientist (life sciences)  . Smokeless tobacco: Never Used  Substance Use Topics  . Alcohol use: No    Alcohol/week: 0.0 standard drinks  . Drug use: No     Family Hx: The patient's family history includes Heart failure in her father; Kidney disease in her mother and another family member. There is no history of Colon cancer, Colon polyps, Esophageal cancer, Rectal cancer, or Stomach cancer.  ROS:   Please see the history of present illness.     All other systems reviewed and are negative.   Prior CV studies:   The following studies were reviewed today:    Labs/Other Tests and Data Reviewed:    EKG:  No ECG reviewed.  Recent Labs: 10/25/2018: BUN 15; Creatinine, Ser 0.77; Hemoglobin 13.7; Platelets 228; Potassium 4.6; Sodium 144; TSH 1.930   Recent Lipid Panel No results found for: CHOL, TRIG, HDL, CHOLHDL, LDLCALC, LDLDIRECT  Wt Readings from Last 3 Encounters:  06/10/19 208 lb (94.3 kg)  11/02/18 223 lb 4 oz (101.3 kg)  10/18/18 222 lb (100.7 kg)     Objective:    Vital Signs:  BP 134/72   Pulse 62   Ht 5\' 1"  (1.549 m)   Wt 208 lb (94.3 kg)   LMP 05/21/2015 Comment: irregular since age 71   BMI 39.30 kg/m    VITAL SIGNS:  reviewed  ASSESSMENT & PLAN:    1.  Palpitations due to PACs, nonsustained atrial tachycardia and nonsustained atrial fibrillation:  Symptoms are well controlled on  current medications.  I made no changes.  2. Essential hypertension: Blood pressure is well controlled now after the addition of hydralazine.  3. Diabetes mellitus:  managed by primary care physician.  4.  Hyperlipidemia: Currently on simvastatin.  COVID-19 Education: The signs and symptoms of COVID-19 were discussed with the patient and how to seek care for testing (follow up with PCP or arrange E-visit).  The importance of social distancing was discussed today.  Time:   Today, I have spent 6 minutes with the patient with telehealth technology discussing the above problems.     Medication Adjustments/Labs and Tests Ordered: Current medicines are reviewed at length with the patient today.  Concerns regarding medicines are outlined above.   Tests Ordered: No orders of the defined types were placed in this encounter.  Medication Changes: No orders of the defined types were placed in this encounter.   Follow Up:  In Person in 6 month(s)  Signed, Lorine Bears, MD  06/10/2019 8:14 AM    [facility]

## 2019-06-10 NOTE — Patient Instructions (Signed)
Medication Instructions:  Continue same medications If you need a refill on your cardiac medications before your next appointment, please call your pharmacy.   Lab work: None If you have labs (blood work) drawn today and your tests are completely normal, you will receive your results only by: . MyChart Message (if you have MyChart) OR . A paper copy in the mail If you have any lab test that is abnormal or we need to change your treatment, we will call you to review the results.  Testing/Procedures: None  Follow-Up: At [facility], you and your health needs are our priority.  As part of our continuing mission to provide you with exceptional heart care, we have created designated Provider Care Teams.  These Care Teams include your primary Cardiologist (physician) and Advanced Practice Providers (APPs -  Physician Assistants and Nurse Practitioners) who all work together to provide you with the care you need, when you need it. You will need a follow up appointment in 6 months.  Please call our office 2 months in advance to schedule this appointment.  You may see Muhammad Arida, MD or one of the following Advanced Practice Providers on your designated Care Team:   Christopher Berge, NP Ryan Dunn, PA-C . Jacquelyn Visser, PA-C  Any Other Special Instructions Will Be Listed Below (If Applicable).    

## 2019-08-22 ENCOUNTER — Other Ambulatory Visit: Payer: Self-pay | Admitting: Obstetrics and Gynecology

## 2019-08-22 DIAGNOSIS — Z1231 Encounter for screening mammogram for malignant neoplasm of breast: Secondary | ICD-10-CM

## 2019-08-30 ENCOUNTER — Encounter: Payer: Self-pay | Admitting: Sports Medicine

## 2019-08-30 ENCOUNTER — Other Ambulatory Visit: Payer: Self-pay | Admitting: Sports Medicine

## 2019-08-30 ENCOUNTER — Ambulatory Visit (INDEPENDENT_AMBULATORY_CARE_PROVIDER_SITE_OTHER): Admitting: Sports Medicine

## 2019-08-30 ENCOUNTER — Telehealth: Payer: Self-pay | Admitting: *Deleted

## 2019-08-30 ENCOUNTER — Ambulatory Visit (INDEPENDENT_AMBULATORY_CARE_PROVIDER_SITE_OTHER)

## 2019-08-30 ENCOUNTER — Other Ambulatory Visit: Payer: Self-pay

## 2019-08-30 DIAGNOSIS — M779 Enthesopathy, unspecified: Secondary | ICD-10-CM

## 2019-08-30 DIAGNOSIS — M10171 Lead-induced gout, right ankle and foot: Secondary | ICD-10-CM

## 2019-08-30 DIAGNOSIS — T560X1A Toxic effect of lead and its compounds, accidental (unintentional), initial encounter: Secondary | ICD-10-CM

## 2019-08-30 DIAGNOSIS — M10071 Idiopathic gout, right ankle and foot: Secondary | ICD-10-CM | POA: Diagnosis not present

## 2019-08-30 DIAGNOSIS — M7751 Other enthesopathy of right foot: Secondary | ICD-10-CM | POA: Diagnosis not present

## 2019-08-30 DIAGNOSIS — N390 Urinary tract infection, site not specified: Secondary | ICD-10-CM | POA: Insufficient documentation

## 2019-08-30 DIAGNOSIS — R300 Dysuria: Secondary | ICD-10-CM | POA: Insufficient documentation

## 2019-08-30 DIAGNOSIS — M79674 Pain in right toe(s): Secondary | ICD-10-CM | POA: Diagnosis not present

## 2019-08-30 MED ORDER — COLCHICINE 0.6 MG PO TABS: 0.6000 mg | ORAL_TABLET | Freq: Every day | ORAL | 0 refills | Status: DC

## 2019-08-30 MED ORDER — TRIAMCINOLONE ACETONIDE 10 MG/ML IJ SUSP
10.0000 mg | Freq: Once | INTRAMUSCULAR | Status: AC
  Administered 2019-08-30: 22:00:00 10 mg

## 2019-08-30 NOTE — Telephone Encounter (Signed)
Pt states she has been having gout symptoms in her right foot for 2 weeks and can't get in until 09/2019.

## 2019-08-30 NOTE — Patient Instructions (Signed)

## 2019-08-30 NOTE — Progress Notes (Signed)
Subjective: Lori Phelps is a 63 y.o. female patient who presents to office for evaluation of Right foot pain. Patient complains of progressive pain especially over the last 3 weeks at the right great toe. Ranks pain 6/10. Admits to warmth, redness, and swelling to the area that was getting better after taking Advil for about a week but then started to flare back up and become more painful.  Admits to family history of gout but denies any known personal history before this incident.  Patient denies any other pedal complaints.   Patient Active Problem List   Diagnosis Date Noted  . Dysuria 08/30/2019  . Urinary tract infectious disease 08/30/2019  . Complex endometrial hyperplasia 08/22/2015  . Atrial tachycardia ([place])   . HTN (hypertension)   . HLD (hyperlipidemia)     Current Outpatient Medications on File Prior to Visit  Medication Sig Dispense Refill  . albuterol (PROVENTIL HFA;VENTOLIN HFA) 108 (90 BASE) MCG/ACT inhaler Inhale 2 puffs into the lungs every 6 (six) hours as needed for wheezing or shortness of breath.    . Ascorbic Acid (VITAMIN C PO) Take 1 tablet by mouth daily.    Marland Kitchen aspirin EC 81 MG tablet Take 81 mg by mouth daily.    . Coenzyme Q10 (CO Q 10 PO) Take 1 capsule by mouth daily.    Marland Kitchen diltiazem (CARDIZEM CD) 240 MG 24 hr capsule Take 1 capsule (240 mg total) by mouth daily. 90 capsule 3  . flecainide (TAMBOCOR) 50 MG tablet Take 1 tablet (50 mg total) by mouth 2 (two) times daily. May take an extra tablet for palpitations 180 tablet 3  . Fluticasone-Salmeterol (ADVAIR DISKUS) 250-50 MCG/DOSE AEPB Inhale 1 puff into the lungs every 12 (twelve) hours.      . furosemide (LASIX) 20 MG tablet Take 1 tablet (20 mg total) by mouth daily. 90 tablet 3  . hydrALAZINE (APRESOLINE) 50 MG tablet Take 1 tablet (50 mg total) by mouth 3 (three) times daily. 270 tablet 3  . levothyroxine (SYNTHROID, LEVOTHROID) 25 MCG tablet Take 25 mcg by mouth daily.      Marland Kitchen lisinopril (ZESTRIL) 20 MG  tablet Take 1 tablet (20 mg total) by mouth 2 (two) times daily. 180 tablet 2  . metFORMIN (GLUCOPHAGE) 500 MG tablet Take 500 mg by mouth 2 (two) times daily with a meal.    . metoprolol tartrate (LOPRESSOR) 25 MG tablet Take 1 tablet (25 mg total) by mouth 2 (two) times daily. 180 tablet 3  . Multiple Vitamin (MULTIVITAMIN) capsule Take 1 capsule by mouth daily.    . Omega-3 Fatty Acids (FISH OIL PO) Take 2 capsules by mouth daily.    Marland Kitchen omeprazole (PRILOSEC) 20 MG capsule Take 20 mg by mouth daily.      . simvastatin (ZOCOR) 20 MG tablet Take 1 tablet (20 mg total) by mouth at bedtime. 90 tablet 3   No current facility-administered medications on file prior to visit.    Allergies  Allergen Reactions  . Penicillins Hives    Has patient had a PCN reaction causing immediate rash, facial/tongue/throat swelling, SOB or lightheadedness with hypotension: No Has patient had a PCN reaction causing severe rash involving mucus membranes or skin necrosis: No Has patient had a PCN reaction that required hospitalization No Has patient had a PCN reaction occurring within the last 10 years: No If all of the above answers are "NO", then may proceed with Cephalosporin use.   . Sulfonamide Derivatives Rash  Objective:  General: Alert and oriented x3 in no acute distress  Dermatology: Focal Swelling, warmth, redness present on the right great toe especially at the interphalangeal joint, no open lesions bilateral lower extremities, no webspace macerations, no ecchymosis bilateral, all nails x 10 are well manicured.  Vascular: Dorsalis Pedis and Posterior Tibial pedal pulses 1/4, Capillary Fill Time 3 seconds,(+) pedal hair growth bilateral,Temperature gradient increased over the right great toe especially extending to the interphalangeal joint.  Neurology: Michaell Cowing sensation intact via light touch bilateral.  Musculoskeletal: There is tenderness with palpation at right great toe, there is mild guarding  with range of motion at right great toe where there is pain and limiation, Strength within normal limits in all groups bilateral.   Gait: Unassisted, Antalgic gait avoiding weight on right foot  Xrays  Right Foot    Impression: Soft tissue swelling at right great toe with mild arthritis or the oblique view at first toe at interphalangeal joint, there is also arthritis noted at the ankle with significant heel spurs.  Otherwise no other acute findings.\    Assessment and Plan: Problem List Items Addressed This Visit    None    Visit Diagnoses    Pain of toe of right foot    -  Primary   Capsulitis       Relevant Medications   triamcinolone acetonide (KENALOG) 10 MG/ML injection 10 mg (Completed) (Start on 08/30/2019 10:15 PM)   Acute idiopathic gout involving toe of right foot       Relevant Medications   colchicine 0.6 MG tablet   triamcinolone acetonide (KENALOG) 10 MG/ML injection 10 mg (Completed) (Start on 08/30/2019 10:15 PM)       -Complete examination performed -Xrays reviewed -Discussed treatement options for gouty arthritis and gout education provided/ - After oral consent, injected right great toe with 1cc lidocaine and marcaine plain mixed with 0.25cc Kenalog-10 and Dexmethasone phosphate without complication; post injection care explained. -Rx Colchicine 0.6mg  -Advised patient to call if symptoms are not improved within 1 week -Patient to return as needed or sooner if condition worsens.  Asencion Islam, DPM

## 2019-08-30 NOTE — Telephone Encounter (Signed)
I told pt that I could probably get her in the [place] office today or tomorrow, since she had not been evaluate since 2018. Pt agreed and I transferred to scheduler.

## 2019-09-20 DIAGNOSIS — N182 Chronic kidney disease, stage 2 (mild): Secondary | ICD-10-CM

## 2019-09-22 ENCOUNTER — Ambulatory Visit: Admitting: Physician Assistant

## 2019-09-30 NOTE — Progress Notes (Signed)
Cardiology Office Note    Date:  10/06/2019   ID:  Shyler, Hamill 09/19/1955, MRN [id number]  PCP:  Lillard Anes, MD  Cardiologist:  Kathlyn Sacramento, MD  Electrophysiologist:  None   Chief Complaint: Follow up  History of Present Illness:   Lori Phelps is a 64 y.o. female with history of palpitations with nonsustained atrial tachycardia and brief nonsustained A. fib, DM2, HTN, HLD, and hypothyroidism who presents for follow-up of hypertension.  She is followed by Dr. Rayann Heman for her arrhythmia with Holter in 2017 being unable to exclude Afib. Her symptoms have been controlled by flecainide, diltiazem, and metoprolol. Most recent echo from 03/2016 showed an EF of 60-65%, mild LVH, no RWMA, Gr1DD. She was seen several times in 2020 with uncontrolled hypertension that subsequently improved with addition of hydralazine. She was most recently seen by her primary cardiologist virtually in 06/2019 with BP of 134/72 and heart rate 62 bpm. Her symptoms of palpitations were well controlled. No changes were made at that time. With continued slight worsening of renal function she has been referred to nephrology by her primary cardiologist.  She comes in doing well from a cardiac perspective.  No chest pain, shortness of breath,or palpitations.  Blood pressure has been well controlled.  Her weight is down 14 pounds without significant changes to her lifestyle.  Her main concern today is worsening renal function with most recent serum creatinine 0.94 and a GFR of 65, prior labs from 10/2018 showed a GFR of 83 with a creatinine of 0.77.  Of note, she has been prescribed colchicine for gout though has not been taking this.  She was advised by PCP to change lisinopril from 20 mg twice daily to once daily though has not yet done this.  She remains on Lasix 20 mg daily.  She feels like she needs this secondary to slight puffiness around her eyes, cheeks, and intermittent pedal edema, particularly if  she is in a car for 4 to 5 hours at a time.   Labs independently reviewed: 12/2018 - Hgb 12.6, PLT 220, BUN 22, serum creatinine 0.94, potassium 4.6, albumin 4.2, AST/ALT normal, A1c 5.9, BNP 45  Past Medical History:  Diagnosis Date  . Atrial tachycardia ([place])   . Diabetes mellitus without complication ([place])   . HLD (hyperlipidemia)   . HTN (hypertension)   . Hx of bronchitis    uses MDI prn and on advair daily but no dx of asthma   . Hypothyroidism   . Overweight   . Palpitations    hx PAC"S    Past Surgical History:  Procedure Laterality Date  . BACK SURGERY  1994  . COLONOSCOPY  2002  . DILATATION & CURETTAGE/HYSTEROSCOPY WITH MYOSURE N/A 08/22/2015   Procedure: DILATATION & CURETTAGE/HYSTEROSCOPY WITH MYOSURE AND POLYPECTOMY;  Surgeon: Paula Compton, MD;  Location: [facility];  Service: Gynecology;  Laterality: N/A;  . NASAL SINUS SURGERY     [id number]    Current Medications: Current Meds  Medication Sig  . albuterol (PROVENTIL HFA;VENTOLIN HFA) 108 (90 BASE) MCG/ACT inhaler Inhale 2 puffs into the lungs every 6 (six) hours as needed for wheezing or shortness of breath.  . Ascorbic Acid (VITAMIN C PO) Take 1 tablet by mouth daily.  Marland Kitchen aspirin EC 81 MG tablet Take 81 mg by mouth daily.  . clotrimazole-betamethasone (LOTRISONE) cream as needed.  . Coenzyme Q10 (CO Q 10 PO) Take 1 capsule by mouth daily.  . colchicine 0.6 MG  tablet Take 1 tablet (0.6 mg total) by mouth daily.  Marland Kitchen diltiazem (CARDIZEM CD) 240 MG 24 hr capsule Take 1 capsule (240 mg total) by mouth daily.  . flecainide (TAMBOCOR) 50 MG tablet Take 1 tablet (50 mg total) by mouth 2 (two) times daily. May take an extra tablet for palpitations  . Fluticasone-Salmeterol (ADVAIR DISKUS) 250-50 MCG/DOSE AEPB Inhale 1 puff into the lungs every 12 (twelve) hours.    . furosemide (LASIX) 20 MG tablet Take 1 tablet (20 mg total) by mouth daily.  . hydrALAZINE (APRESOLINE) 50 MG tablet Take 1 tablet (50 mg total) by mouth  3 (three) times daily.  Marland Kitchen levothyroxine (SYNTHROID, LEVOTHROID) 25 MCG tablet Take 25 mcg by mouth daily.    Marland Kitchen lisinopril (ZESTRIL) 20 MG tablet Take 1 tablet (20 mg total) by mouth 2 (two) times daily.  . metFORMIN (GLUCOPHAGE) 500 MG tablet Take 500 mg by mouth 2 (two) times daily with a meal.  . metoprolol tartrate (LOPRESSOR) 25 MG tablet Take 1 tablet (25 mg total) by mouth 2 (two) times daily.  . Multiple Vitamin (MULTIVITAMIN) capsule Take 1 capsule by mouth daily.  . nitrofurantoin, macrocrystal-monohydrate, (MACROBID) 100 MG capsule 2 (two) times daily.  . Omega-3 Fatty Acids (FISH OIL PO) Take 2 capsules by mouth daily.  Marland Kitchen omeprazole (PRILOSEC) 20 MG capsule Take 20 mg by mouth daily.    . simvastatin (ZOCOR) 20 MG tablet Take 1 tablet (20 mg total) by mouth at bedtime.    Allergies:   Penicillins and Sulfonamide derivatives   Social History   Socioeconomic History  . Marital status: Married    Spouse name: Not on file  . Number of children: 2  . Years of education: Not on file  . Highest education level: Not on file  Occupational History  . Not on file  Tobacco Use  . Smoking status: Former Games developer  . Smokeless tobacco: Never Used  Substance and Sexual Activity  . Alcohol use: No    Alcohol/week: 0.0 standard drinks  . Drug use: No  . Sexual activity: Not on file  Other Topics Concern  . Not on file  Social History Narrative   Pt lives in [place] with spouse.  Retired Engineer, water for [employer].   Social Determinants of Health   Financial Resource Strain:   . Difficulty of Paying Living Expenses: Not on file  Food Insecurity:   . Worried About Programme researcher, broadcasting/film/video in the Last Year: Not on file  . Ran Out of Food in the Last Year: Not on file  Transportation Needs:   . Lack of Transportation (Medical): Not on file  . Lack of Transportation (Non-Medical): Not on file  Physical Activity:   . Days of Exercise per Week: Not on file  . Minutes  of Exercise per Session: Not on file  Stress:   . Feeling of Stress : Not on file  Social Connections:   . Frequency of Communication with Friends and Family: Not on file  . Frequency of Social Gatherings with Friends and Family: Not on file  . Attends Religious Services: Not on file  . Active Member of Clubs or Organizations: Not on file  . Attends Banker Meetings: Not on file  . Marital Status: Not on file     Family History:  The patient's family history includes Heart failure in her father; Kidney disease in her mother and another family member. There is no history of  Colon cancer, Colon polyps, Esophageal cancer, Rectal cancer, or Stomach cancer.  ROS:   Review of Systems  Constitutional: Positive for malaise/fatigue. Negative for chills, diaphoresis, fever and weight loss.  HENT: Negative for congestion.   Eyes: Negative for discharge and redness.  Respiratory: Negative for cough, sputum production, shortness of breath and wheezing.   Cardiovascular: Positive for leg swelling. Negative for chest pain, palpitations, orthopnea, claudication and PND.  Gastrointestinal: Negative for abdominal pain, heartburn, nausea and vomiting.  Musculoskeletal: Negative for falls and myalgias.  Skin: Negative for rash.  Neurological: Negative for dizziness, tingling, tremors, sensory change, speech change, focal weakness, loss of consciousness and weakness.  Psychiatric/Behavioral: Negative for substance abuse. The patient is not nervous/anxious.   All other systems reviewed and are negative.    EKGs/Labs/Other Studies Reviewed:    Studies reviewed were summarized above. The additional studies were reviewed today:  2D echo 03/2016: - Left ventricle: The cavity size was normal. Wall thickness was   increased in a pattern of mild LVH. Systolic function was normal.   The estimated ejection fraction was in the range of 60% to 65%.   Wall motion was normal; there were no regional  wall motion   abnormalities. Doppler parameters are consistent with abnormal   left ventricular relaxation (grade 1 diastolic dysfunction). __________  48-hour Holter 03/2016: Normal sinus rhythm.  Frequest PVCs (1.1 %).  Frequent PACs with short runs of irregular narrow complex tachycardia (Atrial fibrillation can not be excluded).    EKG:  EKG is ordered today.  The EKG ordered today demonstrates NSR, 63 bpm, low voltage QRS, no acute ST-T changes  Recent Labs: 10/25/2018: BUN 15; Creatinine, Ser 0.77; Hemoglobin 13.7; Platelets 228; Potassium 4.6; Sodium 144; TSH 1.930  Recent Lipid Panel No results found for: CHOL, TRIG, HDL, CHOLHDL, VLDL, LDLCALC, LDLDIRECT  PHYSICAL EXAM:    VS:  BP 122/64 (BP Location: Left Arm, Patient Position: Sitting, Cuff Size: Large)   Pulse 63   Ht 5\' 1"  (1.549 m)   Wt 209 lb 4 oz (94.9 kg)   LMP 05/21/2015 Comment: irregular since age 46   SpO2 98%   BMI 39.54 kg/m   BMI: Body mass index is 39.54 kg/m.  Physical Exam  Wt Readings from Last 3 Encounters:  10/06/19 209 lb 4 oz (94.9 kg)  06/10/19 208 lb (94.3 kg)  11/02/18 223 lb 4 oz (101.3 kg)     ASSESSMENT & PLAN:   1. HTN: Blood pressure is well controlled today.  Continue current dose diltiazem, hydralazine, lisinopril, Lopressor, and change Lasix to every other day dosing as outlined below.  Low-sodium diet.  2. Nonsustained atrial tachycardia/nonsustained A. Fib/PVCs: Well-controlled.  Most recent potassium at goal.  Followed by EP, follow-up as directed.  3. Obesity: Weight is down 14 pounds today when compared to her last visit with me in 10/2018.    4. HLD: Remains on simvastatin.  Followed by PCP.  5. AKI: She has been referred to nephrology.  Trend BMP today.  Check renal artery ultrasound to evaluate for bilateral renal artery stenosis.  If she is found to have significant bilateral renal artery stenosis is ordered for further evaluation and discontinuation of ACE inhibitor.   Initially considered as needed Lasix, however patient was not very comfortable with this.  Recommend she change Lasix to every other day dosing.  For intermittent ankle swelling with extended car rides recommend compression stockings.  Elevate legs when able.  Disposition: F/u with Dr. 11/2018 or  an APP in 3 months.   Medication Adjustments/Labs and Tests Ordered: Current medicines are reviewed at length with the patient today.  Concerns regarding medicines are outlined above. Medication changes, Labs and Tests ordered today are summarized above and listed in the Patient Instructions accessible in Encounters.   Signed, Lori Listen, PA-C 10/06/2019 8:21 AM     [facility] [place] [address] [place], [place] [address] [phone]

## 2019-10-05 LAB — HM PAP SMEAR

## 2019-10-05 LAB — RESULTS CONSOLE HPV: CHL HPV: NEGATIVE

## 2019-10-06 ENCOUNTER — Ambulatory Visit (INDEPENDENT_AMBULATORY_CARE_PROVIDER_SITE_OTHER): Admitting: Physician Assistant

## 2019-10-06 ENCOUNTER — Encounter: Payer: Self-pay | Admitting: Physician Assistant

## 2019-10-06 ENCOUNTER — Other Ambulatory Visit: Payer: Self-pay

## 2019-10-06 VITALS — BP 122/64 | HR 63 | Ht 61.0 in | Wt 209.2 lb

## 2019-10-06 DIAGNOSIS — I471 Supraventricular tachycardia: Secondary | ICD-10-CM | POA: Diagnosis not present

## 2019-10-06 DIAGNOSIS — E782 Mixed hyperlipidemia: Secondary | ICD-10-CM | POA: Diagnosis not present

## 2019-10-06 DIAGNOSIS — N179 Acute kidney failure, unspecified: Secondary | ICD-10-CM

## 2019-10-06 DIAGNOSIS — I10 Essential (primary) hypertension: Secondary | ICD-10-CM | POA: Diagnosis not present

## 2019-10-06 DIAGNOSIS — Z6839 Body mass index (BMI) 39.0-39.9, adult: Secondary | ICD-10-CM

## 2019-10-06 MED ORDER — FUROSEMIDE 20 MG PO TABS: 20.0000 mg | ORAL_TABLET | ORAL | 3 refills | Status: DC

## 2019-10-06 NOTE — Patient Instructions (Addendum)
Medication Instructions:  Your physician has recommended you make the following change in your medication:  1. CHANGE Furosemide every other day  *If you need a refill on your cardiac medications before your next appointment, please call your pharmacy*  Lab Work: BMET today  If you have labs (blood work) drawn today and your tests are completely normal, you will receive your results only by: Marland Kitchen MyChart Message (if you have MyChart) OR . A paper copy in the mail If you have any lab test that is abnormal or we need to change your treatment, we will call you to review the results.  Testing/Procedures: Your physician has requested that you have a renal artery duplex. During this test, an ultrasound is used to evaluate blood flow to the kidneys. Allow one hour for this exam. Do not eat after midnight the day before and avoid carbonated beverages. Take your medications as you usually do.    Follow-Up: At [facility], you and your health needs are our priority.  As part of our continuing mission to provide you with exceptional heart care, we have created designated Provider Care Teams.  These Care Teams include your primary Cardiologist (physician) and Advanced Practice Providers (APPs -  Physician Assistants and Nurse Practitioners) who all work together to provide you with the care you need, when you need it.  Your next appointment:   3 month(s)  The format for your next appointment:   In Person  Provider:    You may see Lorine Bears, MD or one of the following Advanced Practice Providers on your designated Care Team:    Nicolasa Ducking, NP  Eula Listen, PA-C  Marisue Ivan, PA-C

## 2019-10-07 ENCOUNTER — Telehealth: Payer: Self-pay

## 2019-10-07 ENCOUNTER — Ambulatory Visit

## 2019-10-07 DIAGNOSIS — I10 Essential (primary) hypertension: Secondary | ICD-10-CM

## 2019-10-07 DIAGNOSIS — N182 Chronic kidney disease, stage 2 (mild): Secondary | ICD-10-CM

## 2019-10-07 LAB — BASIC METABOLIC PANEL
BUN/Creatinine Ratio: 26 (ref 12–28)
BUN: 28 mg/dL — ABNORMAL HIGH (ref 8–27)
CO2: 18 mmol/L — ABNORMAL LOW (ref 20–29)
Calcium: 10 mg/dL (ref 8.7–10.3)
Chloride: 103 mmol/L (ref 96–106)
Creatinine, Ser: 1.09 mg/dL — ABNORMAL HIGH (ref 0.57–1.00)
GFR calc Af Amer: 62 mL/min/{1.73_m2} (ref >59)
GFR calc non Af Amer: 54 mL/min/{1.73_m2} — ABNORMAL LOW (ref >59)
Glucose: 95 mg/dL (ref 65–99)
Potassium: 5.1 mmol/L (ref 3.5–5.2)
Sodium: 138 mmol/L (ref 134–144)

## 2019-10-07 NOTE — Telephone Encounter (Signed)
-----   Message from Sondra Barges, PA-C sent at 10/07/2019  7:14 AM EST ----- Renal function remains elevated and slightly worse. Potassium is high normal as well. With an elevated BUN and SCr, this indicates she is dehydrated, which is likely leading to the worsening renal function. This is consistent with my thoughts of her renal function at her visit. Recommend she hold Lasix for 2 days then resume at every other day dosing. Make sure she is not added salt substitutes or seasonings that are high in potassium Recommend recheck BMET in 1 week.

## 2019-10-07 NOTE — Telephone Encounter (Signed)
Call to patient to discuss lab results and POC.   Pt verbalized understanding and is agreement with POC.   She agrees to take lasix every other day after a 2 day hold.   Pt reports that labs were taken in [facility] 09/19/19. She will fax results to our office, but says they are improving.   Lab redraw needed in 1 week.   Advised pt to call for any further questions or concerns.

## 2019-10-14 ENCOUNTER — Other Ambulatory Visit: Payer: Self-pay | Admitting: Physician Assistant

## 2019-10-14 DIAGNOSIS — N179 Acute kidney failure, unspecified: Secondary | ICD-10-CM

## 2019-10-14 DIAGNOSIS — I10 Essential (primary) hypertension: Secondary | ICD-10-CM

## 2019-10-26 ENCOUNTER — Ambulatory Visit (INDEPENDENT_AMBULATORY_CARE_PROVIDER_SITE_OTHER)

## 2019-10-26 ENCOUNTER — Other Ambulatory Visit: Payer: Self-pay

## 2019-10-26 DIAGNOSIS — N179 Acute kidney failure, unspecified: Secondary | ICD-10-CM

## 2019-10-26 DIAGNOSIS — I10 Essential (primary) hypertension: Secondary | ICD-10-CM | POA: Diagnosis not present

## 2019-10-26 NOTE — Telephone Encounter (Signed)
Call to patient to discuss lab results. She reported her preference for treatment would be in [place] with Dr. Cristela Felt.   Advised pt to call for any further questions or concerns. Order placed for referral.

## 2019-10-26 NOTE — Telephone Encounter (Signed)
-----   Message from Sondra Barges, PA-C sent at 10/26/2019  9:39 AM EST ----- No evidence of renal artery stenosis bilaterally with normal flow in the celiac and SMA.  Reassuring study.  Follow-up with nephrology for underlying renal dysfunction.

## 2019-11-02 ENCOUNTER — Other Ambulatory Visit: Payer: Self-pay

## 2019-11-02 MED ORDER — LEVOTHYROXINE SODIUM 25 MCG PO TABS: 25.0000 ug | ORAL_TABLET | Freq: Every day | ORAL | 1 refills | Status: DC

## 2019-12-01 IMAGING — MG DIGITAL SCREENING BILATERAL MAMMOGRAM WITH TOMO AND CAD
8 series · 8 of 24 positions shown · non-contrast
Comparison: Previous exam(s).

CLINICAL DATA: Screening.

EXAM:
DIGITAL SCREENING BILATERAL MAMMOGRAM WITH TOMO AND CAD

[L MLO synth-2D]
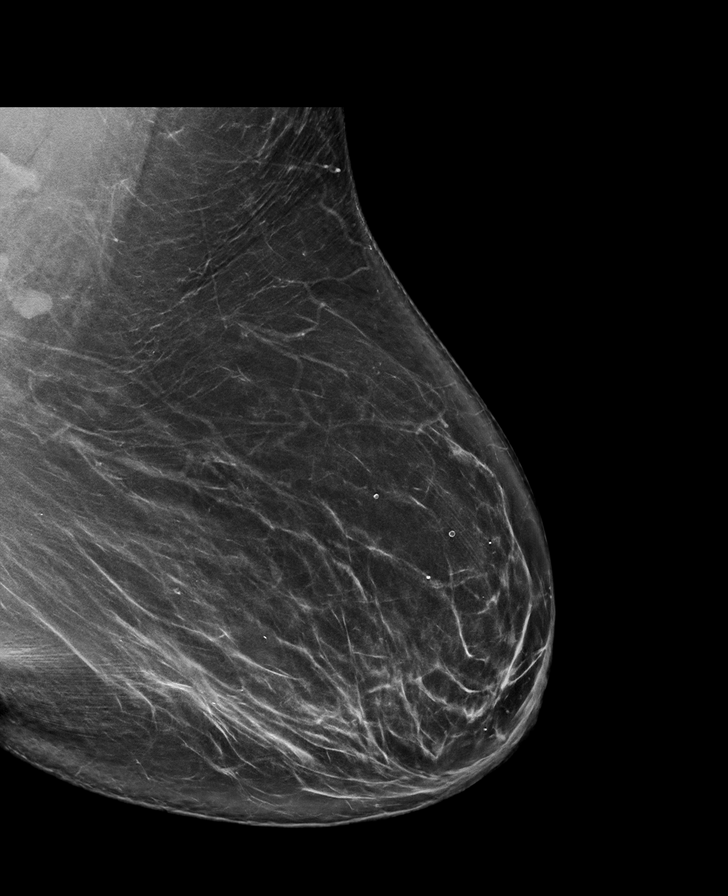

[R CC synth-2D]
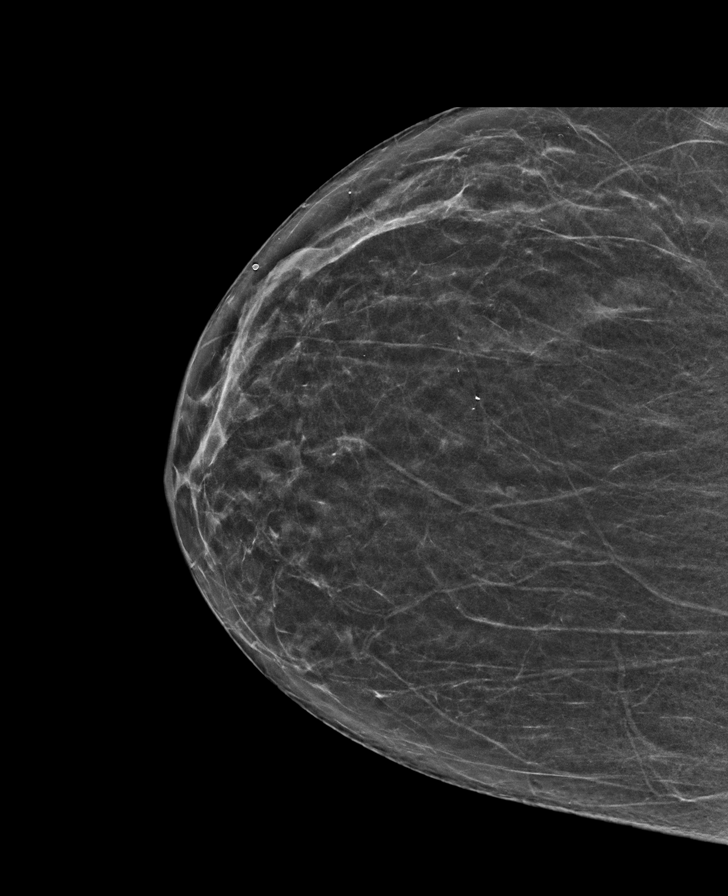

[R MLO synth-2D]
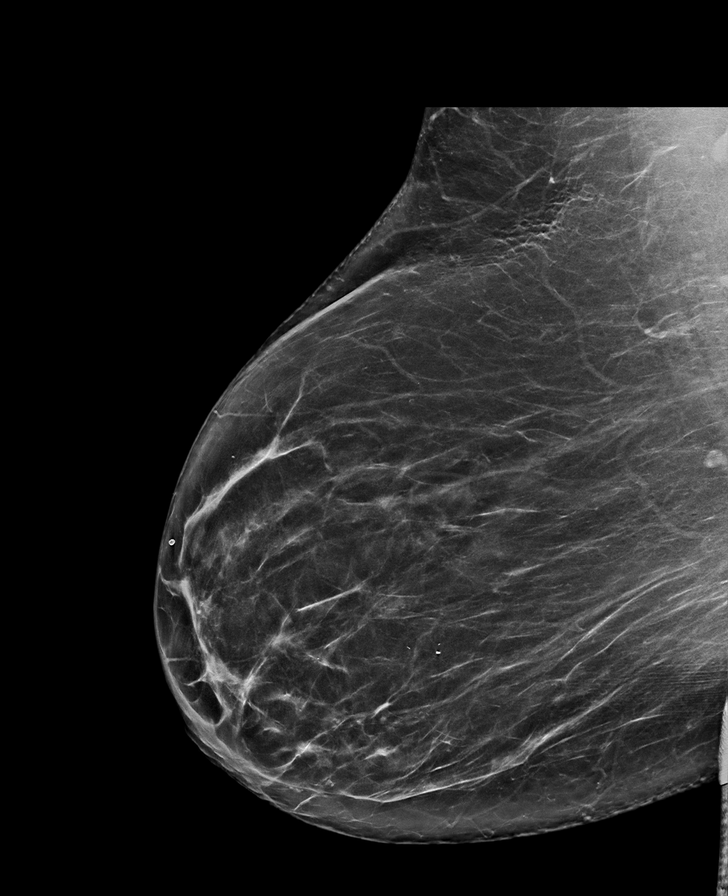

[L CC synth-2D]
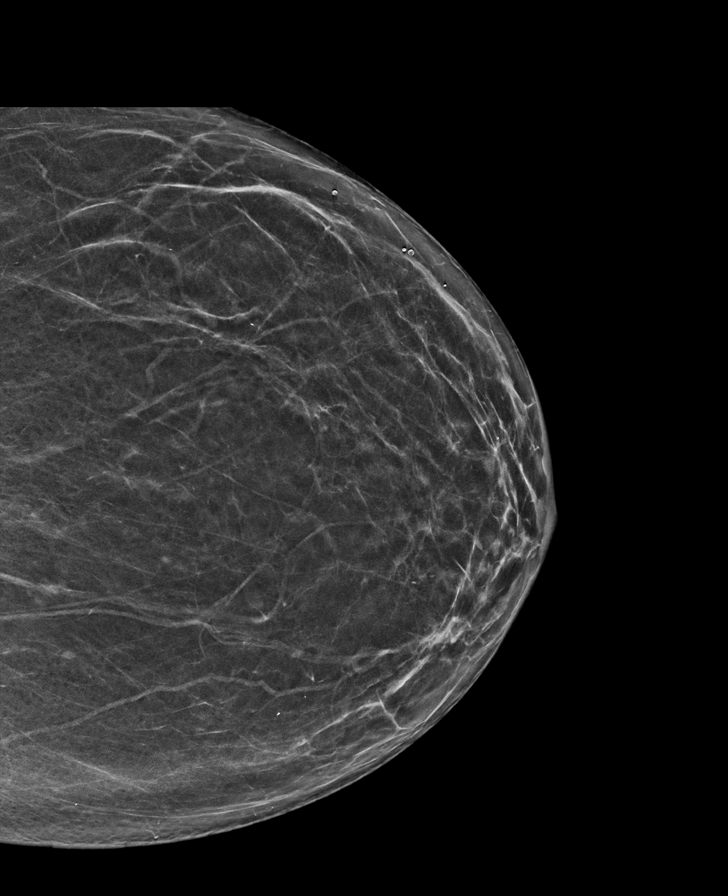

[L CC tomo · tomo slice 33/64.0]
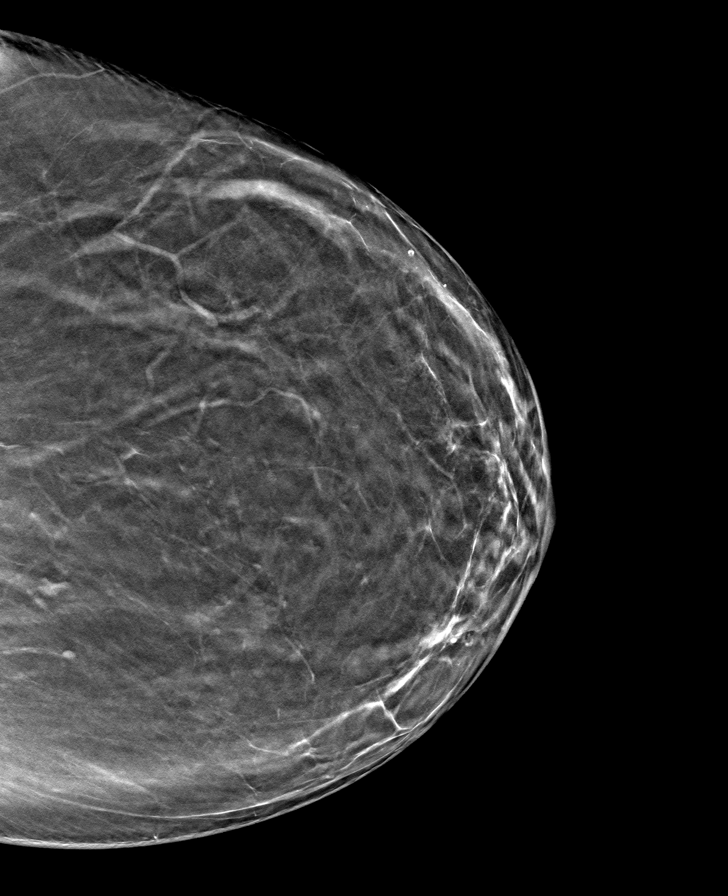

[R MLO tomo · tomo slice 41/81.0]
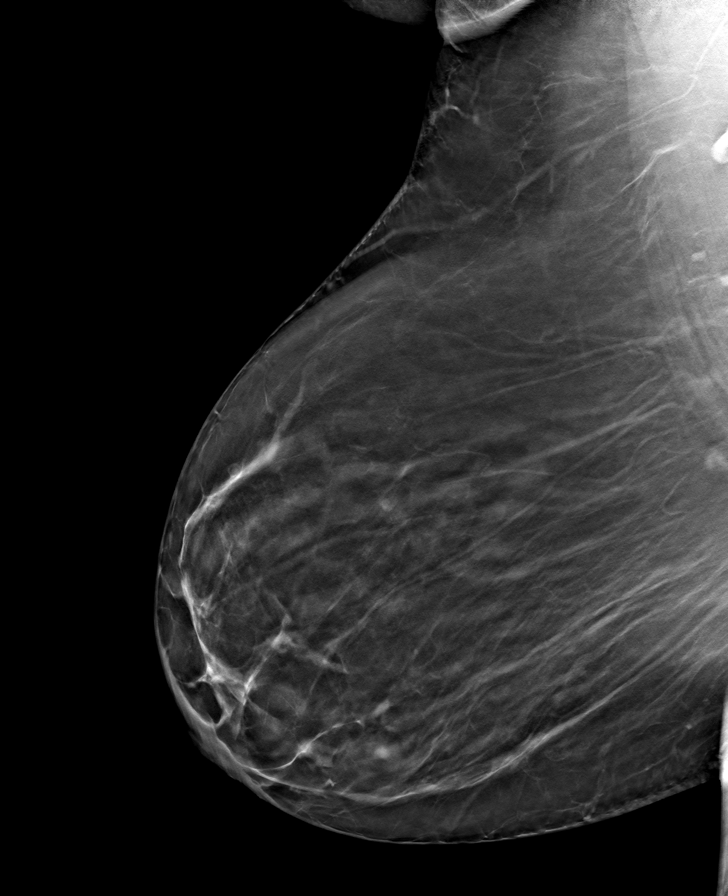

[L MLO tomo · tomo slice 44/87.0]
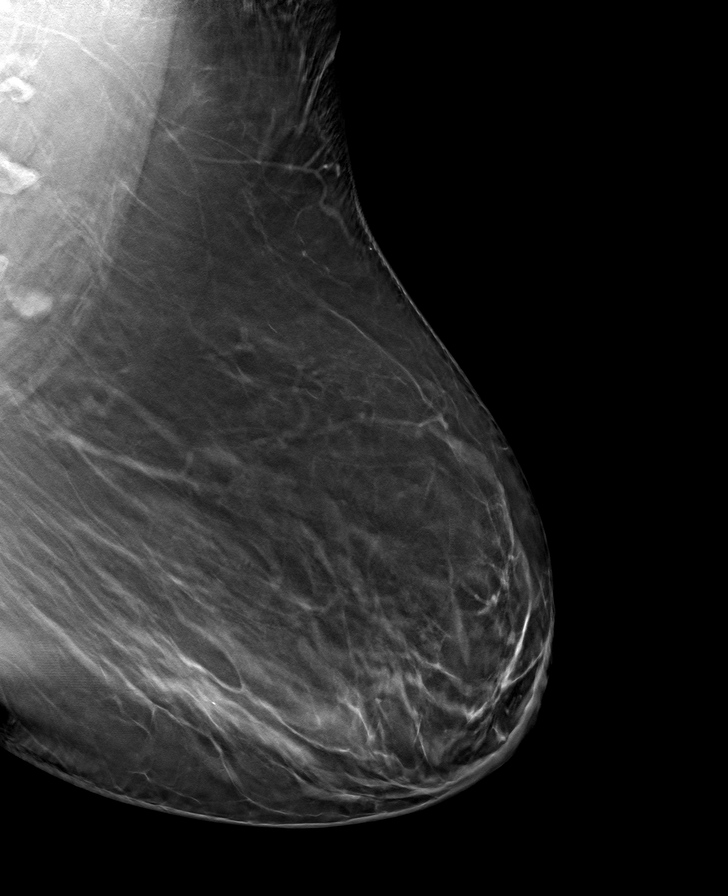

[R CC tomo · tomo slice 31/61.0]
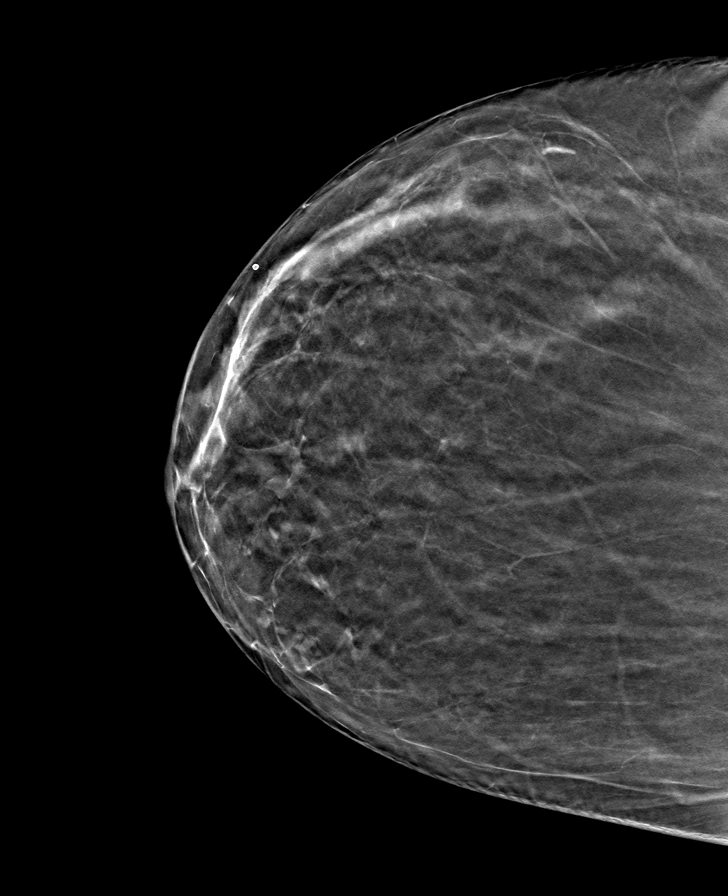

[8 of 24 positions shown; findings below may reference images not displayed]

ACR Breast Density Category b: There are scattered areas of
fibroglandular density.
FINDINGS: There are no findings suspicious for malignancy. Images were
processed with CAD.
IMPRESSION: No mammographic evidence of malignancy. A result letter of this
screening mammogram will be mailed directly to the patient.

RECOMMENDATION:
Screening mammogram in one year. (Code:[id number])

BI-RADS CATEGORY  1: Negative.

## 2019-12-21 ENCOUNTER — Other Ambulatory Visit: Payer: Self-pay

## 2019-12-21 ENCOUNTER — Ambulatory Visit (INDEPENDENT_AMBULATORY_CARE_PROVIDER_SITE_OTHER)

## 2019-12-21 DIAGNOSIS — M7989 Other specified soft tissue disorders: Secondary | ICD-10-CM

## 2019-12-21 DIAGNOSIS — I5041 Acute combined systolic (congestive) and diastolic (congestive) heart failure: Secondary | ICD-10-CM | POA: Diagnosis not present

## 2019-12-21 DIAGNOSIS — I5043 Acute on chronic combined systolic (congestive) and diastolic (congestive) heart failure: Secondary | ICD-10-CM

## 2019-12-21 MED ORDER — PERFLUTREN LIPID MICROSPHERE
1.0000 mL | INTRAVENOUS | Status: AC | PRN
  Administered 2019-12-21: 2 mL via INTRAVENOUS

## 2019-12-22 ENCOUNTER — Telehealth: Payer: Self-pay

## 2019-12-22 NOTE — Telephone Encounter (Signed)
Call to patient to review echo results.    Pt verbalized understanding and has no further questions at this time.    Advised pt to call for any further questions or concerns.  No further orders.   

## 2019-12-22 NOTE — Telephone Encounter (Signed)
-----   Message from Sondra Barges, [place] sent at 12/21/2019  6:09 PM EDT ----- Echo showed normal pump function, normal wall motion, slightly stiffened heart, normal pressure in the right side of the heart, and no significant valvular abnormalities. Compared to prior study, echo is mostly unchanged, just some changes noted with hypertension.   Recommendations: -Optimal BP control, this was well controlled at her last visit

## 2020-01-05 ENCOUNTER — Ambulatory Visit: Admitting: Cardiovascular Disease

## 2020-01-16 MED ORDER — LISINOPRIL 20 MG PO TABS: 20.0000 mg | ORAL_TABLET | Freq: Two times a day (BID) | ORAL | 2 refills | Status: DC

## 2020-01-30 ENCOUNTER — Ambulatory Visit (INDEPENDENT_AMBULATORY_CARE_PROVIDER_SITE_OTHER): Admitting: Podiatry

## 2020-01-30 ENCOUNTER — Other Ambulatory Visit: Payer: Self-pay

## 2020-01-30 ENCOUNTER — Ambulatory Visit (INDEPENDENT_AMBULATORY_CARE_PROVIDER_SITE_OTHER)

## 2020-01-30 VITALS — Temp 97.3°F

## 2020-01-30 DIAGNOSIS — M10072 Idiopathic gout, left ankle and foot: Secondary | ICD-10-CM

## 2020-01-30 DIAGNOSIS — M779 Enthesopathy, unspecified: Secondary | ICD-10-CM

## 2020-01-30 MED ORDER — METHYLPREDNISOLONE 4 MG PO TBPK
ORAL_TABLET | ORAL | 0 refills | Status: DC
Start: 2020-01-30 — End: 2020-02-20

## 2020-01-30 MED ORDER — MELOXICAM 15 MG PO TABS
15.0000 mg | ORAL_TABLET | Freq: Every day | ORAL | 1 refills | Status: DC
Start: 2020-01-30 — End: 2020-05-15

## 2020-02-01 ENCOUNTER — Other Ambulatory Visit: Payer: Self-pay | Admitting: Podiatry

## 2020-02-01 DIAGNOSIS — M10072 Idiopathic gout, left ankle and foot: Secondary | ICD-10-CM

## 2020-02-02 NOTE — Progress Notes (Signed)
   HPI: 64 y.o. female presenting today with a chief complaint of a possible gout exacerbation that began 2-3 days ago. She reports severe associated pain, redness and swelling of the left dorsolateral midfoot. She has not done anything for pain. Walking increases her symptoms. Patient is here for further evaluation and treatment.   Past Medical History:  Diagnosis Date  . Atrial tachycardia ([facility])   . Diabetes mellitus without complication ([facility])   . HLD (hyperlipidemia)   . HTN (hypertension)   . Hx of bronchitis    uses MDI prn and on advair daily but no dx of asthma   . Hypothyroidism   . Overweight   . Palpitations    hx PAC"S     Physical Exam: General: The patient is alert and oriented x3 in no acute distress.  Dermatology: Skin is warm, dry and supple bilateral lower extremities. Negative for open lesions or macerations.  Vascular: Palpable pedal pulses bilaterally. Capillary refill within normal limits.  Neurological: Epicritic and protective threshold grossly intact bilaterally.   Musculoskeletal Exam: Pain on palpation to the left lateral midfoot with erythema and edema.  Radiographic Exam:  Normal osseous mineralization. Joint spaces preserved. No fracture/dislocation/boney destruction.    Assessment: 1. Acute gout left lateral midfoot    Plan of Care:  1. Patient evaluated. X-Rays reviewed.  2. Injection of 0.5 mLs Celestone Soluspan injected into the left lateral midfoot.  3. Prescription for Medrol Dose Pak provided to patient. 4. Prescription for Meloxicam 15 mg provided to patient. 5. Recommended good shoe gear.  6. Return to clinic as needed.       Felecia Shelling, DPM [facility]  Dr. Felecia Shelling, DPM    [address] [place], [place] [address]                Office [phone]  Fax [phone]

## 2020-02-17 ENCOUNTER — Encounter: Payer: Self-pay | Admitting: Podiatry

## 2020-02-17 NOTE — Telephone Encounter (Signed)
Patient is calling to check on a MY chart question concerning not being able to take Meloxicam and her gout has flared up again in left foot.. Please call

## 2020-02-20 ENCOUNTER — Telehealth: Payer: Self-pay | Admitting: *Deleted

## 2020-02-20 MED ORDER — METHYLPREDNISOLONE 4 MG PO TBPK: ORAL_TABLET | ORAL | 0 refills | Status: DC

## 2020-02-20 NOTE — Telephone Encounter (Signed)
MyChart message answered.

## 2020-02-20 NOTE — Telephone Encounter (Signed)
We could re-prescribe a prednisone pack. Other than that there aren't many other available options since all NSAIDs are not an option. - Dr. Logan Bores.

## 2020-03-09 ENCOUNTER — Ambulatory Visit (INDEPENDENT_AMBULATORY_CARE_PROVIDER_SITE_OTHER): Admitting: Sports Medicine

## 2020-03-09 ENCOUNTER — Other Ambulatory Visit: Payer: Self-pay

## 2020-03-09 ENCOUNTER — Encounter: Payer: Self-pay | Admitting: Sports Medicine

## 2020-03-09 DIAGNOSIS — M79672 Pain in left foot: Secondary | ICD-10-CM

## 2020-03-09 DIAGNOSIS — M779 Enthesopathy, unspecified: Secondary | ICD-10-CM | POA: Diagnosis not present

## 2020-03-09 DIAGNOSIS — M10072 Idiopathic gout, left ankle and foot: Secondary | ICD-10-CM | POA: Diagnosis not present

## 2020-03-09 MED ORDER — METHYLPREDNISOLONE 4 MG PO TBPK: ORAL_TABLET | ORAL | 1 refills | Status: DC

## 2020-03-09 NOTE — Progress Notes (Signed)
Subjective: Lori Phelps is a 64 y.o. female patient who presents to office for evaluation of left foot pain. Patient reports that she had pain on left lateral foot, but now pain is gone, denies redness, swelling, warmth, or pain.   Patient Active Problem List   Diagnosis Date Noted  . Dysuria 08/30/2019  . Urinary tract infectious disease 08/30/2019  . Complex endometrial hyperplasia 08/22/2015  . Atrial tachycardia (HCC)   . HTN (hypertension)   . HLD (hyperlipidemia)     Current Outpatient Medications on File Prior to Visit  Medication Sig Dispense Refill  . albuterol (PROVENTIL HFA;VENTOLIN HFA) 108 (90 BASE) MCG/ACT inhaler Inhale 2 puffs into the lungs every 6 (six) hours as needed for wheezing or shortness of breath.    . Ascorbic Acid (VITAMIN C PO) Take 1 tablet by mouth daily.    Marland Kitchen aspirin EC 81 MG tablet Take 81 mg by mouth daily.    . clotrimazole-betamethasone (LOTRISONE) cream as needed.    . Coenzyme Q10 (CO Q 10 PO) Take 1 capsule by mouth daily.    . colchicine 0.6 MG tablet Take 1 tablet (0.6 mg total) by mouth daily. 10 tablet 0  . diltiazem (CARDIZEM CD) 240 MG 24 hr capsule Take 1 capsule (240 mg total) by mouth daily. 90 capsule 3  . flecainide (TAMBOCOR) 50 MG tablet Take 1 tablet (50 mg total) by mouth 2 (two) times daily. May take an extra tablet for palpitations 180 tablet 3  . Fluticasone-Salmeterol (ADVAIR DISKUS) 250-50 MCG/DOSE AEPB Inhale 1 puff into the lungs every 12 (twelve) hours.      . furosemide (LASIX) 20 MG tablet Take 1 tablet (20 mg total) by mouth every other day. 45 tablet 3  . hydrALAZINE (APRESOLINE) 50 MG tablet Take 1 tablet (50 mg total) by mouth 3 (three) times daily. 270 tablet 3  . levothyroxine (SYNTHROID) 25 MCG tablet Take 1 tablet (25 mcg total) by mouth daily. 90 tablet 1  . lisinopril (ZESTRIL) 20 MG tablet Take 1 tablet (20 mg total) by mouth 2 (two) times daily. 180 tablet 2  . meloxicam (MOBIC) 15 MG tablet Take 1 tablet (15  mg total) by mouth daily. 30 tablet 1  . metFORMIN (GLUCOPHAGE) 500 MG tablet Take 500 mg by mouth 2 (two) times daily with a meal.    . methylPREDNISolone (MEDROL DOSEPAK) 4 MG TBPK tablet 6 day dose pack - take as directed 21 tablet 0  . metoprolol tartrate (LOPRESSOR) 25 MG tablet Take 1 tablet (25 mg total) by mouth 2 (two) times daily. 180 tablet 3  . Multiple Vitamin (MULTIVITAMIN) capsule Take 1 capsule by mouth daily.    . nitrofurantoin, macrocrystal-monohydrate, (MACROBID) 100 MG capsule 2 (two) times daily.    . Omega-3 Fatty Acids (FISH OIL PO) Take 2 capsules by mouth daily.    Marland Kitchen omeprazole (PRILOSEC) 20 MG capsule Take 20 mg by mouth daily.      . simvastatin (ZOCOR) 20 MG tablet Take 1 tablet (20 mg total) by mouth at bedtime. 90 tablet 3   No current facility-administered medications on file prior to visit.    Allergies  Allergen Reactions  . Penicillins Hives    Has patient had a PCN reaction causing immediate rash, facial/tongue/throat swelling, SOB or lightheadedness with hypotension: No Has patient had a PCN reaction causing severe rash involving mucus membranes or skin necrosis: No Has patient had a PCN reaction that required hospitalization No Has patient had a PCN  reaction occurring within the last 10 years: No If all of the above answers are "NO", then may proceed with Cephalosporin use.   . Sulfonamide Derivatives Rash    Objective:  General: Alert and oriented x3 in no acute distress  Dermatology: No open lesions bilateral lower extremities, no webspace macerations, no ecchymosis bilateral, all nails x 10 are well manicured.  Vascular: Dorsalis Pedis and Posterior Tibial pedal pulses palpable.  Neurology: Gross sensation intact via light touch bilateral.   Musculoskeletal: No reproducible tenderness to palpation to left foot.  Assessment and Plan: Problem List Items Addressed This Visit    None    Visit Diagnoses    Acute idiopathic gout of left foot     -  Primary   Capsulitis       Left foot pain           -Complete examination performed -Discussed treatment options for much improved foot pain with history of gout -Rx Medrol dose pak for patient to keep on file at pharmacy in case she has a flare -Encourage glycemic and diet control to prevent flare -Advised good supportive shoes daily for foot type -Patient to return to office as needed or sooner if condition worsens.  Asencion Islam, DPM

## 2020-05-03 ENCOUNTER — Telehealth (INDEPENDENT_AMBULATORY_CARE_PROVIDER_SITE_OTHER): Admitting: Legal Medicine

## 2020-05-03 ENCOUNTER — Encounter: Payer: Self-pay | Admitting: Legal Medicine

## 2020-05-03 VITALS — BP 131/59 | Ht 60.0 in | Wt 210.0 lb

## 2020-05-03 DIAGNOSIS — I80202 Phlebitis and thrombophlebitis of unspecified deep vessels of left lower extremity: Secondary | ICD-10-CM | POA: Diagnosis not present

## 2020-05-03 DIAGNOSIS — K2100 Gastro-esophageal reflux disease with esophagitis, without bleeding: Secondary | ICD-10-CM

## 2020-05-03 DIAGNOSIS — E1169 Type 2 diabetes mellitus with other specified complication: Secondary | ICD-10-CM

## 2020-05-03 DIAGNOSIS — I10 Essential (primary) hypertension: Secondary | ICD-10-CM | POA: Diagnosis not present

## 2020-05-03 DIAGNOSIS — J440 Chronic obstructive pulmonary disease with acute lower respiratory infection: Secondary | ICD-10-CM

## 2020-05-03 DIAGNOSIS — E782 Mixed hyperlipidemia: Secondary | ICD-10-CM | POA: Diagnosis not present

## 2020-05-03 DIAGNOSIS — J209 Acute bronchitis, unspecified: Secondary | ICD-10-CM

## 2020-05-03 DIAGNOSIS — E038 Other specified hypothyroidism: Secondary | ICD-10-CM

## 2020-05-03 DIAGNOSIS — E039 Hypothyroidism, unspecified: Secondary | ICD-10-CM | POA: Insufficient documentation

## 2020-05-03 MED ORDER — FLUTICASONE-SALMETEROL 250-50 MCG/DOSE IN AEPB: 1.0000 | INHALATION_SPRAY | Freq: Two times a day (BID) | RESPIRATORY_TRACT | 6 refills | Status: DC

## 2020-05-03 MED ORDER — SIMVASTATIN 20 MG PO TABS: 20.0000 mg | ORAL_TABLET | Freq: Every day | ORAL | 3 refills | Status: DC

## 2020-05-03 MED ORDER — OMEPRAZOLE 20 MG PO CPDR: 20.0000 mg | DELAYED_RELEASE_CAPSULE | Freq: Every day | ORAL | 2 refills | Status: DC

## 2020-05-03 MED ORDER — FUROSEMIDE 20 MG PO TABS: 20.0000 mg | ORAL_TABLET | ORAL | 3 refills | Status: DC

## 2020-05-03 MED ORDER — ACCU-CHEK AVIVA PLUS VI STRP: ORAL_STRIP | 12 refills | Status: DC

## 2020-05-03 MED ORDER — ACCU-CHEK SOFTCLIX LANCETS MISC: 12 refills | Status: DC

## 2020-05-03 MED ORDER — METFORMIN HCL 500 MG PO TABS: 500.0000 mg | ORAL_TABLET | Freq: Two times a day (BID) | ORAL | 2 refills | Status: DC

## 2020-05-03 MED ORDER — LEVOTHYROXINE SODIUM 25 MCG PO TABS: 25.0000 ug | ORAL_TABLET | Freq: Every day | ORAL | 2 refills | Status: DC

## 2020-05-03 NOTE — Progress Notes (Signed)
Subjective:  Patient ID: Lori Phelps, female    DOB: 11/29/1955  Age: 64 y.o. MRN: [id number]  Chief Complaint  Patient presents with   Hypertension   Hyperlipidemia   Gastroesophageal Reflux   COPD    HPI: chronic visit  Patient presents for follow up of hypertension.  Patient tolerating lisinopril, metoprolol well with side effects.  Patient was diagnosed with hypertension 2010 so has been treated for hypertension for 10 years.Patient is working on maintaining diet and exercise regimen and follows up as directed. Complication include none  Patient presents with hyperlipidemia.  Compliance with treatment has been good; patient takes medicines as directed, maintains low cholesterol diet, follows up as directed, and maintains exercise regimen.  Patient is using simvastatin without problems..   Patient has gastroesophageal reflux symptoms withesophagitis and LTRD.  The symptoms are moderate intensity.  Length of symptoms 10 years.  Medicines include omeprazole.  Complications include none.  Patient presents with diagnosis of COPD.  It is not secondary to prolonged asthma.  Diagnosis 10  Treatment includes advair.  The diagnosis has not been hospitalized for this diagnosis. Last none.  Patient is compliant with regular use of medicines.  Patient present with type 2 diabetes.  Specifically, this is type 2, noninsulin requiring diabetes, complicated by renal failure.  Compliance with treatment has been good; patient take medicines as directed, maintains diet and exercise regimen, follows up as directed, and is keeping glucose diary.  Date of  diagnosis 10.  Depression screen has been performed.Tobacco screen nonsmoker. Current medicines for diabetes metformin.  Patient is on lisinopril for renal protection and simvastatin for cholesterol control.  Patient performs foot exams daily and last ophthalmologic exam was none. Current Outpatient Medications on File Prior to Visit  Medication Sig  Dispense Refill   albuterol (PROVENTIL HFA;VENTOLIN HFA) 108 (90 BASE) MCG/ACT inhaler Inhale 2 puffs into the lungs every 6 (six) hours as needed for wheezing or shortness of breath.     Ascorbic Acid (VITAMIN C PO) Take 1 tablet by mouth daily.     aspirin EC 81 MG tablet Take 81 mg by mouth daily.     clotrimazole-betamethasone (LOTRISONE) cream as needed.     Coenzyme Q10 (CO Q 10 PO) Take 1 capsule by mouth daily.     diltiazem (CARDIZEM CD) 240 MG 24 hr capsule Take 1 capsule (240 mg total) by mouth daily. 90 capsule 3   flecainide (TAMBOCOR) 50 MG tablet Take 1 tablet (50 mg total) by mouth 2 (two) times daily. May take an extra tablet for palpitations 180 tablet 3   lisinopril (ZESTRIL) 20 MG tablet Take 1 tablet (20 mg total) by mouth 2 (two) times daily. 180 tablet 2   meloxicam (MOBIC) 15 MG tablet Take 1 tablet (15 mg total) by mouth daily. 30 tablet 1   metoprolol tartrate (LOPRESSOR) 25 MG tablet Take 1 tablet (25 mg total) by mouth 2 (two) times daily. 180 tablet 3   Multiple Vitamin (MULTIVITAMIN) capsule Take 1 capsule by mouth daily.     nitrofurantoin, macrocrystal-monohydrate, (MACROBID) 100 MG capsule 2 (two) times daily.     Omega-3 Fatty Acids (FISH OIL PO) Take 2 capsules by mouth daily.     hydrALAZINE (APRESOLINE) 50 MG tablet Take 1 tablet (50 mg total) by mouth 3 (three) times daily. 270 tablet 3   No current facility-administered medications on file prior to visit.   Past Medical History:  Diagnosis Date   Atrial tachycardia (HCC)  Chronic atrial fibrillation (HCC)    Diabetes mellitus without complication (HCC)    Essential hypertension    GERD (gastroesophageal reflux disease)    HLD (hyperlipidemia)    HTN (hypertension)    Hx of bronchitis    uses MDI prn and on advair daily but no dx of asthma    Hypothyroidism    Mixed hyperlipidemia    Overweight    Palpitations    hx PAC"S   Phlebitis and thrombophlebitis of deep  vessels of lower extremities, left (HCC)    Type 2 diabetes mellitus with other specified complication [facility](HCC)    Past Surgical History:  Procedure Laterality Date   BACK SURGERY  1994   COLONOSCOPY  2002   DILATATION & CURETTAGE/HYSTEROSCOPY WITH MYOSURE N/A 08/22/2015   Procedure: DILATATION & CURETTAGE/HYSTEROSCOPY WITH MYOSURE AND POLYPECTOMY;  Surgeon: Huel CoteKathy Richardson, MD;  Location: [facility];  Service: Gynecology;  Laterality: N/A;   NASAL SINUS SURGERY     [id number]    Family History  Problem Relation Age of Onset   Kidney disease Other    Kidney disease Mother    Heart failure Father    Colon cancer Neg Hx    Colon polyps Neg Hx    Esophageal cancer Neg Hx    Rectal cancer Neg Hx    Stomach cancer Neg Hx    Social History   Socioeconomic History   Marital status: Married    Spouse name: Not on file   Number of children: 2   Years of education: Not on file   Highest education level: Not on file  Occupational History   Not on file  Tobacco Use   Smoking status: Former Smoker   Smokeless tobacco: Never Used  Building services engineerVaping Use   Vaping Use: Never used  Substance and Sexual Activity   Alcohol use: No    Alcohol/week: 0.0 standard drinks   Drug use: No   Sexual activity: Not on file  Other Topics Concern   Not on file  Social History Narrative   [place].  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   Social Determinants of Health   Financial Resource Strain:    Difficulty of Paying Living Expenses: Not on file  Food Insecurity:    Worried About Programme researcher, broadcasting/film/videounning Out of Food in the Last Year: Not on file   [employer] Out of Food in the Last Year: Not on file  Transportation Needs:    Lack of Transportation (Medical): Not on file   Lack of Transportation (Non-Medical): Not on file  Physical Activity:    Days of Exercise per Week: Not on file   Minutes of Exercise per Session: Not on file  Stress:    Feeling of  Stress : Not on file  Social Connections:    Frequency of Communication with Friends and Family: Not on file   Frequency of Social Gatherings with Friends and Family: Not on file   Attends Religious Services: Not on file   Active Member of Clubs or Organizations: Not on file   Attends BankerClub or Organization Meetings: Not on file   Marital Status: Not on file    Review of Systems  Constitutional: Negative.   HENT: Negative.   Eyes: Negative.   Respiratory: Negative for choking, wheezing and stridor.   Cardiovascular: Negative for chest pain.  Gastrointestinal: Negative.   Endocrine: Negative.   Genitourinary: Negative.   Musculoskeletal: Negative.   Skin: Negative.   Neurological:  Negative.   Psychiatric/Behavioral: Negative.      Objective:  BP (!) 131/59    Ht 5' (1.524 m)    Wt 210 lb (95.3 kg)    LMP 05/21/2015 Comment: irregular since age 34    BMI 41.01 kg/m   BP/Weight 05/03/2020 10/06/2019 06/10/2019  Systolic BP 131 122 134  Diastolic BP 59 64 72  Wt. (Lbs) 210 209.25 208  BMI 41.01 39.54 39.3    Physical Exam vs reviewed    Lab Results  Component Value Date   WBC 8.2 10/25/2018   HGB 13.7 10/25/2018   HCT 41.1 10/25/2018   PLT 228 10/25/2018   GLUCOSE 95 10/06/2019   NA 138 10/06/2019   K 5.1 10/06/2019   CL 103 10/06/2019   CREATININE 1.09 (H) 10/06/2019   BUN 28 (H) 10/06/2019   CO2 18 (L) 10/06/2019   TSH 1.930 10/25/2018   HGBA1C 5.1 05/09/2016      Assessment & Plan:   1. Essential hypertension - furosemide (LASIX) 20 MG tablet; Take 1 tablet (20 mg total) by mouth every other day.  Dispense: 45 tablet; Refill: 3 An individual hypertension care plan was established and reinforced today.  The patient's status was assessed using clinical findings on exam and labs or diagnostic tests. The patient's success at meeting treatment goals on disease specific evidence-based guidelines and found to be well controlled. SELF MANAGEMENT: The patient  and I together assessed ways to personally work towards obtaining the recommended goals. RECOMMENDATIONS: avoid decongestants found in common cold remedies, decrease consumption of alcohol, perform routine monitoring of BP with home BP cuff, exercise, reduction of dietary salt, take medicines as prescribed, try not to miss doses and quit smoking.  Regular exercise and maintaining a healthy weight is needed.  Stress reduction may help. A CLINICAL SUMMARY including written plan identify barriers to care unique to individual due to social or financial issues.  We attempt to mutually creat solutions for individual and family understanding.  2. Mixed hyperlipidemia - simvastatin (ZOCOR) 20 MG tablet; Take 1 tablet (20 mg total) by mouth at bedtime.  Dispense: 90 tablet; Refill: 3 AN INDIVIDUAL CARE PLAN for hyperlipidemia/ cholesterol was established and reinforced today.  The patient's status was assessed using clinical findings on exam, lab and other diagnostic tests. The patient's disease status was assessed based on evidence-based guidelines and found to be well controlled. MEDICATIONS were reviewed. SELF MANAGEMENT GOALS have been discussed and patient's success at attaining the goal of low cholesterol was assessed. RECOMMENDATION given include regular exercise 3 days a week and low cholesterol/low fat diet. CLINICAL SUMMARY including written plan to identify barriers unique to the patient due to social or economic  reasons was discussed.   3. Type 2 diabetes mellitus with other specified complication, unspecified whether long term insulin use (HCC) - metFORMIN (GLUCOPHAGE) 500 MG tablet; Take 1 tablet (500 mg total) by mouth 2 (two) times daily with a meal.  Dispense: 180 tablet; Refill: 2 - glucose blood (ACCU-CHEK AVIVA PLUS) test strip; Use as instructed  Dispense: 100 each; Refill: 12 - Accu-Chek Softclix Lancets lancets; Use as instructed  Dispense: 100 each; Refill: 12 An individual care plan  for diabetes was established and reinforced today.  The patient's status was assessed using clinical findings on exam, labs and diagnostic testing. Patient success at meeting goals based on disease specific evidence-based guidelines and found to be good controlled. Medications were assessed and patient's understanding of the medical issues , including barriers  were assessed. Recommend adherence to a diabetic diet, a graduated exercise program, HgbA1c level is checked quarterly, and urine microalbumin performed yearly .  Annual mono-filament sensation testing performed. Lower blood pressure and control hyperlipidemia is important. Get annual eye exams and annual flu shots and smoking cessation discussed.  Self management goals were discussed.  4. Other specified hypothyroidism - levothyroxine (SYNTHROID) 25 MCG tablet; Take 1 tablet (25 mcg total) by mouth daily.  Dispense: 90 tablet;  Patient is known to have hypothryoid and is n treatment with levothyropxine .  Patient was diagnosed 10 years ago.  Other treatment includes none.  Patient is compliant with medicines and last TSH 6 months ago.  Last TSH was normal.  6. Gastroesophageal reflux disease with esophagitis without hemorrhage - omeprazole (PRILOSEC) 20 MG capsule; Take 1 capsule (20 mg total) by mouth daily.  Dispense: 90 capsule; Refill: 2 Plan of care was formulated today.  She is doing well.  A plan of care was formulated using patient exam, tests and other sources to optimize care using evidence based information.  Recommend no smoking, no eating after supper, avoid fatty foods, elevate Head of bed, avoid tight fitting clothing.  Continue on omeprazole.  7. Acute bronchitis with COPD (HCC) - Fluticasone-Salmeterol (ADVAIR DISKUS) 250-50 MCG/DOSE AEPB; Inhale 1 puff into the lungs every 12 (twelve) hours.  Dispense: 60 each; Refill: 6 An individualize plan was formulated for care of COPD.  Treatment is evidence based.  She will continue  on inhalers, avoid smoking and smoke.  Regular exercise with help with dyspnea. Routine follow ups and medication compliance is needed.    Meds ordered this encounter  Medications   metFORMIN (GLUCOPHAGE) 500 MG tablet    Sig: Take 1 tablet (500 mg total) by mouth 2 (two) times daily with a meal.    Dispense:  180 tablet    Refill:  2   simvastatin (ZOCOR) 20 MG tablet    Sig: Take 1 tablet (20 mg total) by mouth at bedtime.    Dispense:  90 tablet    Refill:  3   omeprazole (PRILOSEC) 20 MG capsule    Sig: Take 1 capsule (20 mg total) by mouth daily.    Dispense:  90 capsule    Refill:  2   levothyroxine (SYNTHROID) 25 MCG tablet    Sig: Take 1 tablet (25 mcg total) by mouth daily.    Dispense:  90 tablet    Refill:  2   furosemide (LASIX) 20 MG tablet    Sig: Take 1 tablet (20 mg total) by mouth every other day.    Dispense:  45 tablet    Refill:  3   Fluticasone-Salmeterol (ADVAIR DISKUS) 250-50 MCG/DOSE AEPB    Sig: Inhale 1 puff into the lungs every 12 (twelve) hours.    Dispense:  60 each    Refill:  6   glucose blood (ACCU-CHEK AVIVA PLUS) test strip    Sig: Use as instructed    Dispense:  100 each    Refill:  12   Accu-Chek Softclix Lancets lancets    Sig: Use as instructed    Dispense:  100 each    Refill:  12       Follow-up: Return in about 3 months (around 08/03/2020) for fasting.  An After Visit Summary was printed and given to the patient.  Brent Bulla [facility] [phone]

## 2020-05-15 ENCOUNTER — Encounter: Payer: Self-pay | Admitting: Cardiovascular Disease

## 2020-05-15 ENCOUNTER — Other Ambulatory Visit: Payer: Self-pay

## 2020-05-15 ENCOUNTER — Ambulatory Visit (INDEPENDENT_AMBULATORY_CARE_PROVIDER_SITE_OTHER): Admitting: Cardiovascular Disease

## 2020-05-15 VITALS — BP 128/66 | HR 62 | Ht 60.0 in | Wt 214.4 lb

## 2020-05-15 DIAGNOSIS — E785 Hyperlipidemia, unspecified: Secondary | ICD-10-CM

## 2020-05-15 DIAGNOSIS — R079 Chest pain, unspecified: Secondary | ICD-10-CM | POA: Diagnosis not present

## 2020-05-15 DIAGNOSIS — I10 Essential (primary) hypertension: Secondary | ICD-10-CM

## 2020-05-15 DIAGNOSIS — I471 Supraventricular tachycardia: Secondary | ICD-10-CM | POA: Diagnosis not present

## 2020-05-15 MED ORDER — FUROSEMIDE 20 MG PO TABS: 20.0000 mg | ORAL_TABLET | ORAL | 3 refills | Status: DC

## 2020-05-15 MED ORDER — DILTIAZEM HCL ER COATED BEADS 240 MG PO CP24: 240.0000 mg | ORAL_CAPSULE | Freq: Every day | ORAL | 3 refills | Status: DC

## 2020-05-15 MED ORDER — HYDRALAZINE HCL 50 MG PO TABS: 50.0000 mg | ORAL_TABLET | Freq: Three times a day (TID) | ORAL | 3 refills | Status: DC

## 2020-05-15 MED ORDER — FLECAINIDE ACETATE 50 MG PO TABS
50.0000 mg | ORAL_TABLET | Freq: Two times a day (BID) | ORAL | 3 refills | Status: DC
Start: 2020-05-15 — End: 2021-07-25

## 2020-05-15 MED ORDER — METOPROLOL TARTRATE 25 MG PO TABS
25.0000 mg | ORAL_TABLET | Freq: Two times a day (BID) | ORAL | 3 refills | Status: DC
Start: 2020-05-15 — End: 2021-07-25

## 2020-05-15 MED ORDER — LISINOPRIL 20 MG PO TABS
20.0000 mg | ORAL_TABLET | Freq: Two times a day (BID) | ORAL | 2 refills | Status: DC
Start: 2020-05-15 — End: 2021-06-03

## 2020-05-15 NOTE — Patient Instructions (Signed)
Medication Instructions:  The current medical regimen is effective;  continue present plan and medications.  *If you need a refill on your cardiac medications before your next appointment, please call your pharmacy*   Testing/Procedures: Your physician has requested that you have a lexiscan myoview. A cardiac stress test is a cardiological test that measures the heart's ability to respond to external stress in a controlled clinical environment. The stress response is induced by intravenous pharmacological stimulation.    Follow-Up: At [facility], you and your health needs are our priority.  As part of our continuing mission to provide you with exceptional heart care, we have created designated Provider Care Teams.  These Care Teams include your primary Cardiologist (physician) and Advanced Practice Providers (APPs -  Physician Assistants and Nurse Practitioners) who all work together to provide you with the care you need, when you need it.  We recommend signing up for the patient portal called "MyChart".  Sign up information is provided on this After Visit Summary.  MyChart is used to connect with patients for Virtual Visits (Telemedicine).  Patients are able to view lab/test results, encounter notes, upcoming appointments, etc.  Non-urgent messages can be sent to your provider as well.   To learn more about what you can do with MyChart, go to [email or web address].    Your next appointment:   6 month(s)  The format for your next appointment:   In Person  Provider:   Lorine Bears, MD

## 2020-05-15 NOTE — Progress Notes (Signed)
Cardiology Office Note   Date:  05/15/2020   ID:  Lori Phelps, DOB June 15, 1956, MRN [id number]  PCP:  Abigail Miyamoto, MD  Cardiologist:   Lorine Bears, MD   No chief complaint on file.     History of Present Illness: Lori Phelps is a 64 y.o. female who presents for regarding palpitations due to nonsustained atrial tachycardia and brief nonsustained atrial fibrillation.  She has type 2 diabetes, essential hypertension and hypothyroidism. She has been followed by Dr. Johney Frame in the past for arrhythmia.  Symptoms have been controlled with flecainide, diltiazem and metoprolol. Echocardiogram in July 2017 showed normal LV systolic function with no significant valvular abnormalities. She had a repeat echocardiogram in April of this year which showed normal LV systolic function, indeterminate diastolic function, normal pulmonary pressure and no significant valvular abnormalities. No significant palpitations but she reports recent episodes of intermittent chest discomfort described as gas happening both at rest and with exertion.  She does have baseline exertional dyspnea with no significant worsening.  No recent ischemic cardiac evaluation.   .  Past Medical History:  Diagnosis Date  . Atrial tachycardia (HCC)   . Chronic atrial fibrillation (HCC)   . Diabetes mellitus without complication (HCC)   . Essential hypertension   . GERD (gastroesophageal reflux disease)   . HLD (hyperlipidemia)   . HTN (hypertension)   . Hx of bronchitis    uses MDI prn and on advair daily but no dx of asthma   . Hypothyroidism   . Mixed hyperlipidemia   . Overweight   . Palpitations    hx PAC"S  . Phlebitis and thrombophlebitis of deep vessels of lower extremities, left (HCC)   . Type 2 diabetes mellitus with other specified complication [facility])     Past Surgical History:  Procedure Laterality Date  . BACK SURGERY  1994  . COLONOSCOPY  2002  . DILATATION & CURETTAGE/HYSTEROSCOPY WITH  MYOSURE N/A 08/22/2015   Procedure: DILATATION & CURETTAGE/HYSTEROSCOPY WITH MYOSURE AND POLYPECTOMY;  Surgeon: Huel Cote, MD;  Location: [facility];  Service: Gynecology;  Laterality: N/A;  . NASAL SINUS SURGERY     [id number]     Current Outpatient Medications  Medication Sig Dispense Refill  . Accu-Chek Softclix Lancets lancets Use as instructed 100 each 12  . albuterol (PROVENTIL HFA;VENTOLIN HFA) 108 (90 BASE) MCG/ACT inhaler Inhale 2 puffs into the lungs every 6 (six) hours as needed for wheezing or shortness of breath.    Marland Kitchen aspirin EC 81 MG tablet Take 81 mg by mouth daily.    Marland Kitchen diltiazem (CARDIZEM CD) 240 MG 24 hr capsule Take 1 capsule (240 mg total) by mouth daily. 90 capsule 3  . flecainide (TAMBOCOR) 50 MG tablet Take 1 tablet (50 mg total) by mouth 2 (two) times daily. May take an extra tablet for palpitations 180 tablet 3  . Fluticasone-Salmeterol (ADVAIR DISKUS) 250-50 MCG/DOSE AEPB Inhale 1 puff into the lungs every 12 (twelve) hours. 60 each 6  . furosemide (LASIX) 20 MG tablet Take 1 tablet (20 mg total) by mouth every other day. 45 tablet 3  . glucose blood (ACCU-CHEK AVIVA PLUS) test strip Use as instructed 100 each 12  . levothyroxine (SYNTHROID) 25 MCG tablet Take 1 tablet (25 mcg total) by mouth daily. 90 tablet 2  . lisinopril (ZESTRIL) 20 MG tablet Take 1 tablet (20 mg total) by mouth 2 (two) times daily. 180 tablet 2  . metFORMIN (GLUCOPHAGE) 500 MG tablet Take 1  tablet (500 mg total) by mouth 2 (two) times daily with a meal. 180 tablet 2  . metoprolol tartrate (LOPRESSOR) 25 MG tablet Take 1 tablet (25 mg total) by mouth 2 (two) times daily. 180 tablet 3  . Multiple Vitamin (MULTIVITAMIN) capsule Take 1 capsule by mouth daily.    . Omega-3 Fatty Acids (FISH OIL PO) Take 2 capsules by mouth daily.    Marland Kitchen omeprazole (PRILOSEC) 20 MG capsule Take 1 capsule (20 mg total) by mouth daily. 90 capsule 2  . simvastatin (ZOCOR) 20 MG tablet Take 1 tablet (20 mg total) by  mouth at bedtime. 90 tablet 3  . hydrALAZINE (APRESOLINE) 50 MG tablet Take 1 tablet (50 mg total) by mouth 3 (three) times daily. 270 tablet 3   No current facility-administered medications for this visit.    Allergies:   Penicillins and Sulfonamide derivatives    Social History:  The patient  reports that she has quit smoking. She has never used smokeless tobacco. She reports that she does not drink alcohol and does not use drugs.   Family History:  The patient's family history includes Heart failure in her father; Kidney disease in her mother and another family member.    ROS:  Please see the history of present illness.   Otherwise, review of systems are positive for none.   All other systems are reviewed and negative.    PHYSICAL EXAM: VS:  BP 128/66   Pulse 62   Ht 5' (1.524 m)   Wt 214 lb 6.4 oz (97.3 kg)   LMP 05/21/2015 Comment: irregular since age 71   SpO2 97%   BMI 41.87 kg/m  , BMI Body mass index is 41.87 kg/m. GEN: Well nourished, well developed, in no acute distress  HEENT: normal  Neck: no JVD, carotid bruits, or masses Cardiac: RRR; no murmurs, rubs, or gallops,no edema  Respiratory:  clear to auscultation bilaterally, normal work of breathing GI: soft, nontender, nondistended, + BS MS: no deformity or atrophy  Skin: warm and dry, no rash Neuro:  Strength and sensation are intact Psych: euthymic mood, full affect   EKG:  EKG is not ordered today. EKG from February showed sinus rhythm with no significant ischemic changes.   Recent Labs: 10/06/2019: BUN 28; Creatinine, Ser 1.09; Potassium 5.1; Sodium 138    Lipid Panel No results found for: CHOL, TRIG, HDL, CHOLHDL, VLDL, LDLCALC, LDLDIRECT    Wt Readings from Last 3 Encounters:  05/15/20 214 lb 6.4 oz (97.3 kg)  05/03/20 210 lb (95.3 kg)  10/06/19 209 lb 4 oz (94.9 kg)       ASSESSMENT AND PLAN:  1.  Palpitations due to PACs, nonsustained atrial tachycardia and nonsustained atrial  fibrillation:  Symptoms are well controlled on current medications including flecainide, metoprolol and diltiazem.  These were refilled.  2. Essential hypertension: Blood pressures well controlled on current medications.  3. Diabetes mellitus:  managed by primary care physician.  4.  Hyperlipidemia: Currently on simvastatin.  5.  Recent episode of atypical chest pain: Multiple risk factors for coronary artery disease.  I requested Lexiscan Myoview.  She is not able to exercise on a treadmill.   Disposition:   Follow-up in 6 months.  Signed,  Lorine Bears, MD  05/15/2020 9:11 AM    [facility]

## 2020-05-17 ENCOUNTER — Telehealth (HOSPITAL_COMMUNITY): Payer: Self-pay

## 2020-05-17 NOTE — Telephone Encounter (Signed)
Encounter complete. 

## 2020-05-22 ENCOUNTER — Other Ambulatory Visit: Payer: Self-pay

## 2020-05-22 ENCOUNTER — Ambulatory Visit (HOSPITAL_COMMUNITY)
Admission: RE | Admit: 2020-05-22 | Discharge: 2020-05-22 | Disposition: A | Discharge status: Home / Self Care | Source: Ambulatory Visit | Attending: Internal Medicine | Admitting: Internal Medicine

## 2020-05-22 DIAGNOSIS — I10 Essential (primary) hypertension: Secondary | ICD-10-CM | POA: Diagnosis not present

## 2020-05-22 DIAGNOSIS — R079 Chest pain, unspecified: Secondary | ICD-10-CM | POA: Diagnosis not present

## 2020-05-22 LAB — MYOCARDIAL PERFUSION IMAGING
LV dias vol: 105 mL (ref 46–106)
LV sys vol: 36 mL
Peak HR: 89 {beats}/min
Rest HR: 66 {beats}/min
SDS: 1
SRS: 7
SSS: 8
TID: 1.19

## 2020-05-22 MED ORDER — TECHNETIUM TC 99M TETROFOSMIN IV KIT
31.0000 | PACK | Freq: Once | INTRAVENOUS | Status: AC | PRN
  Administered 2020-05-22: 31 via INTRAVENOUS
  Filled 2020-05-22: qty 31

## 2020-05-22 MED ORDER — REGADENOSON 0.4 MG/5ML IV SOLN
0.4000 mg | Freq: Once | INTRAVENOUS | Status: AC
  Administered 2020-05-22: 0.4 mg via INTRAVENOUS

## 2020-05-22 MED ORDER — TECHNETIUM TC 99M TETROFOSMIN IV KIT
10.9000 | PACK | Freq: Once | INTRAVENOUS | Status: AC | PRN
  Administered 2020-05-22: 10.9 via INTRAVENOUS
  Filled 2020-05-22: qty 11

## 2020-08-14 ENCOUNTER — Ambulatory Visit: Admitting: Cardiovascular Disease

## 2020-09-05 ENCOUNTER — Telehealth (INDEPENDENT_AMBULATORY_CARE_PROVIDER_SITE_OTHER): Admitting: Legal Medicine

## 2020-09-05 ENCOUNTER — Encounter: Payer: Self-pay | Admitting: Legal Medicine

## 2020-09-05 VITALS — BP 136/80 | Temp 98.8°F | Ht 60.0 in | Wt 212.0 lb

## 2020-09-05 DIAGNOSIS — J180 Bronchopneumonia, unspecified organism: Secondary | ICD-10-CM | POA: Diagnosis not present

## 2020-09-05 MED ORDER — PREDNISONE 10 MG (21) PO TBPK: ORAL_TABLET | ORAL | 0 refills | Status: DC

## 2020-09-05 MED ORDER — DOXYCYCLINE HYCLATE 100 MG PO TABS: 100.0000 mg | ORAL_TABLET | Freq: Two times a day (BID) | ORAL | 0 refills | Status: DC

## 2020-09-05 NOTE — Progress Notes (Signed)
Virtual Visit via Telephone Note   This visit type was conducted due to national recommendations for restrictions regarding the COVID-19 Pandemic (e.g. social distancing) in an effort to limit this patient's exposure and mitigate transmission in our community.  Due to her co-morbid illnesses, this patient is at least at moderate risk for complications without adequate follow up.  This format is felt to be most appropriate for this patient at this time.  The patient did not have access to video technology/had technical difficulties with video requiring transitioning to audio format only (telephone).  All issues noted in this document were discussed and addressed.  No physical exam could be performed with this format.  Patient verbally consented to a telehealth visit.   Date:  09/05/2020   ID:  Lori Phelps, DOB [birth year], MRN [id number]  Patient Location: Home Provider Location: Office/Clinic  PCP:  Abigail Miyamoto, MD   Evaluation Performed:  New Patient Evaluation  Chief Complaint:  Cough 3 days ago  History of Present Illness:    Lori Phelps is a 64 y.o. female with cough which started 3 days ago, patient has been taking cough medicine that is expired which has helped some. Patient states she feels that she is getting bronchitis. No headache.   The patient does not have symptoms concerning for COVID-19 infection (fever, chills, cough, or new shortness of breath).    Past Medical History:  Diagnosis Date  . Atrial tachycardia (HCC)   . Chronic atrial fibrillation (HCC)   . Diabetes mellitus without complication (HCC)   . Essential hypertension   . GERD (gastroesophageal reflux disease)   . HLD (hyperlipidemia)   . HTN (hypertension)   . Hx of bronchitis    uses MDI prn and on advair daily but no dx of asthma   . Hypothyroidism   . Mixed hyperlipidemia   . Overweight   . Palpitations    hx PAC"S  . Phlebitis and thrombophlebitis of deep vessels of lower  extremities, left (HCC)   . Type 2 diabetes mellitus with other specified complication [facility])     Past Surgical History:  Procedure Laterality Date  . BACK SURGERY  1994  . COLONOSCOPY  2002  . DILATATION & CURETTAGE/HYSTEROSCOPY WITH MYOSURE N/A 08/22/2015   Procedure: DILATATION & CURETTAGE/HYSTEROSCOPY WITH MYOSURE AND POLYPECTOMY;  Surgeon: Huel Cote, MD;  Location: [facility];  Service: Gynecology;  Laterality: N/A;  . NASAL SINUS SURGERY     [id number]    Family History  Problem Relation Age of Onset  . Kidney disease Other   . Kidney disease Mother   . Heart failure Father   . Colon cancer Neg Hx   . Colon polyps Neg Hx   . Esophageal cancer Neg Hx   . Rectal cancer Neg Hx   . Stomach cancer Neg Hx     Social History   Socioeconomic History  . Marital status: Married    Spouse name: Not on file  . Number of children: 2  . Years of education: Not on file  . Highest education level: Not on file  Occupational History  . Not on file  Tobacco Use  . Smoking status: Former Games developer  . Smokeless tobacco: Never Used  Vaping Use  . Vaping Use: Never used  Substance and Sexual Activity  . Alcohol use: No    Alcohol/week: 0.0 standard drinks  . Drug use: No  . Sexual activity: Not on file  Other Topics Concern  . Not  on file  Social History Narrative   Pt lives in [place] with spouse.  Retired Engineer, water for [employer].   Social Determinants of Health   Financial Resource Strain: Not on file  Food Insecurity: Not on file  Transportation Needs: Not on file  Physical Activity: Not on file  Stress: Not on file  Social Connections: Not on file  Intimate Partner Violence: Not on file    Outpatient Medications Prior to Visit  Medication Sig Dispense Refill  . Accu-Chek Softclix Lancets lancets Use as instructed 100 each 12  . albuterol (PROVENTIL HFA;VENTOLIN HFA) 108 (90 BASE) MCG/ACT inhaler Inhale 2 puffs into the lungs every 6 (six)  hours as needed for wheezing or shortness of breath.    Marland Kitchen aspirin EC 81 MG tablet Take 81 mg by mouth daily.    Marland Kitchen diltiazem (CARDIZEM CD) 240 MG 24 hr capsule Take 1 capsule (240 mg total) by mouth daily. 90 capsule 3  . flecainide (TAMBOCOR) 50 MG tablet Take 1 tablet (50 mg total) by mouth 2 (two) times daily. May take an extra tablet for palpitations 180 tablet 3  . Fluticasone-Salmeterol (ADVAIR DISKUS) 250-50 MCG/DOSE AEPB Inhale 1 puff into the lungs every 12 (twelve) hours. 60 each 6  . furosemide (LASIX) 20 MG tablet Take 1 tablet (20 mg total) by mouth every other day. 45 tablet 3  . glucose blood (ACCU-CHEK AVIVA PLUS) test strip Use as instructed 100 each 12  . hydrALAZINE (APRESOLINE) 50 MG tablet Take 1 tablet (50 mg total) by mouth 3 (three) times daily. 270 tablet 3  . levothyroxine (SYNTHROID) 25 MCG tablet Take 1 tablet (25 mcg total) by mouth daily. 90 tablet 2  . lisinopril (ZESTRIL) 20 MG tablet Take 1 tablet (20 mg total) by mouth 2 (two) times daily. 180 tablet 2  . metFORMIN (GLUCOPHAGE) 500 MG tablet Take 1 tablet (500 mg total) by mouth 2 (two) times daily with a meal. 180 tablet 2  . metoprolol tartrate (LOPRESSOR) 25 MG tablet Take 1 tablet (25 mg total) by mouth 2 (two) times daily. 180 tablet 3  . Multiple Vitamin (MULTIVITAMIN) capsule Take 1 capsule by mouth daily.    . Omega-3 Fatty Acids (FISH OIL PO) Take 2 capsules by mouth daily.    Marland Kitchen omeprazole (PRILOSEC) 20 MG capsule Take 1 capsule (20 mg total) by mouth daily. 90 capsule 2  . simvastatin (ZOCOR) 20 MG tablet Take 1 tablet (20 mg total) by mouth at bedtime. 90 tablet 3   No facility-administered medications prior to visit.    Allergies:   Penicillins and Sulfonamide derivatives   Social History   Tobacco Use  . Smoking status: Former Games developer  . Smokeless tobacco: Never Used  Vaping Use  . Vaping Use: Never used  Substance Use Topics  . Alcohol use: No    Alcohol/week: 0.0 standard drinks  . Drug  use: No     Review of Systems  Constitutional: Negative for chills and fever.  HENT: Negative for congestion, sinus pain and sore throat.   Eyes: Positive for redness.  Respiratory: Positive for cough and sputum production.   Cardiovascular: Negative for chest pain and claudication.  Gastrointestinal: Negative for heartburn and melena.  Genitourinary: Negative for dysuria.  Musculoskeletal: Negative for myalgias and neck pain.  Neurological: Negative for headaches.  Psychiatric/Behavioral: Negative.      Labs/Other Tests and Data Reviewed:    Recent Labs: 10/06/2019: BUN 28; Creatinine, Ser 1.09; Potassium  5.1; Sodium 138   Recent Lipid Panel No results found for: CHOL, TRIG, HDL, CHOLHDL, LDLCALC, LDLDIRECT  Wt Readings from Last 3 Encounters:  05/22/20 214 lb (97.1 kg)  05/15/20 214 lb 6.4 oz (97.3 kg)  05/03/20 210 lb (95.3 kg)     Objective:    Vital Signs:  LMP 05/21/2015 Comment: irregular since age 85    Physical Exam na  ASSESSMENT & PLAN:   Diagnoses and all orders for this visit: Bronchial pneumonia -     doxycycline (VIBRA-TABS) 100 MG tablet; Take 1 tablet (100 mg total) by mouth 2 (two) times daily. -     predniSONE (STERAPRED UNI-PAK 21 TAB) 10 MG (21) TBPK tablet; Take 6ills first day , then 5 pills day 2 and then cut down one pill day until gone        COVID-19 Education: The signs and symptoms of COVID-19 were discussed with the patient and how to seek care for testing (follow up with PCP or arrange E-visit). The importance of social distancing was discussed today.   I spent 20 minutes dedicated to the care of this patient on the date of this encounter to include face-to-face time with the patient, as well as: I also discussed that she has been getting most of her care at [facility] and other medical facilities and we have not seen her in 1 year for her diabetes she has seen her renal doctor who did do an A1c.  I strongly recommended she make a  chronic problem.  Follow Up:  In Person prn  Signed,  Horald Pollen, CMA  09/05/2020 3:27 PM    [facility]

## 2020-09-11 ENCOUNTER — Telehealth (INDEPENDENT_AMBULATORY_CARE_PROVIDER_SITE_OTHER): Admitting: Family Medicine

## 2020-09-11 DIAGNOSIS — R059 Cough, unspecified: Secondary | ICD-10-CM | POA: Insufficient documentation

## 2020-09-11 DIAGNOSIS — U071 COVID-19: Secondary | ICD-10-CM | POA: Diagnosis not present

## 2020-09-11 LAB — POC COVID19 BINAXNOW: SARS Coronavirus 2 Ag: POSITIVE — AB

## 2020-09-11 NOTE — Progress Notes (Addendum)
Virtual Visit via Telephone Note  I connected with Lori Phelps on 09/11/20 at  3:30 PM EST by telephone and verified that I am speaking with the correct person using two identifiers.  Location: Patient: car in parking lot Provider: clinic   I discussed the limitations, risks, security and privacy concerns of performing an evaluation and management service by telephone and the availability of in person appointments. I also discussed with the patient that there may be a patient responsible charge related to this service. The patient expressed understanding and agreed to proceed.   History of Present Illness: Pt with COPD -diagnosis-takes advair twice, albuterol prn Cough and congestion-started last week. Pt states dry cough with occasional clear mucous.  No sneezing.  No n/v/d. No rash.  No loss of taste or smell.  No body aches or fever.  Son with acute URI symptoms-lives next door.     Observations/Objective:  Sat-97% Assessment and Plan: 1. Cough-+COVID Pt states she has wheezing-pt took prednisone and doxycycline-treated for bronchitis last week. Son with symptoms -URI but never tested earlier last week COPD-uses advair/albuterol prn-NO SOB currently. No fever currently Follow Up Instructions: call if symptoms worsening Will refer for additional treatment consideration    I discussed the assessment and treatment plan with the patient. The patient was provided an opportunity to ask questions and all were answered. The patient agreed with the plan and demonstrated an understanding of the instructions.   The patient was advised to call back or seek an in-person evaluation if the symptoms worsen or if the condition fails to improve as anticipated.  I provided 6 minutes of non-face-to-face time during this encounter.   Lori Willis Mat Carne, MD

## 2020-09-18 ENCOUNTER — Ambulatory Visit: Admitting: Cardiovascular Disease

## 2020-10-25 NOTE — Progress Notes (Signed)
Cardiology Office Note:    Date:  10/26/2020   ID:  Lori Primaatti M Flavell, DOB 09-20-1955, MRN [id number]  PCP:  Abigail MiyamotoPerry, Lawrence Edward, MD  [facility] Cardiologist:  Lorine BearsMuhammad Arida, MD  [facility] Electrophysiologist:  None   Referring MD: Abigail MiyamotoPerry, Lawrence Edward,*   Chief Complaint: Irregular HR  History of Present Illness:    Lori Phelps is a 65 y.o. female with a hx of palpitations with nonsustained atrial tachycardia, brief nonsustained Afib, DM2, HTN, HLD, and hypothyroidism who presents with irregular heart rate.   She is followed by Dr. Johney FrameAllred for her arrythmia with her Holter in 2017 being unable to exclude afib. Her symptoms have been controlled by flecainide, diltiazem, and metoprolol. Echo 03/2016 showed an EF of 60-65%, mild LVH, no RMA, G1DD. She was seen several times in 2020 with uncontrolled hypertension that subsequently improved with addition of hydralazine.   Echo in April 2021 which showed normal LV systolic function, indeterminate diastolic function, normal pulmonary pressure and no significant valvular abnormality.   The patient was last seen 05/15/20 and the patient reported chest pain. A lexiscan was ordered that was normal.   Today, the patient reports irregular heart rate per Apple watch. She normally doesn't wear her Apple watch, however on Tuesday, she put it on and it noted heart rhythm was irregular. Heart rate was normal, in the 60s. She wore the Apple watch for a day and said it continued to report abnormal heart rhythm. She denies an symptoms. Denies dizziines, lightheadedness, shortness of breath. No racing of heart. The patient did have some chest discomfort 3 and 4 days ago that was a substernal sharp pain. She thought it was gas but it felt a little different. Chest discomfort was very brief, about 30 seconds, and only up to 2/10. No N/V, diaphoresis, sob with the chest discomfort. She took an extra flecainide, however is unsure if this helped. Denies  lower leg edema, orthopnea, pnd, fever, chills. Rhythm reviewed from Apple watch and it all appears to be SR. EKG today showed SR in the 60s. She does not want a heart monitor.  Patient reports she is doing well with diabetes. She saw a nephrologist 6-8 months ago and said blood work was good. BPs have been normal. She doesn't check BP at home. She takes lasix every other day. Weights have been stable. Weight today 215lbs. She is fasting and is requesting blood work, including A1C.     Past Medical History:  Diagnosis Date  . Atrial tachycardia (HCC)   . Chronic atrial fibrillation (HCC)   . Diabetes mellitus without complication (HCC)   . Essential hypertension   . GERD (gastroesophageal reflux disease)   . HLD (hyperlipidemia)   . HTN (hypertension)   . Hx of bronchitis    uses MDI prn and on advair daily but no dx of asthma   . Hypothyroidism   . Mixed hyperlipidemia   . Overweight   . Palpitations    hx PAC"S  . Phlebitis and thrombophlebitis of deep vessels of lower extremities, left (HCC)   . Type 2 diabetes mellitus with other specified complication [facility](HCC)     Past Surgical History:  Procedure Laterality Date  . BACK SURGERY  1994  . COLONOSCOPY  2002  . DILATATION & CURETTAGE/HYSTEROSCOPY WITH MYOSURE N/A 08/22/2015   Procedure: DILATATION & CURETTAGE/HYSTEROSCOPY WITH MYOSURE AND POLYPECTOMY;  Surgeon: Huel CoteKathy Richardson, MD;  Location: [facility];  Service: Gynecology;  Laterality: N/A;  . NASAL SINUS SURGERY  [id number]    Current Medications: Current Meds  Medication Sig  . Accu-Chek Softclix Lancets lancets Use as instructed  . albuterol (PROVENTIL HFA;VENTOLIN HFA) 108 (90 BASE) MCG/ACT inhaler Inhale 2 puffs into the lungs every 6 (six) hours as needed for wheezing or shortness of breath.  Marland Kitchen aspirin EC 81 MG tablet Take 81 mg by mouth daily.  Marland Kitchen diltiazem (CARDIZEM CD) 240 MG 24 hr capsule Take 1 capsule (240 mg total) by mouth daily.  . flecainide (TAMBOCOR) 50 MG  tablet Take 1 tablet (50 mg total) by mouth 2 (two) times daily. May take an extra tablet for palpitations  . Fluticasone-Salmeterol (ADVAIR DISKUS) 250-50 MCG/DOSE AEPB Inhale 1 puff into the lungs every 12 (twelve) hours.  . furosemide (LASIX) 20 MG tablet Take 1 tablet (20 mg total) by mouth every other day.  Marland Kitchen glucose blood (ACCU-CHEK AVIVA PLUS) test strip Use as instructed  . hydrALAZINE (APRESOLINE) 50 MG tablet Take 1 tablet (50 mg total) by mouth 3 (three) times daily.  Marland Kitchen levothyroxine (SYNTHROID) 25 MCG tablet Take 1 tablet (25 mcg total) by mouth daily.  Marland Kitchen lisinopril (ZESTRIL) 20 MG tablet Take 1 tablet (20 mg total) by mouth 2 (two) times daily.  . metFORMIN (GLUCOPHAGE) 500 MG tablet Take 1 tablet (500 mg total) by mouth 2 (two) times daily with a meal.  . metoprolol tartrate (LOPRESSOR) 25 MG tablet Take 1 tablet (25 mg total) by mouth 2 (two) times daily.  . Multiple Vitamin (MULTIVITAMIN) capsule Take 1 capsule by mouth daily.  . Omega-3 Fatty Acids (FISH OIL PO) Take 2 capsules by mouth daily.  Marland Kitchen omeprazole (PRILOSEC) 20 MG capsule Take 1 capsule (20 mg total) by mouth daily.  . simvastatin (ZOCOR) 20 MG tablet Take 1 tablet (20 mg total) by mouth at bedtime.     Allergies:   Penicillins and Sulfonamide derivatives   Social History   Socioeconomic History  . Marital status: Married    Spouse name: Not on file  . Number of children: 2  . Years of education: Not on file  . Highest education level: Not on file  Occupational History  . Not on file  Tobacco Use  . Smoking status: Former Games developer  . Smokeless tobacco: Never Used  Vaping Use  . Vaping Use: Never used  Substance and Sexual Activity  . Alcohol use: No    Alcohol/week: 0.0 standard drinks  . Drug use: No  . Sexual activity: Not on file  Other Topics Concern  . Not on file  Social History Narrative   Pt lives in [place] with spouse.  Retired Engineer, water for [employer].   Social  Determinants of Health   Financial Resource Strain: Not on file  Food Insecurity: Not on file  Transportation Needs: Not on file  Physical Activity: Not on file  Stress: Not on file  Social Connections: Not on file     Family History: The patient's *family history includes Heart failure in her father; Kidney disease in her mother and another family member. There is no history of Colon cancer, Colon polyps, Esophageal cancer, Rectal cancer, or Stomach cancer.  ROS:   Please see the history of present illness.     All other systems reviewed and are negative.  EKGs/Labs/Other Studies Reviewed:    The following studies were reviewed today: Echo 12/2019 1. Left ventricular ejection fraction, by estimation, is 60 to 65%. The  left ventricle has normal function. The left ventricle  has no regional  wall motion abnormalities. Left ventricular diastolic parameters are  consistent with Grade II diastolic  dysfunction (pseudonormalization).  2. Right ventricular systolic function is normal. The right ventricular  size is normal. There is normal pulmonary artery systolic pressure.  3. Left atrial size was mildly dilated.  4. The mitral valve is normal in structure. No evidence of mitral valve  regurgitation.  5. The aortic valve is tricuspid. Aortic valve regurgitation is not  visualized.  6. The inferior vena cava is normal in size with greater than 50%  respiratory variability, suggesting right atrial pressure of 3 mmHg.   Myoview lexiscan 05/2020   The left ventricular ejection fraction is hyperdynamic (>65%).  Nuclear stress EF: 66%.  There was no ST segment deviation noted during stress.  Defect 1: There is a small defect of mild severity present in the mid anterior and apical anterior location.  The study is normal.  This is a low risk study.   Normal stress nuclear study with probable breast attenuation but no ischemia. Gated ejection fraction 66% with normal wall  motion.    EKG:  EKG is  ordered today.  The ekg ordered today demonstrates SR, 68bpm, low voltage, nonspecific t wave changes  Recent Labs: No results found for requested labs within last 8760 hours.  Recent Lipid Panel No results found for: CHOL, TRIG, HDL, CHOLHDL, VLDL, LDLCALC, LDLDIRECT    Physical Exam:    VS:  BP 128/70 (BP Location: Left Arm, Patient Position: Sitting, Cuff Size: Large)   Pulse 68   Ht 5' (1.524 m)   Wt 215 lb (97.5 kg)   LMP 05/21/2015 Comment: irregular since age 82   SpO2 96%   BMI 41.99 kg/m     Wt Readings from Last 3 Encounters:  10/26/20 215 lb (97.5 kg)  09/05/20 212 lb (96.2 kg)  05/22/20 214 lb (97.1 kg)     GYI:RSWN nourished, well developed in no acute distress HEENT: Normal NECK: No JVD; No carotid bruits LYMPHATICS: No lymphadenopathy CARDIAC:RRR, no murmurs, rubs, gallops RESPIRATORY:  Clear to auscultation without rales, wheezing or rhonchi  ABDOMEN: Soft, non-tender, non-distended MUSCULOSKELETAL:  No edema; No deformity  SKIN: Warm and dry NEUROLOGIC:  Alert and oriented x 3 PSYCHIATRIC:  Normal affect   ASSESSMENT:    1. Essential hypertension   2. Hyperlipidemia, unspecified hyperlipidemia type   3. Chronic kidney disease (CKD), stage II (mild)   4. Atrial tachycardia (HCC)   5. Irregular heart rhythm   6. Chest pain of uncertain etiology   7. Chronic diastolic heart failure (HCC)   8. Diastolic dysfunction    PLAN:    In order of problems listed above:  Irregular heart rhythm by Apple watch. Hx of Palpitations Pt has a history of atach, brief nonsustained afib and PACS/PVCs by heart monitor in 2017. More recently, according to Apple watch, she had an irregular heart rhythm, however patient was completely asymptomatic with a normal heart rates. She took an extra flecanide and is unsure if this helped. Also had fleeting chest discomfort later that night, however sounds more atypical and there is a possibility  this was gas. EKG today shows SR with no ischemic changes. Apple watch strips reviewed and it all appeared to be SR. She is not wanting a heart monitor at this time, she mainly wanted an EKG. If patient becomes symptomatic can consider heart monitor. Continue flecanide, diltiazem and Lopressor. I will check general labs today.   HFpEF Patient  takes lasix every other day. She saw a nephrologist 6-8 months ago who agreed with current lasix dose; labs were reportedly normal. Patient is euvolemic on exam and reports stable weights. Continue BB, ACE, and lasix. CMET today.  HTN BP good, continue current medicaitons  DM2 Patient requesting A1C today, she will follow-up with PCP  HLD Paitent is fasting, check LFTs and lipid panel today. Continue simvastatin  Atypical Chest pain Very brief chest discomfort 4 nights ago. Echo 12/2019 showed LVEF 60-65%, no WMA, G2DD. Recent Myoview was normal, overall low risk. Possible it was noncardiac. No reoccurrence since then. She will continue to monitor symptoms.  Disposition: Follow up in 3 month(s) with Dr. Kirke Corin  Shared Decision Making/Informed Consent        Signed, Quincy Boy David Stall, PA-C  10/26/2020 10:01 AM    [facility]

## 2020-10-26 ENCOUNTER — Other Ambulatory Visit: Payer: Self-pay

## 2020-10-26 ENCOUNTER — Encounter: Payer: Self-pay | Admitting: Physician Assistant

## 2020-10-26 ENCOUNTER — Ambulatory Visit (INDEPENDENT_AMBULATORY_CARE_PROVIDER_SITE_OTHER): Admitting: Medical

## 2020-10-26 VITALS — BP 128/70 | HR 68 | Ht 60.0 in | Wt 215.0 lb

## 2020-10-26 DIAGNOSIS — I471 Supraventricular tachycardia: Secondary | ICD-10-CM | POA: Diagnosis not present

## 2020-10-26 DIAGNOSIS — R079 Chest pain, unspecified: Secondary | ICD-10-CM

## 2020-10-26 DIAGNOSIS — I5032 Chronic diastolic (congestive) heart failure: Secondary | ICD-10-CM

## 2020-10-26 DIAGNOSIS — E785 Hyperlipidemia, unspecified: Secondary | ICD-10-CM

## 2020-10-26 DIAGNOSIS — N182 Chronic kidney disease, stage 2 (mild): Secondary | ICD-10-CM | POA: Diagnosis not present

## 2020-10-26 DIAGNOSIS — I499 Cardiac arrhythmia, unspecified: Secondary | ICD-10-CM

## 2020-10-26 DIAGNOSIS — I10 Essential (primary) hypertension: Secondary | ICD-10-CM | POA: Diagnosis not present

## 2020-10-26 DIAGNOSIS — I5189 Other ill-defined heart diseases: Secondary | ICD-10-CM

## 2020-10-26 NOTE — Patient Instructions (Signed)
Medication Instructions:  Your physician recommends that you continue on your current medications as directed. Please refer to the Current Medication list given to you today.  *If you need a refill on your cardiac medications before your next appointment, please call your pharmacy*  Lab Work: Your physician recommends that you return for lab work in: TODAY - CMET, LIPID, CBC, HGB A1C  If you have labs (blood work) drawn today and your tests are completely normal, you will receive your results only by: Marland Kitchen MyChart Message (if you have MyChart) OR . A paper copy in the mail If you have any lab test that is abnormal or we need to change your treatment, we will call you to review the results.  Testing/Procedures: none  Follow-Up: At [facility], you and your health needs are our priority.  As part of our continuing mission to provide you with exceptional heart care, we have created designated Provider Care Teams.  These Care Teams include your primary Cardiologist (physician) and Advanced Practice Providers (APPs -  Physician Assistants and Nurse Practitioners) who all work together to provide you with the care you need, when you need it.  We recommend signing up for the patient portal called "MyChart".  Sign up information is provided on this After Visit Summary.  MyChart is used to connect with patients for Virtual Visits (Telemedicine).  Patients are able to view lab/test results, encounter notes, upcoming appointments, etc.  Non-urgent messages can be sent to your provider as well.   To learn more about what you can do with MyChart, go to [email or web address].    Your next appointment:   3 month(s)  The format for your next appointment:   In Person  Provider:   You may see Lorine Bears, MD or one of the following Advanced Practice Providers on your designated Care Team:    Nicolasa Ducking, NP  Eula Listen, PA-C  Marisue Ivan, PA-C  Cadence [place], [place]  Gillian Shields,  NP

## 2020-10-27 LAB — COMPREHENSIVE METABOLIC PANEL
ALT: 14 IU/L (ref 0–32)
AST: 17 IU/L (ref 0–40)
Albumin/Globulin Ratio: 1.7 (ref 1.2–2.2)
Albumin: 4.4 g/dL (ref 3.8–4.8)
Alkaline Phosphatase: 71 IU/L (ref 44–121)
BUN/Creatinine Ratio: 17 (ref 12–28)
BUN: 16 mg/dL (ref 8–27)
Bilirubin Total: 0.3 mg/dL (ref 0.0–1.2)
CO2: 20 mmol/L (ref 20–29)
Calcium: 10 mg/dL (ref 8.7–10.3)
Chloride: 105 mmol/L (ref 96–106)
Creatinine, Ser: 0.93 mg/dL (ref 0.57–1.00)
GFR calc Af Amer: 75 mL/min/{1.73_m2} (ref >59)
GFR calc non Af Amer: 65 mL/min/{1.73_m2} (ref >59)
Globulin, Total: 2.6 g/dL (ref 1.5–4.5)
Glucose: 92 mg/dL (ref 65–99)
Potassium: 4.7 mmol/L (ref 3.5–5.2)
Sodium: 139 mmol/L (ref 134–144)
Total Protein: 7 g/dL (ref 6.0–8.5)

## 2020-10-27 LAB — LIPID PANEL
Chol/HDL Ratio: 4.5 ratio — ABNORMAL HIGH (ref 0.0–4.4)
Cholesterol, Total: 161 mg/dL (ref 100–199)
HDL: 36 mg/dL — ABNORMAL LOW (ref >39)
LDL Chol Calc (NIH): 84 mg/dL (ref 0–99)
Triglycerides: 246 mg/dL — ABNORMAL HIGH (ref 0–149)
VLDL Cholesterol Cal: 41 mg/dL — ABNORMAL HIGH (ref 5–40)

## 2020-10-27 LAB — CBC WITH DIFFERENTIAL/PLATELET
Basophils Absolute: 0.1 10*3/uL (ref 0.0–0.2)
Basos: 1 %
EOS (ABSOLUTE): 0.2 10*3/uL (ref 0.0–0.4)
Eos: 2 %
Hematocrit: 39 % (ref 34.0–46.6)
Hemoglobin: 12.9 g/dL (ref 11.1–15.9)
Immature Grans (Abs): 0 10*3/uL (ref 0.0–0.1)
Immature Granulocytes: 0 %
Lymphocytes Absolute: 2.3 10*3/uL (ref 0.7–3.1)
Lymphs: 29 %
MCH: 27.8 pg (ref 26.6–33.0)
MCHC: 33.1 g/dL (ref 31.5–35.7)
MCV: 84 fL (ref 79–97)
Monocytes Absolute: 0.7 10*3/uL (ref 0.1–0.9)
Monocytes: 9 %
Neutrophils Absolute: 4.6 10*3/uL (ref 1.4–7.0)
Neutrophils: 59 %
Platelets: 248 10*3/uL (ref 150–450)
RBC: 4.64 x10E6/uL (ref 3.77–5.28)
RDW: 14.1 % (ref 11.7–15.4)
WBC: 7.9 10*3/uL (ref 3.4–10.8)

## 2020-10-27 LAB — HEMOGLOBIN A1C
Est. average glucose Bld gHb Est-mCnc: 108 mg/dL
Hgb A1c MFr Bld: 5.4 % (ref 4.8–5.6)

## 2020-11-02 ENCOUNTER — Ambulatory Visit: Admitting: Physician Assistant

## 2020-11-06 ENCOUNTER — Ambulatory Visit: Admitting: Cardiovascular Disease

## 2021-01-01 DIAGNOSIS — M25562 Pain in left knee: Secondary | ICD-10-CM | POA: Insufficient documentation

## 2021-01-13 NOTE — Addendum Note (Signed)
Addended byBlane Ohara on: 01/13/2021 01:24 PM   Modules accepted: Level of Service

## 2021-01-24 ENCOUNTER — Other Ambulatory Visit: Payer: Self-pay

## 2021-01-24 ENCOUNTER — Ambulatory Visit (INDEPENDENT_AMBULATORY_CARE_PROVIDER_SITE_OTHER): Admitting: Cardiovascular Disease

## 2021-01-24 ENCOUNTER — Encounter: Payer: Self-pay | Admitting: Cardiovascular Disease

## 2021-01-24 VITALS — BP 120/70 | HR 65 | Ht 60.0 in | Wt 211.1 lb

## 2021-01-24 DIAGNOSIS — R002 Palpitations: Secondary | ICD-10-CM | POA: Diagnosis not present

## 2021-01-24 DIAGNOSIS — E785 Hyperlipidemia, unspecified: Secondary | ICD-10-CM | POA: Diagnosis not present

## 2021-01-24 DIAGNOSIS — I10 Essential (primary) hypertension: Secondary | ICD-10-CM

## 2021-01-24 DIAGNOSIS — E119 Type 2 diabetes mellitus without complications: Secondary | ICD-10-CM

## 2021-01-24 DIAGNOSIS — R011 Cardiac murmur, unspecified: Secondary | ICD-10-CM

## 2021-01-24 NOTE — Progress Notes (Signed)
Cardiology Office Note   Date:  01/24/2021   ID:  Lori Phelps, DOB 1956-05-20, MRN [id number]  PCP:  Abigail Miyamoto, MD  Cardiologist:   Lorine Bears, MD   Chief Complaint  Patient presents with  . Other    3 month f/u pt would like to discuss fluid pill. Meds reviewed verbally with pt.      History of Present Illness: Lori Phelps is a 65 y.o. female who presents for regarding palpitations due to nonsustained atrial tachycardia and brief nonsustained atrial fibrillation.  She has type 2 diabetes, essential hypertension and hypothyroidism. She has been followed by Dr. Johney Frame in the past for arrhythmia.  Symptoms have been controlled with flecainide, diltiazem and metoprolol. Most recent echocardiogram in April 2021 showed normal LV systolic function, indeterminate diastolic function, normal pulmonary pressure and no significant valvular abnormalities.  She had atypical chest pain last year and had a Lexiscan Myoview in September 2021 which showed no evidence of ischemia with normal ejection fraction.  She has been doing well with no recent chest pain, shortness of breath or palpitations.  .  Past Medical History:  Diagnosis Date  . Atrial tachycardia (HCC)   . Chronic atrial fibrillation (HCC)   . Diabetes mellitus without complication (HCC)   . Essential hypertension   . GERD (gastroesophageal reflux disease)   . HLD (hyperlipidemia)   . HTN (hypertension)   . Hx of bronchitis    uses MDI prn and on advair daily but no dx of asthma   . Hypothyroidism   . Mixed hyperlipidemia   . Overweight   . Palpitations    hx PAC"S  . Phlebitis and thrombophlebitis of deep vessels of lower extremities, left (HCC)   . Type 2 diabetes mellitus with other specified complication [facility])     Past Surgical History:  Procedure Laterality Date  . BACK SURGERY  1994  . COLONOSCOPY  2002  . DILATATION & CURETTAGE/HYSTEROSCOPY WITH MYOSURE N/A 08/22/2015   Procedure:  DILATATION & CURETTAGE/HYSTEROSCOPY WITH MYOSURE AND POLYPECTOMY;  Surgeon: Huel Cote, MD;  Location: [facility];  Service: Gynecology;  Laterality: N/A;  . NASAL SINUS SURGERY     [id number]     Current Outpatient Medications  Medication Sig Dispense Refill  . Accu-Chek Softclix Lancets lancets Use as instructed 100 each 12  . albuterol (PROVENTIL HFA;VENTOLIN HFA) 108 (90 BASE) MCG/ACT inhaler Inhale 2 puffs into the lungs every 6 (six) hours as needed for wheezing or shortness of breath.    Marland Kitchen aspirin EC 81 MG tablet Take 81 mg by mouth daily.    Marland Kitchen diltiazem (CARDIZEM CD) 240 MG 24 hr capsule Take 1 capsule (240 mg total) by mouth daily. 90 capsule 3  . flecainide (TAMBOCOR) 50 MG tablet Take 1 tablet (50 mg total) by mouth 2 (two) times daily. May take an extra tablet for palpitations 180 tablet 3  . Fluticasone-Salmeterol (ADVAIR DISKUS) 250-50 MCG/DOSE AEPB Inhale 1 puff into the lungs every 12 (twelve) hours. 60 each 6  . furosemide (LASIX) 20 MG tablet Take 1 tablet (20 mg total) by mouth every other day. 45 tablet 3  . glucose blood (ACCU-CHEK AVIVA PLUS) test strip Use as instructed 100 each 12  . hydrALAZINE (APRESOLINE) 50 MG tablet Take 1 tablet (50 mg total) by mouth 3 (three) times daily. 270 tablet 3  . levothyroxine (SYNTHROID) 25 MCG tablet Take 1 tablet (25 mcg total) by mouth daily. 90 tablet 2  . lisinopril (  ZESTRIL) 20 MG tablet Take 1 tablet (20 mg total) by mouth 2 (two) times daily. 180 tablet 2  . metFORMIN (GLUCOPHAGE) 500 MG tablet Take 1 tablet (500 mg total) by mouth 2 (two) times daily with a meal. 180 tablet 2  . metoprolol tartrate (LOPRESSOR) 25 MG tablet Take 1 tablet (25 mg total) by mouth 2 (two) times daily. 180 tablet 3  . Multiple Vitamin (MULTIVITAMIN) capsule Take 1 capsule by mouth daily.    . Omega-3 Fatty Acids (FISH OIL PO) Take 2 capsules by mouth daily.    Marland Kitchen omeprazole (PRILOSEC) 20 MG capsule Take 1 capsule (20 mg total) by mouth daily. 90  capsule 2  . simvastatin (ZOCOR) 20 MG tablet Take 1 tablet (20 mg total) by mouth at bedtime. 90 tablet 3   No current facility-administered medications for this visit.    Allergies:   Penicillins and Sulfonamide derivatives    Social History:  The patient  reports that she has quit smoking. She has never used smokeless tobacco. She reports that she does not drink alcohol and does not use drugs.   Family History:  The patient's family history includes Heart failure in her father; Kidney disease in her mother and another family member.    ROS:  Please see the history of present illness.   Otherwise, review of systems are positive for none.   All other systems are reviewed and negative.    PHYSICAL EXAM: VS:  BP 120/70 (BP Location: Left Arm, Patient Position: Sitting, Cuff Size: Large)   Pulse 65   Ht 5' (1.524 m)   Wt 211 lb 2 oz (95.8 kg)   LMP 05/21/2015 Comment: irregular since age 44   SpO2 98%   BMI 41.23 kg/m  , BMI Body mass index is 41.23 kg/m. GEN: Well nourished, well developed, in no acute distress  HEENT: normal  Neck: no JVD, carotid bruits, or masses Cardiac: RRR; no  rubs, or gallops,no edema .  2 out of 6 systolic murmur in the aortic area which is early peaking Respiratory:  clear to auscultation bilaterally, normal work of breathing GI: soft, nontender, nondistended, + BS MS: no deformity or atrophy  Skin: warm and dry, no rash Neuro:  Strength and sensation are intact Psych: euthymic mood, full affect   EKG:  EKG is not ordered today. EKG from February showed sinus rhythm with no significant ischemic changes.  Low voltage   Recent Labs: 10/26/2020: ALT 14; BUN 16; Creatinine, Ser 0.93; Hemoglobin 12.9; Platelets 248; Potassium 4.7; Sodium 139    Lipid Panel    Component Value Date/Time   CHOL 161 10/26/2020 1000   TRIG 246 (H) 10/26/2020 1000   HDL 36 (L) 10/26/2020 1000   CHOLHDL 4.5 (H) 10/26/2020 1000   LDLCALC 84 10/26/2020 1000      Wt  Readings from Last 3 Encounters:  01/24/21 211 lb 2 oz (95.8 kg)  10/26/20 215 lb (97.5 kg)  09/05/20 212 lb (96.2 kg)       ASSESSMENT AND PLAN:  1.  Palpitations due to PACs, nonsustained atrial tachycardia and nonsustained atrial fibrillation:  Symptoms are well controlled on current medications including flecainide, metoprolol and diltiazem.  These were refilled.  2. Essential hypertension: Blood pressure is well controlled on current medication.  3. Diabetes mellitus:  managed by primary care physician.  4.  Hyperlipidemia: Currently on simvastatin.  Recommend a target LDL of less than 70 given that she is diabetic.  5.  Cardiac  murmur: She has a murmur suggestive of aortic sclerosis.  Echocardiogram last year showed no significant aortic stenosis.   Disposition:   Follow-up in 6 months.  Signed,  Lorine Bears, MD  01/24/2021 9:20 AM    [facility]

## 2021-01-24 NOTE — Patient Instructions (Signed)
Medication Instructions:  Your physician recommends that you continue on your current medications as directed. Please refer to the Current Medication list given to you today.  *If you need a refill on your cardiac medications before your next appointment, please call your pharmacy*   Lab Work: None ordered If you have labs (blood work) drawn today and your tests are completely normal, you will receive your results only by: Marland Kitchen MyChart Message (if you have MyChart) OR . A paper copy in the mail If you have any lab test that is abnormal or we need to change your treatment, we will call you to review the results.   Testing/Procedures: None ordeerd   Follow-Up: At [facility], you and your health needs are our priority.  As part of our continuing mission to provide you with exceptional heart care, we have created designated Provider Care Teams.  These Care Teams include your primary Cardiologist (physician) and Advanced Practice Providers (APPs -  Physician Assistants and Nurse Practitioners) who all work together to provide you with the care you need, when you need it.  We recommend signing up for the patient portal called "MyChart".  Sign up information is provided on this After Visit Summary.  MyChart is used to connect with patients for Virtual Visits (Telemedicine).  Patients are able to view lab/test results, encounter notes, upcoming appointments, etc.  Non-urgent messages can be sent to your provider as well.   To learn more about what you can do with MyChart, go to [email or web address].    Your next appointment:   Your physician wants you to follow-up in: 6 months You will receive a reminder letter in the mail two months in advance. If you don't receive a letter, please call our office to schedule the follow-up appointment.   The format for your next appointment:   In Person  Provider:   You may see Lorine Bears, MD or one of the following Advanced Practice Providers on  your designated Care Team:    Nicolasa Ducking, NP  Eula Listen, PA-C  Marisue Ivan, PA-C  Cadence [place], [place]  Gillian Shields, NP    Other Instructions N/A

## 2021-03-06 LAB — HM DIABETES EYE EXAM

## 2021-03-27 ENCOUNTER — Encounter: Payer: Self-pay | Admitting: Legal Medicine

## 2021-05-15 ENCOUNTER — Other Ambulatory Visit

## 2021-05-16 ENCOUNTER — Other Ambulatory Visit

## 2021-05-16 DIAGNOSIS — E782 Mixed hyperlipidemia: Secondary | ICD-10-CM

## 2021-05-16 DIAGNOSIS — E1169 Type 2 diabetes mellitus with other specified complication: Secondary | ICD-10-CM

## 2021-05-16 DIAGNOSIS — E038 Other specified hypothyroidism: Secondary | ICD-10-CM

## 2021-05-16 DIAGNOSIS — I10 Essential (primary) hypertension: Secondary | ICD-10-CM

## 2021-05-17 ENCOUNTER — Encounter: Payer: Self-pay | Admitting: Legal Medicine

## 2021-05-17 ENCOUNTER — Ambulatory Visit (INDEPENDENT_AMBULATORY_CARE_PROVIDER_SITE_OTHER): Admitting: Legal Medicine

## 2021-05-17 ENCOUNTER — Other Ambulatory Visit: Payer: Self-pay | Admitting: Legal Medicine

## 2021-05-17 ENCOUNTER — Other Ambulatory Visit: Payer: Self-pay

## 2021-05-17 VITALS — BP 110/86 | HR 60 | Temp 97.3°F | Ht 60.0 in | Wt 212.6 lb

## 2021-05-17 DIAGNOSIS — I10 Essential (primary) hypertension: Secondary | ICD-10-CM | POA: Diagnosis not present

## 2021-05-17 DIAGNOSIS — E782 Mixed hyperlipidemia: Secondary | ICD-10-CM | POA: Diagnosis not present

## 2021-05-17 DIAGNOSIS — J449 Chronic obstructive pulmonary disease, unspecified: Secondary | ICD-10-CM

## 2021-05-17 DIAGNOSIS — E1169 Type 2 diabetes mellitus with other specified complication: Secondary | ICD-10-CM | POA: Diagnosis not present

## 2021-05-17 DIAGNOSIS — K2100 Gastro-esophageal reflux disease with esophagitis, without bleeding: Secondary | ICD-10-CM

## 2021-05-17 DIAGNOSIS — E038 Other specified hypothyroidism: Secondary | ICD-10-CM

## 2021-05-17 LAB — TSH: TSH: 2.74 u[IU]/mL (ref 0.450–4.500)

## 2021-05-17 LAB — CBC WITH DIFFERENTIAL/PLATELET
Basophils Absolute: 0.1 10*3/uL (ref 0.0–0.2)
Basos: 1 %
EOS (ABSOLUTE): 0.2 10*3/uL (ref 0.0–0.4)
Eos: 3 %
Hematocrit: 39 % (ref 34.0–46.6)
Hemoglobin: 12.5 g/dL (ref 11.1–15.9)
Immature Grans (Abs): 0 10*3/uL (ref 0.0–0.1)
Immature Granulocytes: 0 %
Lymphocytes Absolute: 2.6 10*3/uL (ref 0.7–3.1)
Lymphs: 36 %
MCH: 28 pg (ref 26.6–33.0)
MCHC: 32.1 g/dL (ref 31.5–35.7)
MCV: 87 fL (ref 79–97)
Monocytes Absolute: 0.7 10*3/uL (ref 0.1–0.9)
Monocytes: 10 %
Neutrophils Absolute: 3.7 10*3/uL (ref 1.4–7.0)
Neutrophils: 50 %
Platelets: 227 10*3/uL (ref 150–450)
RBC: 4.47 x10E6/uL (ref 3.77–5.28)
RDW: 13.1 % (ref 11.7–15.4)
WBC: 7.2 10*3/uL (ref 3.4–10.8)

## 2021-05-17 LAB — COMPREHENSIVE METABOLIC PANEL
ALT: 14 IU/L (ref 0–32)
AST: 14 IU/L (ref 0–40)
Albumin/Globulin Ratio: 2 (ref 1.2–2.2)
Albumin: 4.4 g/dL (ref 3.8–4.8)
Alkaline Phosphatase: 73 IU/L (ref 44–121)
BUN/Creatinine Ratio: 16 (ref 12–28)
BUN: 16 mg/dL (ref 8–27)
Bilirubin Total: 0.2 mg/dL (ref 0.0–1.2)
CO2: 20 mmol/L (ref 20–29)
Calcium: 10.2 mg/dL (ref 8.7–10.3)
Chloride: 106 mmol/L (ref 96–106)
Creatinine, Ser: 1.02 mg/dL — ABNORMAL HIGH (ref 0.57–1.00)
Globulin, Total: 2.2 g/dL (ref 1.5–4.5)
Glucose: 91 mg/dL (ref 65–99)
Potassium: 4.8 mmol/L (ref 3.5–5.2)
Sodium: 141 mmol/L (ref 134–144)
Total Protein: 6.6 g/dL (ref 6.0–8.5)
eGFR: 61 mL/min/{1.73_m2} (ref >59)

## 2021-05-17 LAB — HEMOGLOBIN A1C
Est. average glucose Bld gHb Est-mCnc: 120 mg/dL
Hgb A1c MFr Bld: 5.8 % — ABNORMAL HIGH (ref 4.8–5.6)

## 2021-05-17 LAB — LIPID PANEL
Chol/HDL Ratio: 4 ratio (ref 0.0–4.4)
Cholesterol, Total: 166 mg/dL (ref 100–199)
HDL: 42 mg/dL (ref >39)
LDL Chol Calc (NIH): 88 mg/dL (ref 0–99)
Triglycerides: 212 mg/dL — ABNORMAL HIGH (ref 0–149)
VLDL Cholesterol Cal: 36 mg/dL (ref 5–40)

## 2021-05-17 LAB — CARDIOVASCULAR RISK ASSESSMENT

## 2021-05-17 MED ORDER — FLUTICASONE-SALMETEROL 250-50 MCG/ACT IN AEPB: 1.0000 | INHALATION_SPRAY | Freq: Two times a day (BID) | RESPIRATORY_TRACT | 2 refills | Status: DC

## 2021-05-17 MED ORDER — ALBUTEROL SULFATE HFA 108 (90 BASE) MCG/ACT IN AERS: 2.0000 | INHALATION_SPRAY | Freq: Four times a day (QID) | RESPIRATORY_TRACT | 3 refills | Status: DC | PRN

## 2021-05-17 MED ORDER — LEVOTHYROXINE SODIUM 25 MCG PO TABS: 25.0000 ug | ORAL_TABLET | Freq: Every day | ORAL | 2 refills | Status: DC

## 2021-05-17 MED ORDER — FUROSEMIDE 20 MG PO TABS: 20.0000 mg | ORAL_TABLET | ORAL | 0 refills | Status: DC

## 2021-05-17 MED ORDER — METFORMIN HCL 500 MG PO TABS: 500.0000 mg | ORAL_TABLET | Freq: Two times a day (BID) | ORAL | 2 refills | Status: DC

## 2021-05-17 MED ORDER — SIMVASTATIN 20 MG PO TABS: 20.0000 mg | ORAL_TABLET | Freq: Every day | ORAL | 3 refills | Status: DC

## 2021-05-17 MED ORDER — FUROSEMIDE 20 MG PO TABS: 20.0000 mg | ORAL_TABLET | ORAL | 3 refills | Status: DC

## 2021-05-17 MED ORDER — ESOMEPRAZOLE MAGNESIUM 40 MG PO CPDR: 40.0000 mg | DELAYED_RELEASE_CAPSULE | Freq: Every day | ORAL | 1 refills | Status: DC

## 2021-05-17 MED ORDER — GLUCOSE BLOOD VI STRP: ORAL_STRIP | 12 refills | Status: DC

## 2021-05-17 MED ORDER — METFORMIN HCL 500 MG PO TABS: 500.0000 mg | ORAL_TABLET | Freq: Two times a day (BID) | ORAL | 1 refills | Status: DC

## 2021-05-17 NOTE — Progress Notes (Signed)
Established Patient Office Visit  Subjective:  Patient ID: Lori Phelps, female    DOB: [birth year]  Age: 65 y.o. MRN: [id number]  CC:  Chief Complaint  Patient presents with   Hyperlipidemia   Hypertension   Diabetes   Hypothyroidism    HPI [name] presents for chronic visit  Patient present with type 2 diabetes.  Specifically, this is type 2, noninsulin requiring diabetes, complicated by hypertension, hypercholesterolemia.  Compliance with treatment has been good; patient take medicines as directed, maintains diet and exercise regimen, follows up as directed, and is keeping glucose diary.  Date of  diagnosis 2010.  Depression screen has been performed.Tobacco screen nonsmoker. Current medicines for diabetes metformin.  Patient is on lisinopril for renal protection and simvastatin for cholesterol control.  Patient performs foot exams daily and last ophthalmologic exam was yes.   Patient presents for follow up of hypertension.  Patient tolerating metoprolol, lisinopril well with side effects.  Patient was diagnosed with hypertension 2010 so has been treated for hypertension for 10 years.Patient is working on maintaining diet and exercise regimen and follows up as directed. Complication include none.   Patient presents with hyperlipidemia.  Compliance with treatment has been good; patient takes medicines as directed, maintains low cholesterol diet, follows up as directed, and maintains exercise regimen.  Patient is using simvastatin without problems.   Patient has HYPOTHYROIDISM.  Diagnosed 10 years ago.  Patient has stable thyroid readings.  Patient is having none.  Last TSH was normal.  continue dosage of thyroid medicine.   Past Medical History:  Diagnosis Date   Atrial tachycardia ([place])    Chronic atrial fibrillation (HCC)    Diabetes mellitus without complication (HCC)    Essential hypertension    GERD (gastroesophageal reflux disease)    HLD (hyperlipidemia)    HTN  (hypertension)    Hx of bronchitis    uses MDI prn and on advair daily but no dx of asthma    Hypothyroidism    Mixed hyperlipidemia    Overweight    Palpitations    hx PAC"S   Phlebitis and thrombophlebitis of deep vessels of lower extremities, left ([place])    Type 2 diabetes mellitus with other specified complication [facility])     Past Surgical History:  Procedure Laterality Date   BACK SURGERY  1994   COLONOSCOPY  2002   DILATATION & CURETTAGE/HYSTEROSCOPY WITH MYOSURE N/A 08/22/2015   Procedure: DILATATION & CURETTAGE/HYSTEROSCOPY WITH MYOSURE AND POLYPECTOMY;  Surgeon: Paula Compton, MD;  Location: [facility];  Service: Gynecology;  Laterality: N/A;   NASAL SINUS SURGERY     [id number]    Family History  Problem Relation Age of Onset   Kidney disease Other    Kidney disease Mother    Heart failure Father    Colon cancer Neg Hx    Colon polyps Neg Hx    Esophageal cancer Neg Hx    Rectal cancer Neg Hx    Stomach cancer Neg Hx     Social History   Socioeconomic History   Marital status: Married    Spouse name: Not on file   Number of children: 2   Years of education: Not on file   Highest education level: Not on file  Occupational History   Not on file  Tobacco Use   Smoking status: Former   Smokeless tobacco: Never  Vaping Use   Vaping Use: Never used  Substance and Sexual Activity   Alcohol use: No  Alcohol/week: 0.0 standard drinks   Drug use: No   Sexual activity: Not on file  Other Topics Concern   Not on file  Social History Narrative   Pt lives in [place] with spouse.  Retired Landscape architect for [employer].   Social Determinants of Health   Financial Resource Strain: Not on file  Food Insecurity: Not on file  Transportation Needs: Not on file  Physical Activity: Not on file  Stress: Not on file  Social Connections: Not on file  Intimate Partner Violence: Not on file    Outpatient Medications Prior to Visit  Medication Sig  Dispense Refill   Accu-Chek Softclix Lancets lancets Use as instructed 100 each 12   aspirin EC 81 MG tablet Take 81 mg by mouth daily.     diltiazem (CARDIZEM CD) 240 MG 24 hr capsule Take 1 capsule (240 mg total) by mouth daily. 90 capsule 3   flecainide (TAMBOCOR) 50 MG tablet Take 1 tablet (50 mg total) by mouth 2 (two) times daily. May take an extra tablet for palpitations 180 tablet 3   glucose blood (ACCU-CHEK AVIVA PLUS) test strip Use as instructed 100 each 12   lisinopril (ZESTRIL) 20 MG tablet Take 1 tablet (20 mg total) by mouth 2 (two) times daily. 180 tablet 2   metoprolol tartrate (LOPRESSOR) 25 MG tablet Take 1 tablet (25 mg total) by mouth 2 (two) times daily. 180 tablet 3   Multiple Vitamin (MULTIVITAMIN) capsule Take 1 capsule by mouth daily.     Omega-3 Fatty Acids (FISH OIL PO) Take 2 capsules by mouth daily.     omeprazole (PRILOSEC) 20 MG capsule Take 1 capsule (20 mg total) by mouth daily. 90 capsule 2   albuterol (PROVENTIL HFA;VENTOLIN HFA) 108 (90 BASE) MCG/ACT inhaler Inhale 2 puffs into the lungs every 6 (six) hours as needed for wheezing or shortness of breath.     Fluticasone-Salmeterol (ADVAIR DISKUS) 250-50 MCG/DOSE AEPB Inhale 1 puff into the lungs every 12 (twelve) hours. 60 each 6   levothyroxine (SYNTHROID) 25 MCG tablet Take 1 tablet (25 mcg total) by mouth daily. 90 tablet 2   metFORMIN (GLUCOPHAGE) 500 MG tablet Take 1 tablet (500 mg total) by mouth 2 (two) times daily with a meal. 180 tablet 2   simvastatin (ZOCOR) 20 MG tablet Take 1 tablet (20 mg total) by mouth at bedtime. 90 tablet 3   hydrALAZINE (APRESOLINE) 50 MG tablet Take 1 tablet (50 mg total) by mouth 3 (three) times daily. 270 tablet 3   furosemide (LASIX) 20 MG tablet Take 1 tablet (20 mg total) by mouth every other day. 45 tablet 3   No facility-administered medications prior to visit.    Allergies  Allergen Reactions   Penicillins Hives    Has patient had a PCN reaction causing  immediate rash, facial/tongue/throat swelling, SOB or lightheadedness with hypotension: No Has patient had a PCN reaction causing severe rash involving mucus membranes or skin necrosis: No Has patient had a PCN reaction that required hospitalization No Has patient had a PCN reaction occurring within the last 10 years: No If all of the above answers are "NO", then may proceed with Cephalosporin use.    Sulfonamide Derivatives Rash    ROS Review of Systems  Constitutional:  Negative for activity change and appetite change.  HENT:  Negative for congestion.   Eyes:  Negative for visual disturbance.  Respiratory:  Negative for chest tightness and shortness of breath.  Cardiovascular:  Negative for chest pain and palpitations.  Gastrointestinal:  Negative for abdominal pain and anal bleeding.  Genitourinary:  Negative for difficulty urinating and dysuria.  Musculoskeletal:  Negative for arthralgias and back pain.  Neurological: Negative.   Psychiatric/Behavioral: Negative.       Objective:    Physical Exam Vitals reviewed.  Constitutional:      General: She is not in acute distress.    Appearance: Normal appearance.  HENT:     Head: Normocephalic and atraumatic.     Right Ear: Tympanic membrane, ear canal and external ear normal.     Left Ear: Tympanic membrane, ear canal and external ear normal.     Mouth/Throat:     Mouth: Mucous membranes are dry.     Pharynx: Oropharynx is clear.  Eyes:     Extraocular Movements: Extraocular movements intact.     Conjunctiva/sclera: Conjunctivae normal.     Pupils: Pupils are equal, round, and reactive to light.  Cardiovascular:     Rate and Rhythm: Normal rate and regular rhythm.     Pulses: Normal pulses.     Heart sounds: No murmur heard.   No gallop.  Pulmonary:     Effort: Pulmonary effort is normal. No respiratory distress.     Breath sounds: No wheezing.  Abdominal:     General: Abdomen is flat. Bowel sounds are normal. There  is no distension.     Palpations: Abdomen is soft.     Tenderness: There is no abdominal tenderness.  Musculoskeletal:        General: Normal range of motion.     Cervical back: Normal range of motion.  Skin:    General: Skin is warm.     Capillary Refill: Capillary refill takes less than 2 seconds.  Neurological:     General: No focal deficit present.     Mental Status: She is alert and oriented to person, place, and time. Mental status is at baseline.  Psychiatric:        Mood and Affect: Mood normal.        Thought Content: Thought content normal.    BP 110/86   Pulse 60   Temp (!) 97.3 F (36.3 C)   Ht 5' (1.524 m)   Wt 212 lb 9.6 oz (96.4 kg)   LMP 05/21/2015 Comment: irregular since age 60   SpO2 97%   BMI 41.52 kg/m  Wt Readings from Last 3 Encounters:  05/17/21 212 lb 9.6 oz (96.4 kg)  01/24/21 211 lb 2 oz (95.8 kg)  10/26/20 215 lb (97.5 kg)     Health Maintenance Due  Topic Date Due   Pneumococcal Vaccine 47-20 Years old (1 - PCV) Never done   FOOT EXAM  Never done   HIV Screening  Never done   Hepatitis C Screening  Never done   PAP SMEAR-Modifier  Never done   Zoster Vaccines- Shingrix (1 of 2) Never done   MAMMOGRAM  10/05/2020   INFLUENZA VACCINE  04/08/2021    There are no preventive care reminders to display for this patient.  Lab Results  Component Value Date   TSH 2.740 05/16/2021   Lab Results  Component Value Date   WBC 7.2 05/16/2021   HGB 12.5 05/16/2021   HCT 39.0 05/16/2021   MCV 87 05/16/2021   PLT 227 05/16/2021   Lab Results  Component Value Date   NA 141 05/16/2021   K 4.8 05/16/2021   CO2 20 05/16/2021  GLUCOSE 91 05/16/2021   BUN 16 05/16/2021   CREATININE 1.02 (H) 05/16/2021   BILITOT 0.2 05/16/2021   ALKPHOS 73 05/16/2021   AST 14 05/16/2021   ALT 14 05/16/2021   PROT 6.6 05/16/2021   ALBUMIN 4.4 05/16/2021   CALCIUM 10.2 05/16/2021   ANIONGAP 11 08/16/2015   EGFR 61 05/16/2021   Lab Results  Component  Value Date   CHOL 166 05/16/2021   Lab Results  Component Value Date   HDL 42 05/16/2021   Lab Results  Component Value Date   LDLCALC 88 05/16/2021   Lab Results  Component Value Date   TRIG 212 (H) 05/16/2021   Lab Results  Component Value Date   CHOLHDL 4.0 05/16/2021   Lab Results  Component Value Date   HGBA1C 5.8 (H) 05/16/2021      Assessment & Plan:   Problem List Items Addressed This Visit       Cardiovascular and Mediastinum   Essential hypertension - Primary   Relevant Medications   simvastatin (ZOCOR) 20 MG tablet An individual hypertension care plan was established and reinforced today.  The patient's status was assessed using clinical findings on exam and labs or diagnostic tests. The patient's success at meeting treatment goals on disease specific evidence-based guidelines and found to be well controlled. SELF MANAGEMENT: The patient and I together assessed ways to personally work towards obtaining the recommended goals. RECOMMENDATIONS: avoid decongestants found in common cold remedies, decrease consumption of alcohol, perform routine monitoring of BP with home BP cuff, exercise, reduction of dietary salt, take medicines as prescribed, try not to miss doses and quit smoking.  Regular exercise and maintaining a healthy weight is needed.  Stress reduction may help. A CLINICAL SUMMARY including written plan identify barriers to care unique to individual due to social or financial issues.  We attempt to mutually creat solutions for individual and family understanding.      Endocrine   Type 2 diabetes mellitus with other specified complication (HCC)   Relevant Medications   simvastatin (ZOCOR) 20 MG tablet   glucose blood test strip An individual care plan for diabetes was established and reinforced today.  The patient's status was assessed using clinical findings on exam, labs and diagnostic testing. Patient success at meeting goals based on disease specific  evidence-based guidelines and found to be good controlled. Medications were assessed and patient's understanding of the medical issues , including barriers were assessed. Recommend adherence to a diabetic diet, a graduated exercise program, HgbA1c level is checked quarterly, and urine microalbumin performed yearly .  Annual mono-filament sensation testing performed. Lower blood pressure and control hyperlipidemia is important. Get annual eye exams and annual flu shots and smoking cessation discussed.  Self management goals were discussed.    Hypothyroidism   Relevant Medications   levothyroxine (SYNTHROID) 25 MCG tablet Patient is known to have hypothyroidism and is on treatment with levothyroid 25 mcg.  Patient was diagnosed 10 years ago.  Other treatment includes none.  Patient is compliant with medicines and last TSH 6 months ago.  Last TSH was normal.      Other   Mixed hyperlipidemia   Relevant Medications   simvastatin (ZOCOR) 20 MG tablet AN INDIVIDUAL CARE PLAN for hyperlipidemia/ cholesterol was established and reinforced today.  The patient's status was assessed using clinical findings on exam, lab and other diagnostic tests. The patient's disease status was assessed based on evidence-based guidelines and found to be fair controlled. MEDICATIONS were reviewed. SELF  MANAGEMENT GOALS have been discussed and patient's success at attaining the goal of low cholesterol was assessed. RECOMMENDATION given include regular exercise 3 days a week and low cholesterol/low fat diet. CLINICAL SUMMARY including written plan to identify barriers unique to the patient due to social or economic  reasons was discussed.    Other Visit Diagnoses     Gastroesophageal reflux disease with esophagitis without hemorrhage       Relevant Medications   esomeprazole (NEXIUM) 40 MG capsule Plan of care was formulated today.  She is doing well.  A plan of care was formulated using patient exam, tests and other  sources to optimize care using evidence based information.  Recommend no smoking, no eating after supper, avoid fatty foods, elevate Head of bed, avoid tight fitting clothing.  Continue on OTC.    Obstructive chronic bronchitis without exacerbation (HCC)       Relevant Medications   fluticasone-salmeterol (WIXELA INHUB) 250-50 MCG/ACT AEPB   albuterol (VENTOLIN HFA) 108 (90 Base) MCG/ACT inhaler An individualize plan was formulated for care of COPD.  Treatment is evidence based.  She will continue on inhalers, avoid smoking and smoke.  Regular exercise with help with dyspnea. Routine follow ups and medication compliance is needed.       30 minute visit with review of records   Follow-up: Return in about 4 months (around 09/16/2021) for fasting.    Reinaldo Meeker, MD

## 2021-05-17 NOTE — Progress Notes (Signed)
Kidney and liver tests normal, A1c 5.8, triglycerides high, TSH normal, cbc normal lp

## 2021-05-21 ENCOUNTER — Encounter: Payer: Self-pay | Admitting: Nurse Practitioner

## 2021-05-21 ENCOUNTER — Ambulatory Visit (INDEPENDENT_AMBULATORY_CARE_PROVIDER_SITE_OTHER): Admitting: Nurse Practitioner

## 2021-05-21 ENCOUNTER — Other Ambulatory Visit: Payer: Self-pay | Admitting: Nurse Practitioner

## 2021-05-21 VITALS — BP 132/72 | HR 72 | Temp 97.0°F | Ht 60.0 in | Wt 211.0 lb

## 2021-05-21 DIAGNOSIS — H65192 Other acute nonsuppurative otitis media, left ear: Secondary | ICD-10-CM

## 2021-05-21 DIAGNOSIS — J441 Chronic obstructive pulmonary disease with (acute) exacerbation: Secondary | ICD-10-CM | POA: Diagnosis not present

## 2021-05-21 DIAGNOSIS — R059 Cough, unspecified: Secondary | ICD-10-CM | POA: Diagnosis not present

## 2021-05-21 MED ORDER — ALBUTEROL SULFATE (2.5 MG/3ML) 0.083% IN NEBU: 2.5000 mg | INHALATION_SOLUTION | Freq: Four times a day (QID) | RESPIRATORY_TRACT | 1 refills | Status: DC | PRN

## 2021-05-21 MED ORDER — TRIAMCINOLONE ACETONIDE 40 MG/ML IJ SUSP
40.0000 mg | Freq: Once | INTRAMUSCULAR | Status: AC
  Administered 2021-05-21: 40 mg via INTRAMUSCULAR

## 2021-05-21 MED ORDER — AZITHROMYCIN 250 MG PO TABS: ORAL_TABLET | ORAL | 0 refills | Status: AC

## 2021-05-21 MED ORDER — PROMETHAZINE-DM 6.25-15 MG/5ML PO SYRP: 5.0000 mL | ORAL_SOLUTION | Freq: Four times a day (QID) | ORAL | 0 refills | Status: DC | PRN

## 2021-05-21 MED ORDER — FLUTICASONE PROPIONATE 50 MCG/ACT NA SUSP: 2.0000 | Freq: Every day | NASAL | 6 refills | Status: DC

## 2021-05-21 MED ORDER — ALBUTEROL SULFATE HFA 108 (90 BASE) MCG/ACT IN AERS: 2.0000 | INHALATION_SPRAY | Freq: Four times a day (QID) | RESPIRATORY_TRACT | 2 refills | Status: DC | PRN

## 2021-05-21 NOTE — Addendum Note (Signed)
Addended by: Janie Morning on: 05/21/2021 04:45 PM   Modules accepted: Level of Service

## 2021-05-21 NOTE — Progress Notes (Signed)
 Acute Office Visit  Subjective:    Patient ID: Lori Phelps, female    DOB: 03/12/1956, 65 y.o.   MRN: [id number]  Chief Complaint  Patient presents with   Cough        COUGH  Patient is in today for non-productive cough and wheezing.She tells me she has a past medical history of COPD. She is a former cigarette smoker, quit 1995. She denies dyspnea or fever. She does take Lisinopril for hypertension, prescribed for over twenty years per pt.   Past Medical History:  Diagnosis Date   Atrial tachycardia (HCC)    Chronic atrial fibrillation (HCC)    Diabetes mellitus without complication (HCC)    Essential hypertension    GERD (gastroesophageal reflux disease)    HLD (hyperlipidemia)    HTN (hypertension)    Hx of bronchitis    uses MDI prn and on advair daily but no dx of asthma    Hypothyroidism    Mixed hyperlipidemia    Overweight    Palpitations    hx PAC"S   Phlebitis and thrombophlebitis of deep vessels of lower extremities, left (HCC)    Type 2 diabetes mellitus with other specified complication (HCC)     Past Surgical History:  Procedure Laterality Date   BACK SURGERY  1994   COLONOSCOPY  2002   DILATATION & CURETTAGE/HYSTEROSCOPY WITH MYOSURE N/A 08/22/2015   Procedure: DILATATION & CURETTAGE/HYSTEROSCOPY WITH MYOSURE AND POLYPECTOMY;  Surgeon: Kathy Richardson, MD;  Location: [facility];  Service: Gynecology;  Laterality: N/A;   NASAL SINUS SURGERY     1978,1981    Family History  Problem Relation Age of Onset   Kidney disease Other    Kidney disease Mother    Heart failure Father    Colon cancer Neg Hx    Colon polyps Neg Hx    Esophageal cancer Neg Hx    Rectal cancer Neg Hx    Stomach cancer Neg Hx     Social History   Socioeconomic History   Marital status: Married    Spouse name: Not on file   Number of children: 2   Years of education: Not on file   Highest education level: Not on file  Occupational History   Not on file  Tobacco Use    Smoking status: Former   Smokeless tobacco: Never  Vaping Use   Vaping Use: Never used  Substance and Sexual Activity   Alcohol use: No    Alcohol/week: 0.0 standard drinks   Drug use: No   Sexual activity: Not on file  Other Topics Concern   Not on file  Social History Narrative   Pt lives in [place] with spouse.  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   Social Determinants of Health   Financial Resource Strain: Not on file  Food Insecurity: Not on file  Transportation Needs: Not on file  Physical Activity: Not on file  Stress: Not on file  Social Connections: Not on file  Intimate Partner Violence: Not on file    Outpatient Medications Prior to Visit  Medication Sig Dispense Refill   Accu-Chek Softclix Lancets lancets Use as instructed 100 each 12   albuterol (VENTOLIN HFA) 108 (90 Base) MCG/ACT inhaler Inhale 2 puffs into the lungs every 6 (six) hours as needed for wheezing or shortness of breath. 18 g 3   aspirin EC 81 MG tablet Take 81 mg by mouth daily.     diltiazem (CARDIZEM CD) 240   MG 24 hr capsule Take 1 capsule (240 mg total) by mouth daily. 90 capsule 3   esomeprazole (NEXIUM) 40 MG capsule Take 1 capsule (40 mg total) by mouth daily. 90 capsule 1   flecainide (TAMBOCOR) 50 MG tablet Take 1 tablet (50 mg total) by mouth 2 (two) times daily. May take an extra tablet for palpitations 180 tablet 3   fluticasone-salmeterol (WIXELA INHUB) 250-50 MCG/ACT AEPB Inhale 1 puff into the lungs in the morning and at bedtime. 180 each 2   furosemide (LASIX) 20 MG tablet Take 1 tablet (20 mg total) by mouth every other day. 45 tablet 1   glucose blood (ACCU-CHEK AVIVA PLUS) test strip Use as instructed 100 each 12   glucose blood test strip Use as instructed 100 each 12   levothyroxine (SYNTHROID) 25 MCG tablet Take 1 tablet (25 mcg total) by mouth daily. 90 tablet 2   lisinopril (ZESTRIL) 20 MG tablet Take 1 tablet (20 mg total) by mouth 2 (two) times daily. 180  tablet 2   metFORMIN (GLUCOPHAGE) 500 MG tablet TAKE 1 TABLET(500 MG) BY MOUTH TWICE DAILY WITH A MEAL 90 tablet 1   metoprolol tartrate (LOPRESSOR) 25 MG tablet Take 1 tablet (25 mg total) by mouth 2 (two) times daily. 180 tablet 3   Multiple Vitamin (MULTIVITAMIN) capsule Take 1 capsule by mouth daily.     Omega-3 Fatty Acids (FISH OIL PO) Take 2 capsules by mouth daily.     omeprazole (PRILOSEC) 20 MG capsule Take 1 capsule (20 mg total) by mouth daily. 90 capsule 2   simvastatin (ZOCOR) 20 MG tablet Take 1 tablet (20 mg total) by mouth at bedtime. 90 tablet 3   hydrALAZINE (APRESOLINE) 50 MG tablet Take 1 tablet (50 mg total) by mouth 3 (three) times daily. 270 tablet 3   No facility-administered medications prior to visit.    Allergies  Allergen Reactions   Penicillins Hives    Has patient had a PCN reaction causing immediate rash, facial/tongue/throat swelling, SOB or lightheadedness with hypotension: No Has patient had a PCN reaction causing severe rash involving mucus membranes or skin necrosis: No Has patient had a PCN reaction that required hospitalization No Has patient had a PCN reaction occurring within the last 10 years: No If all of the above answers are "NO", then may proceed with Cephalosporin use.    Sulfonamide Derivatives Rash    Review of Systems  Constitutional:  Negative for appetite change, fatigue and fever.  HENT:  Positive for congestion. Negative for ear pain, sinus pressure and sore throat.   Eyes:  Negative for pain.  Respiratory:  Positive for cough, chest tightness and wheezing. Negative for shortness of breath.   Cardiovascular:  Negative for chest pain and palpitations.  Gastrointestinal:  Negative for abdominal pain, constipation, diarrhea, nausea and vomiting.  Endocrine: Negative.   Genitourinary:  Negative for dysuria and hematuria.  Musculoskeletal:  Negative for arthralgias, back pain, joint swelling and myalgias.  Skin:  Negative for rash.   Allergic/Immunologic: Negative.   Neurological:  Negative for dizziness, weakness and headaches.  Hematological: Negative.   Psychiatric/Behavioral:  Negative for dysphoric mood. The patient is not nervous/anxious.       Objective:    Physical Exam Vitals reviewed.  Constitutional:      Appearance: Normal appearance.  HENT:     Right Ear: Tympanic membrane normal.     Left Ear: Tenderness present. Tympanic membrane is erythematous.     Nose: Nose normal.       Mouth/Throat:     Mouth: Mucous membranes are moist.  Cardiovascular:     Rate and Rhythm: Normal rate and regular rhythm.     Pulses: Normal pulses.     Heart sounds: Normal heart sounds.  Pulmonary:     Effort: Pulmonary effort is normal.     Breath sounds: Wheezing present.     Comments: Decreased breath sounds in bilateral posterior lower lobes  Abdominal:     General: Bowel sounds are normal.     Palpations: Abdomen is soft.  Musculoskeletal:        General: Normal range of motion.     Cervical back: Neck supple.  Skin:    General: Skin is warm and dry.     Capillary Refill: Capillary refill takes less than 2 seconds.  Neurological:     General: No focal deficit present.     Mental Status: She is alert and oriented to person, place, and time.  Psychiatric:        Mood and Affect: Mood normal.        Behavior: Behavior normal.    BP 132/72 (BP Location: Left Arm, Patient Position: Sitting)   Pulse 72   Temp (!) 97 F (36.1 C) (Temporal)   Ht 5' (1.524 m)   Wt 211 lb (95.7 kg)   LMP 05/21/2015 Comment: irregular since age 59   SpO2 96%   BMI 41.21 kg/m  Wt Readings from Last 3 Encounters:  05/21/21 211 lb (95.7 kg)  05/17/21 212 lb 9.6 oz (96.4 kg)  01/24/21 211 lb 2 oz (95.8 kg)    Health Maintenance Due  Topic Date Due   Pneumococcal Vaccine 28-43 Years old (1 - PCV) Never done   FOOT EXAM  Never done   HIV Screening  Never done   Hepatitis C Screening  Never done   PAP SMEAR-Modifier  Never  done   Zoster Vaccines- Shingrix (1 of 2) Never done   MAMMOGRAM  10/05/2020   INFLUENZA VACCINE  04/08/2021    Lab Results  Component Value Date   TSH 2.740 05/16/2021   Lab Results  Component Value Date   WBC 7.2 05/16/2021   HGB 12.5 05/16/2021   HCT 39.0 05/16/2021   MCV 87 05/16/2021   PLT 227 05/16/2021   Lab Results  Component Value Date   NA 141 05/16/2021   K 4.8 05/16/2021   CO2 20 05/16/2021   GLUCOSE 91 05/16/2021   BUN 16 05/16/2021   CREATININE 1.02 (H) 05/16/2021   BILITOT 0.2 05/16/2021   ALKPHOS 73 05/16/2021   AST 14 05/16/2021   ALT 14 05/16/2021   PROT 6.6 05/16/2021   ALBUMIN 4.4 05/16/2021   CALCIUM 10.2 05/16/2021   ANIONGAP 11 08/16/2015   EGFR 61 05/16/2021   Lab Results  Component Value Date   CHOL 166 05/16/2021   Lab Results  Component Value Date   HDL 42 05/16/2021   Lab Results  Component Value Date   LDLCALC 88 05/16/2021   Lab Results  Component Value Date   TRIG 212 (H) 05/16/2021   Lab Results  Component Value Date   CHOLHDL 4.0 05/16/2021   Lab Results  Component Value Date   HGBA1C 5.8 (H) 05/16/2021         Assessment & Plan:   1. COPD with exacerbation (HCC) - azithromycin (ZITHROMAX) 250 MG tablet; Take 2 tablets on day 1, then 1 tablet daily on days 2 through 5  Dispense: 6 tablet;  Refill: 0 - triamcinolone acetonide (KENALOG-40) injection 40 mg - albuterol (PROVENTIL) (2.5 MG/3ML) 0.083% nebulizer solution; Take 3 mLs (2.5 mg total) by nebulization every 6 (six) hours as needed for wheezing or shortness of breath.  Dispense: 150 mL; Refill: 1  2. Cough - albuterol (VENTOLIN HFA) 108 (90 Base) MCG/ACT inhaler; Inhale 2 puffs into the lungs every 6 (six) hours as needed for wheezing or shortness of breath.  Dispense: 8 g; Refill: 2 - promethazine-dextromethorphan (PROMETHAZINE-DM) 6.25-15 MG/5ML syrup; Take 5 mLs by mouth 4 (four) times daily as needed.  Dispense: 118 mL; Refill: 0  3. Other  non-recurrent acute nonsuppurative otitis media of left ear - fluticasone (FLONASE) 50 MCG/ACT nasal spray; Place 2 sprays into both nostrils daily.  Dispense: 16 g; Refill: 6   Use albuterol inhaler and nebulizer as needed Take Zpack as directed Kenalog injection given in office Take cough syrup as directed Follow-up as needed Seek emergency medical care for severe shortness of breath or any other concerning symptoms Use Flonase nasal spray     Follow-up: PRN   I,Lauren M Auman,acting as a Education administrator for [employer], NP.,have documented all relevant documentation on the behalf of Rip Harbour, NP,as directed by  Rip Harbour, NP while in the presence of Rip Harbour, NP.   I, Rip Harbour, NP, have reviewed all documentation for this visit. The documentation on 05/21/21 for the exam, diagnosis, procedures, and orders are all accurate and complete.   SignedJerrell Belfast, DNP 05/21/21 at 4:42 pm

## 2021-05-21 NOTE — Patient Instructions (Addendum)
Use albuterol inhaler and nebulizer as needed Take Zpack as directed Kenalog injection given in office Take cough syrup as directed Follow-up as needed Seek emergency medical care for severe shortness of breath or any other concerning symptoms Use Flonase nasal spray      Chronic Obstructive Pulmonary Disease Exacerbation Chronic obstructive pulmonary disease (COPD) is a long-term (chronic) lung problem. In COPD, the flow of air from the lungs is limited. COPD exacerbations are times that breathing gets worse and you need more than your normal treatment. Without treatment, they can be life-threatening. If they happen often, your lungs can become more damaged. What are the causes? Having infections that affect your airways and lungs. Being exposed to: Smoke. Air pollution. Chemical fumes. Dust. Things that can cause an allergic reaction (allergens). Not taking your usual COPD medicines as told. Having medical problems already, such as heart failure or infections not involving the lungs. In many cases, the cause is not known. What increases the risk? Smoking. Being an older adult. Having frequent prior COPD exacerbations. What are the signs or symptoms? Increased coughing. Increased mucus from your lungs. Increased wheezing. Increased shortness of breath. Fast breathing and finding it hard to breathe. Chest tightness. Less energy than usual. Sleep disruption from symptoms. Confusion. Increased sleepiness. Often, these symptoms happen or get worse even with the use of medicines. How is this treated? Treatment for this condition depends on how bad it is and the cause of the symptoms. You may need to stay in the hospital for treatment. Treatment may include: Taking medicines. Using oxygen. Being treated with different ways to clear your airway, such as using a mask to deliver oxygen. Follow these instructions at home: Medicines Take over-the-counter and prescription  medicines only as told by your doctor. Use all inhaled medicines the correct way. If you were prescribed an antibiotic or steroid medicine, take it as told by your doctor. Do not stop taking it even if you start to feel better. Lifestyle Do not smoke or use any products that contain nicotine or tobacco. If you need help quitting, ask your doctor. Eat healthy foods. Exercise regularly. Get enough sleep. Most adults need 7 or more hours per night. Avoid tobacco smoke and other things that can bother your lungs. Several times a day, wash your hands with soap and water for at least 20 seconds. If you cannot use soap and water, use hand sanitizer. This may help keep you from getting an infection. During flu season, avoid areas that are crowded with people. General instructions Drink enough fluid to keep your pee (urine) pale yellow. Do not do this if your doctor has told you not to. Use a cool mist machine (vaporizer). If you use oxygen or a machine that turns medicine into a mist (nebulizer), continue to use it as told. Keep all follow-up visits. How is this prevented? Keep up with shots (vaccinations) as told by your doctor. Be sure to get a yearly flu (influenza) shot. If you smoke, quit smoking. Smoking makes the problem worse. Follow all instructions for rehabilitation. These are steps you can take to make your body work better. Work with your doctor to develop and follow an action plan. This tells you what steps to take when you experience certain symptoms. Contact a doctor if: Your COPD symptoms get worse than normal. Get help right away if: You are short of breath and it gets worse, even when you are resting. You have trouble talking. You have chest pain. You cough  up blood. You have a fever. You keep vomiting. You feel weak or you pass out (faint). You feel confused. You are not able to sleep because of your symptoms. You have trouble doing daily activities. These symptoms may  be an emergency. Get help right away. Call your local emergency services (911 in the [place] Do not wait to see if the symptoms will go away. Do not drive yourself to the hospital. Summary COPD exacerbations are times that breathing gets worse and you need more treatment than normal. COPD exacerbations can be very serious and may cause your lungs to become more damaged. Do not smoke. If you need help quitting, ask your doctor. Stay up to date on your shots. Get a flu shot every year. This information is not intended to replace advice given to you by your health care provider. Make sure you discuss any questions you have with your health care provider. Document Revised: 07/18/2020 Document Reviewed: 07/03/2020 Elsevier Patient Education  2022 [employer].

## 2021-05-24 ENCOUNTER — Telehealth: Payer: Self-pay

## 2021-05-24 NOTE — Telephone Encounter (Signed)
Pt calling as she was told if not improving to call. States she is continuing to have wheezing and feels medication is not working. Pt was started on azithromycin 9/13. She is requesting more medication. Please advise.   Lorita Officer, CCMA 05/24/21 10:03 AM

## 2021-05-26 ENCOUNTER — Other Ambulatory Visit: Payer: Self-pay | Admitting: Nurse Practitioner

## 2021-05-26 DIAGNOSIS — J441 Chronic obstructive pulmonary disease with (acute) exacerbation: Secondary | ICD-10-CM

## 2021-05-26 MED ORDER — PREDNISONE 20 MG PO TABS: 20.0000 mg | ORAL_TABLET | Freq: Every day | ORAL | 0 refills | Status: DC

## 2021-05-27 NOTE — Telephone Encounter (Signed)
Called pt. Pt aware.   Lorita Officer, [place] 05/27/21 7:55 AM

## 2021-06-03 ENCOUNTER — Other Ambulatory Visit: Payer: Self-pay | Admitting: *Deleted

## 2021-06-03 MED ORDER — LISINOPRIL 20 MG PO TABS: 20.0000 mg | ORAL_TABLET | Freq: Two times a day (BID) | ORAL | 0 refills | Status: DC

## 2021-06-12 ENCOUNTER — Other Ambulatory Visit: Payer: Self-pay | Admitting: Nurse Practitioner

## 2021-06-12 DIAGNOSIS — R059 Cough, unspecified: Secondary | ICD-10-CM

## 2021-06-14 ENCOUNTER — Other Ambulatory Visit: Payer: Self-pay | Admitting: Legal Medicine

## 2021-06-14 DIAGNOSIS — E1169 Type 2 diabetes mellitus with other specified complication: Secondary | ICD-10-CM

## 2021-07-01 ENCOUNTER — Other Ambulatory Visit: Payer: Self-pay | Admitting: Cardiovascular Disease

## 2021-07-01 ENCOUNTER — Other Ambulatory Visit: Payer: Self-pay

## 2021-07-01 MED ORDER — LISINOPRIL 20 MG PO TABS: 20.0000 mg | ORAL_TABLET | Freq: Every day | ORAL | 0 refills | Status: DC

## 2021-07-25 ENCOUNTER — Ambulatory Visit (INDEPENDENT_AMBULATORY_CARE_PROVIDER_SITE_OTHER): Admitting: Cardiovascular Disease

## 2021-07-25 ENCOUNTER — Other Ambulatory Visit: Payer: Self-pay

## 2021-07-25 ENCOUNTER — Encounter: Payer: Self-pay | Admitting: Cardiovascular Disease

## 2021-07-25 VITALS — BP 114/60 | HR 60 | Ht 61.0 in | Wt 211.4 lb

## 2021-07-25 DIAGNOSIS — R011 Cardiac murmur, unspecified: Secondary | ICD-10-CM

## 2021-07-25 DIAGNOSIS — E785 Hyperlipidemia, unspecified: Secondary | ICD-10-CM | POA: Diagnosis not present

## 2021-07-25 DIAGNOSIS — I491 Atrial premature depolarization: Secondary | ICD-10-CM | POA: Diagnosis not present

## 2021-07-25 DIAGNOSIS — I10 Essential (primary) hypertension: Secondary | ICD-10-CM | POA: Diagnosis not present

## 2021-07-25 DIAGNOSIS — E1169 Type 2 diabetes mellitus with other specified complication: Secondary | ICD-10-CM

## 2021-07-25 MED ORDER — HYDRALAZINE HCL 50 MG PO TABS: 50.0000 mg | ORAL_TABLET | Freq: Three times a day (TID) | ORAL | 3 refills | Status: DC

## 2021-07-25 MED ORDER — FLECAINIDE ACETATE 50 MG PO TABS: 50.0000 mg | ORAL_TABLET | Freq: Two times a day (BID) | ORAL | 3 refills | Status: DC

## 2021-07-25 MED ORDER — LISINOPRIL 20 MG PO TABS: 20.0000 mg | ORAL_TABLET | Freq: Every day | ORAL | 3 refills | Status: DC

## 2021-07-25 MED ORDER — DILTIAZEM HCL ER COATED BEADS 240 MG PO CP24: 240.0000 mg | ORAL_CAPSULE | Freq: Every day | ORAL | 3 refills | Status: DC

## 2021-07-25 MED ORDER — FUROSEMIDE 20 MG PO TABS: 20.0000 mg | ORAL_TABLET | ORAL | 1 refills | Status: DC

## 2021-07-25 MED ORDER — METOPROLOL TARTRATE 25 MG PO TABS
12.5000 mg | ORAL_TABLET | Freq: Two times a day (BID) | ORAL | 3 refills | Status: DC
Start: 2021-07-25 — End: 2021-10-25

## 2021-07-25 NOTE — Patient Instructions (Signed)
Medication Instructions:  Your physician has recommended you make the following change in your medication:   DECREASE Metoprolol to 12.5 mg (1/2 tablet) twice daily.   Your cardiac medications have been refilled today.  *If you need a refill on your cardiac medications before your next appointment, please call your pharmacy*   Lab Work: None ordered  If you have labs (blood work) drawn today and your tests are completely normal, you will receive your results only by: MyChart Message (if you have MyChart) OR A paper copy in the mail If you have any lab test that is abnormal or we need to change your treatment, we will call you to review the results.   Testing/Procedures: None ordered   Follow-Up: At [facility], you and your health needs are our priority.  As part of our continuing mission to provide you with exceptional heart care, we have created designated Provider Care Teams.  These Care Teams include your primary Cardiologist (physician) and Advanced Practice Providers (APPs -  Physician Assistants and Nurse Practitioners) who all work together to provide you with the care you need, when you need it.  We recommend signing up for the patient portal called "MyChart".  Sign up information is provided on this After Visit Summary.  MyChart is used to connect with patients for Virtual Visits (Telemedicine).  Patients are able to view lab/test results, encounter notes, upcoming appointments, etc.  Non-urgent messages can be sent to your provider as well.   To learn more about what you can do with MyChart, go to [email or web address].    Your next appointment:   Your physician wants you to follow-up in: 6 months You will receive a reminder letter in the mail two months in advance. If you don't receive a letter, please call our office to schedule the follow-up appointment.   The format for your next appointment:   In Person  Provider:   You may see Lorine Bears, MD or one of  the following Advanced Practice Providers on your designated Care Team:   Nicolasa Ducking, NP Eula Listen, PA-C Cadence Fransico Michael, PA-C{   Other Instructions N/A

## 2021-07-25 NOTE — Progress Notes (Signed)
Cardiology Office Note   Date:  07/25/2021   ID:  Lori Phelps, DOB [birth year], MRN [id number]  PCP:  Lori Miyamoto, MD  Cardiologist:   Lori Bears, MD   Chief Complaint  Patient presents with   Other    6 Month f/u no complaints today. Meds reviewed verbally with pt.       History of Present Illness: Lori Phelps is a 65 y.o. female who presents for regarding palpitations due to atrial tachycardia and brief nonsustained atrial fibrillation.  She has type 2 diabetes, essential hypertension and hypothyroidism. She has been followed by Dr. Johney Phelps in the past for arrhythmia.  Symptoms have been controlled with flecainide, diltiazem and metoprolol. Most recent echocardiogram in April 2021 showed normal LV systolic function, indeterminate diastolic function, normal pulmonary pressure and no significant valvular abnormalities.  She had atypical chest pain last year and had a Lexiscan Myoview in September 2021 which showed no evidence of ischemia with normal ejection fraction.    She has been doing well with no recent chest pain, shortness of breath or palpitations.  Her heart rate does go to the low 60s frequently.  She denies syncope or presyncope. .  Past Medical History:  Diagnosis Date   Atrial tachycardia (HCC)    Chronic atrial fibrillation (HCC)    Diabetes mellitus without complication (HCC)    Essential hypertension    GERD (gastroesophageal reflux disease)    HLD (hyperlipidemia)    HTN (hypertension)    Hx of bronchitis    uses MDI prn and on advair daily but no dx of asthma    Hypothyroidism    Mixed hyperlipidemia    Overweight    Palpitations    hx PAC"S   Phlebitis and thrombophlebitis of deep vessels of lower extremities, left (HCC)    Type 2 diabetes mellitus with other specified complication [facility])     Past Surgical History:  Procedure Laterality Date   BACK SURGERY  1994   COLONOSCOPY  2002   DILATATION & CURETTAGE/HYSTEROSCOPY WITH  MYOSURE N/A 08/22/2015   Procedure: DILATATION & CURETTAGE/HYSTEROSCOPY WITH MYOSURE AND POLYPECTOMY;  Surgeon: Lori Cote, MD;  Location: [facility];  Service: Gynecology;  Laterality: N/A;   NASAL SINUS SURGERY     [id number]     Current Outpatient Medications  Medication Sig Dispense Refill   Accu-Chek Softclix Lancets lancets Use as instructed 100 each 12   albuterol (PROVENTIL) (2.5 MG/3ML) 0.083% nebulizer solution Take 3 mLs (2.5 mg total) by nebulization every 6 (six) hours as needed for wheezing or shortness of breath. 150 mL 1   albuterol (VENTOLIN HFA) 108 (90 Base) MCG/ACT inhaler Inhale 2 puffs into the lungs every 6 (six) hours as needed for wheezing or shortness of breath. 18 g 3   albuterol (VENTOLIN HFA) 108 (90 Base) MCG/ACT inhaler INHALE 2 PUFFS INTO THE LUNGS EVERY 6 HOURS AS NEEDED FOR WHEEZING OR SHORTNESS OF BREATH 8.5 g 1   aspirin EC 81 MG tablet Take 81 mg by mouth daily.     diltiazem (CARDIZEM CD) 240 MG 24 hr capsule Take 1 capsule (240 mg total) by mouth daily. 90 capsule 3   esomeprazole (NEXIUM) 40 MG capsule Take 1 capsule (40 mg total) by mouth daily. 90 capsule 1   flecainide (TAMBOCOR) 50 MG tablet Take 1 tablet (50 mg total) by mouth 2 (two) times daily. May take an extra tablet for palpitations 180 tablet 3   fluticasone (FLONASE) 50 MCG/ACT  nasal spray Place 2 sprays into both nostrils daily. 16 g 6   fluticasone-salmeterol (WIXELA INHUB) 250-50 MCG/ACT AEPB Inhale 1 puff into the lungs in the morning and at bedtime. 180 each 2   furosemide (LASIX) 20 MG tablet Take 1 tablet (20 mg total) by mouth every other day. 45 tablet 1   glucose blood (ACCU-CHEK AVIVA PLUS) test strip Use as instructed 100 each 12   glucose blood test strip Use as instructed 100 each 12   levothyroxine (SYNTHROID) 25 MCG tablet Take 1 tablet (25 mcg total) by mouth daily. 90 tablet 2   lisinopril (ZESTRIL) 20 MG tablet Take 1 tablet (20 mg total) by mouth daily. 90 tablet 0    metFORMIN (GLUCOPHAGE) 500 MG tablet TAKE 1 TABLET(500 MG) BY MOUTH TWICE DAILY WITH A MEAL 180 tablet 1   metoprolol tartrate (LOPRESSOR) 25 MG tablet Take 1 tablet (25 mg total) by mouth 2 (two) times daily. 180 tablet 3   Multiple Vitamin (MULTIVITAMIN) capsule Take 1 capsule by mouth daily.     Omega-3 Fatty Acids (FISH OIL PO) Take 2 capsules by mouth daily.     omeprazole (PRILOSEC) 20 MG capsule Take 1 capsule (20 mg total) by mouth daily. 90 capsule 2   simvastatin (ZOCOR) 20 MG tablet Take 1 tablet (20 mg total) by mouth at bedtime. 90 tablet 3   hydrALAZINE (APRESOLINE) 50 MG tablet Take 1 tablet (50 mg total) by mouth 3 (three) times daily. 270 tablet 3   No current facility-administered medications for this visit.    Allergies:   Penicillins and Sulfonamide derivatives    Social History:  The patient  reports that she has quit smoking. She has never used smokeless tobacco. She reports that she does not drink alcohol and does not use drugs.   Family History:  The patient's family history includes Heart failure in her father; Kidney disease in her mother and another family member.    ROS:  Please see the history of present illness.   Otherwise, review of systems are positive for none.   All other systems are reviewed and negative.    PHYSICAL EXAM: VS:  BP 114/60 (BP Location: Left Arm, Patient Position: Sitting, Cuff Size: Large)   Pulse 60   Ht 5\' 1"  (1.549 m)   Wt 211 lb 6 oz (95.9 kg)   LMP 05/21/2015 Comment: irregular since age 9   SpO2 98%   BMI 39.94 kg/m  , BMI Body mass index is 39.94 kg/m. GEN: Well nourished, well developed, in no acute distress  HEENT: normal  Neck: no JVD, carotid bruits, or masses Cardiac: RRR; no  rubs, or gallops,no edema .  2/ 6 systolic murmur in the aortic area which is early peaking Respiratory:  clear to auscultation bilaterally, normal work of breathing GI: soft, nontender, nondistended, + BS MS: no deformity or atrophy  Skin:  warm and dry, no rash Neuro:  Strength and sensation are intact Psych: euthymic mood, full affect   EKG:  EKG is not ordered today. EKG from February showed sinus rhythm with no significant ischemic changes.  Low voltage   Recent Labs: 05/16/2021: ALT 14; BUN 16; Creatinine, Ser 1.02; Hemoglobin 12.5; Platelets 227; Potassium 4.8; Sodium 141; TSH 2.740    Lipid Panel    Component Value Date/Time   CHOL 166 05/16/2021 1201   TRIG 212 (H) 05/16/2021 1201   HDL 42 05/16/2021 1201   CHOLHDL 4.0 05/16/2021 1201   LDLCALC 88 05/16/2021  1201      Wt Readings from Last 3 Encounters:  07/25/21 211 lb 6 oz (95.9 kg)  05/21/21 211 lb (95.7 kg)  05/17/21 212 lb 9.6 oz (96.4 kg)       ASSESSMENT AND PLAN:  1.  Palpitations due to PACs, atrial tachycardia and nonsustained atrial fibrillation:  Symptoms are well controlled on current medications including flecainide, metoprolol and diltiazem.   However, her heart rate seems to be on the low side lately.  Thus, I elected to decrease metoprolol to 12.5 mg twice daily.  I will consider stopping metoprolol altogether if she remains stable in order to decrease the burden of medications.  2. Essential hypertension: Blood pressure is well controlled on current medication.  3. Diabetes mellitus:  managed by primary care physician.  4.  Hyperlipidemia:  I reviewed most recent lipid profile done in September with her primary care physician which showed a triglyceride of 212 and an LDL of 88.  She is currently on simvastatin 20 mg at bedtime.  Consider a more potent statin like atorvastatin or rosuvastatin to get LDL below 70 given that she is diabetic.  5.  Cardiac murmur: She has a murmur suggestive of aortic sclerosis.  Echocardiogram in April 2021 showed no significant aortic stenosis.  We will plan on repeat echocardiogram in 1 year.   Disposition:   Follow-up in 6 months.  Signed,  Lori Bears, MD  07/25/2021 8:27 AM    [facility]

## 2021-09-09 ENCOUNTER — Other Ambulatory Visit: Payer: Self-pay | Admitting: Legal Medicine

## 2021-09-09 DIAGNOSIS — U071 COVID-19: Secondary | ICD-10-CM

## 2021-09-09 MED ORDER — NIRMATRELVIR/RITONAVIR (PAXLOVID)TABLET: 3.0000 | ORAL_TABLET | Freq: Two times a day (BID) | ORAL | 0 refills | Status: AC

## 2021-10-25 ENCOUNTER — Other Ambulatory Visit: Payer: Self-pay

## 2021-10-25 ENCOUNTER — Ambulatory Visit (INDEPENDENT_AMBULATORY_CARE_PROVIDER_SITE_OTHER): Payer: Medicare Other | Admitting: Legal Medicine

## 2021-10-25 ENCOUNTER — Encounter: Payer: Self-pay | Admitting: Legal Medicine

## 2021-10-25 VITALS — BP 100/60 | HR 70 | Temp 98.0°F | Resp 15 | Ht 61.0 in | Wt 205.0 lb

## 2021-10-25 DIAGNOSIS — E1169 Type 2 diabetes mellitus with other specified complication: Secondary | ICD-10-CM

## 2021-10-25 DIAGNOSIS — E66812 Obesity, class 2: Secondary | ICD-10-CM | POA: Insufficient documentation

## 2021-10-25 DIAGNOSIS — J4489 Other specified chronic obstructive pulmonary disease: Secondary | ICD-10-CM

## 2021-10-25 DIAGNOSIS — I10 Essential (primary) hypertension: Secondary | ICD-10-CM | POA: Diagnosis not present

## 2021-10-25 DIAGNOSIS — I471 Supraventricular tachycardia: Secondary | ICD-10-CM | POA: Diagnosis not present

## 2021-10-25 DIAGNOSIS — E038 Other specified hypothyroidism: Secondary | ICD-10-CM | POA: Diagnosis not present

## 2021-10-25 DIAGNOSIS — J449 Chronic obstructive pulmonary disease, unspecified: Secondary | ICD-10-CM

## 2021-10-25 DIAGNOSIS — E782 Mixed hyperlipidemia: Secondary | ICD-10-CM

## 2021-10-25 DIAGNOSIS — I5032 Chronic diastolic (congestive) heart failure: Secondary | ICD-10-CM | POA: Insufficient documentation

## 2021-10-25 DIAGNOSIS — K2100 Gastro-esophageal reflux disease with esophagitis, without bleeding: Secondary | ICD-10-CM

## 2021-10-25 DIAGNOSIS — I4719 Other supraventricular tachycardia: Secondary | ICD-10-CM

## 2021-10-25 DIAGNOSIS — H65192 Other acute nonsuppurative otitis media, left ear: Secondary | ICD-10-CM

## 2021-10-25 MED ORDER — FLUTICASONE-SALMETEROL 250-50 MCG/ACT IN AEPB: 1.0000 | INHALATION_SPRAY | Freq: Two times a day (BID) | RESPIRATORY_TRACT | 2 refills | Status: DC

## 2021-10-25 MED ORDER — METFORMIN HCL 500 MG PO TABS: ORAL_TABLET | ORAL | 1 refills | Status: DC

## 2021-10-25 MED ORDER — LEVOTHYROXINE SODIUM 25 MCG PO TABS: 25.0000 ug | ORAL_TABLET | Freq: Every day | ORAL | 2 refills | Status: DC

## 2021-10-25 MED ORDER — OMEPRAZOLE 20 MG PO CPDR: 20.0000 mg | DELAYED_RELEASE_CAPSULE | Freq: Every day | ORAL | 2 refills | Status: DC

## 2021-10-25 MED ORDER — SIMVASTATIN 20 MG PO TABS: 20.0000 mg | ORAL_TABLET | Freq: Every day | ORAL | 1 refills | Status: DC

## 2021-10-25 MED ORDER — FLECAINIDE ACETATE 50 MG PO TABS: 50.0000 mg | ORAL_TABLET | Freq: Two times a day (BID) | ORAL | 1 refills | Status: DC

## 2021-10-25 MED ORDER — HYDRALAZINE HCL 50 MG PO TABS: 50.0000 mg | ORAL_TABLET | Freq: Three times a day (TID) | ORAL | 1 refills | Status: DC

## 2021-10-25 MED ORDER — ALBUTEROL SULFATE HFA 108 (90 BASE) MCG/ACT IN AERS: 2.0000 | INHALATION_SPRAY | Freq: Four times a day (QID) | RESPIRATORY_TRACT | 3 refills | Status: DC | PRN

## 2021-10-25 MED ORDER — DILTIAZEM HCL ER COATED BEADS 240 MG PO CP24: 240.0000 mg | ORAL_CAPSULE | Freq: Every day | ORAL | 1 refills | Status: DC

## 2021-10-25 MED ORDER — LISINOPRIL 20 MG PO TABS: 20.0000 mg | ORAL_TABLET | Freq: Every day | ORAL | 1 refills | Status: DC

## 2021-10-25 MED ORDER — METOPROLOL TARTRATE 25 MG PO TABS: 12.5000 mg | ORAL_TABLET | Freq: Two times a day (BID) | ORAL | 3 refills | Status: DC

## 2021-10-25 MED ORDER — FISH OIL 435 MG PO CAPS: 2.0000 | ORAL_CAPSULE | Freq: Every day | ORAL | 2 refills | Status: AC

## 2021-10-25 MED ORDER — FUROSEMIDE 20 MG PO TABS: 20.0000 mg | ORAL_TABLET | ORAL | 1 refills | Status: DC

## 2021-10-25 MED ORDER — FLUTICASONE PROPIONATE 50 MCG/ACT NA SUSP: 2.0000 | Freq: Every day | NASAL | 6 refills | Status: DC

## 2021-10-25 NOTE — Progress Notes (Signed)
Subjective:  Patient ID: Lori Phelps, female    DOB: [birth year]  Age: 66 y.o. MRN: [id number]  Chief Complaint  Patient presents with   Hypertension   Hyperlipidemia   Diabetes    HPI Diabetes:  Complications: hyperlipidemia Glucose checking: Glucose logs: Hypoglycemia: Most recent A1C: 5.8% Current medications: Metformin 500 mg twice a day. Last Eye Exam: 03/06/2021 Foot checks:  Hyperlipidemia: Current medications: She takes simvastatin 20 mg daily, Fish oil OTC. Patient presents with hyperlipidemia.  Compliance with treatment has been good; patient takes medicines as directed, maintains low cholesterol diet, follows up as directed, and maintains exercise regimen.  Patient is using simvastatin without problems.  Hypertension: Complications: Current medications: Metoprolol 25 mg twice daily, dialtiazem 240 mg daily, furosemide 20 daily, lisinopril 20 mg daily  Hypothyroidism: She takes levothyroxine 25 mcg daily.  Diet: Exercise:  Current Outpatient Medications on File Prior to Visit  Medication Sig Dispense Refill   Accu-Chek Softclix Lancets lancets Use as instructed 100 each 12   albuterol (PROVENTIL) (2.5 MG/3ML) 0.083% nebulizer solution Take 3 mLs (2.5 mg total) by nebulization every 6 (six) hours as needed for wheezing or shortness of breath. 150 mL 1   aspirin EC 81 MG tablet Take 81 mg by mouth daily.     glucose blood (ACCU-CHEK AVIVA PLUS) test strip Use as instructed 100 each 12   glucose blood test strip Use as instructed 100 each 12   Multiple Vitamin (MULTIVITAMIN) capsule Take 1 capsule by mouth daily.     No current facility-administered medications on file prior to visit.   Past Medical History:  Diagnosis Date   Atrial tachycardia ([place])    Chronic atrial fibrillation (HCC)    Diabetes mellitus without complication (HCC)    Essential hypertension    GERD (gastroesophageal reflux disease)    HLD (hyperlipidemia)    HTN (hypertension)    Hx of  bronchitis    uses MDI prn and on advair daily but no dx of asthma    Hypothyroidism    Mixed hyperlipidemia    Overweight    Palpitations    hx PAC"S   Phlebitis and thrombophlebitis of deep vessels of lower extremities, left ([place])    Type 2 diabetes mellitus with other specified complication [facility])    Past Surgical History:  Procedure Laterality Date   BACK SURGERY  1994   COLONOSCOPY  2002   DILATATION & CURETTAGE/HYSTEROSCOPY WITH MYOSURE N/A 08/22/2015   Procedure: DILATATION & CURETTAGE/HYSTEROSCOPY WITH MYOSURE AND POLYPECTOMY;  Surgeon: Paula Compton, MD;  Location: [facility];  Service: Gynecology;  Laterality: N/A;   NASAL SINUS SURGERY     [id number]    Family History  Problem Relation Age of Onset   Kidney disease Other    Kidney disease Mother    Heart failure Father    Colon cancer Neg Hx    Colon polyps Neg Hx    Esophageal cancer Neg Hx    Rectal cancer Neg Hx    Stomach cancer Neg Hx    Social History   Socioeconomic History   Marital status: Married    Spouse name: Not on file   Number of children: 2   Years of education: Not on file   Highest education level: Not on file  Occupational History   Not on file  Tobacco Use   Smoking status: Former   Smokeless tobacco: Never  Vaping Use   Vaping Use: Never used  Substance and Sexual Activity  Alcohol use: No    Alcohol/week: 0.0 standard drinks   Drug use: No   Sexual activity: Not on file  Other Topics Concern   Not on file  Social History Narrative   Pt lives in [place] with spouse.  Retired Landscape architect for [employer].   Social Determinants of Health   Financial Resource Strain: Not on file  Food Insecurity: Not on file  Transportation Needs: Not on file  Physical Activity: Not on file  Stress: Not on file  Social Connections: Not on file    Review of Systems  Constitutional:  Negative for chills, fatigue and fever.  HENT:  Negative for congestion, ear pain and  sore throat.   Eyes:  Negative for visual disturbance.  Respiratory:  Negative for cough and shortness of breath.   Cardiovascular:  Negative for chest pain and palpitations.  Gastrointestinal:  Negative for abdominal pain, constipation, diarrhea, nausea and vomiting.  Endocrine: Negative for polydipsia, polyphagia and polyuria.  Genitourinary:  Negative for difficulty urinating and dysuria.  Musculoskeletal:  Negative for arthralgias, back pain and myalgias.  Skin:  Negative for rash.  Neurological:  Negative for headaches.  Psychiatric/Behavioral:  Negative for dysphoric mood. The patient is not nervous/anxious.     Objective:  BP 100/60    Pulse 70    Temp 98 F (36.7 C)    Resp 15    Ht 5\' 1"  (1.549 m)    Wt 205 lb (93 kg)    LMP 05/21/2015 Comment: irregular since age 76    SpO2 96%    BMI 38.73 kg/m   BP/Weight 10/25/2021 07/25/2021 [id number]  Systolic BP [id number]  Diastolic BP 60 60 72  Wt. (Lbs) 205 211.38 211  BMI 38.73 39.94 41.21    Physical Exam Vitals reviewed.  Constitutional:      General: She is not in acute distress.    Appearance: Normal appearance. She is obese.  HENT:     Head: Normocephalic.     Right Ear: Tympanic membrane normal.     Left Ear: Tympanic membrane normal.     Mouth/Throat:     Mouth: Mucous membranes are moist.  Eyes:     Conjunctiva/sclera: Conjunctivae normal.     Pupils: Pupils are equal, round, and reactive to light.  Cardiovascular:     Rate and Rhythm: Normal rate and regular rhythm.     Pulses: Normal pulses.     Heart sounds: No murmur heard.   No gallop.  Pulmonary:     Effort: Pulmonary effort is normal. No respiratory distress.     Breath sounds: Normal breath sounds. No wheezing.  Abdominal:     General: Abdomen is flat. Bowel sounds are normal. There is no distension.     Tenderness: There is no abdominal tenderness.  Musculoskeletal:        General: Normal range of motion.     Cervical back: Normal range of  motion and neck supple.     Right lower leg: No edema.     Left lower leg: No edema.  Skin:    General: Skin is warm.     Capillary Refill: Capillary refill takes less than 2 seconds.  Neurological:     General: No focal deficit present.     Mental Status: She is alert and oriented to person, place, and time. Mental status is at baseline.     Gait: Gait normal.     Deep Tendon Reflexes: Reflexes  normal.        Lab Results  Component Value Date   WBC 10.1 10/25/2021   HGB 13.0 10/25/2021   HCT 39.6 10/25/2021   PLT 250 10/25/2021   GLUCOSE 95 10/25/2021   CHOL 187 10/25/2021   TRIG 163 (H) 10/25/2021   HDL 50 10/25/2021   LDLCALC 108 (H) 10/25/2021   ALT 14 10/25/2021   AST 16 10/25/2021   NA 137 10/25/2021   K 5.8 (H) 10/25/2021   CL 105 10/25/2021   CREATININE 1.11 (H) 10/25/2021   BUN 25 10/25/2021   CO2 19 (L) 10/25/2021   TSH 2.120 10/25/2021   HGBA1C 5.8 (H) 10/25/2021      Assessment & Plan:   Problem List Items Addressed This Visit       Cardiovascular and Mediastinum   Atrial tachycardia (HCC) - Primary   Relevant Medications   simvastatin (ZOCOR) 20 MG tablet   metoprolol tartrate (LOPRESSOR) 25 MG tablet   lisinopril (ZESTRIL) 20 MG tablet   hydrALAZINE (APRESOLINE) 50 MG tablet   furosemide (LASIX) 20 MG tablet   flecainide (TAMBOCOR) 50 MG tablet   diltiazem (CARDIZEM CD) 240 MG 24 hr capsule Patient has a diagnosis of paroxysmal atrial tachycardia.   Patient is on no anticoagulant and has controled ventricular response.  Patient is CV stable.    Essential hypertension   Relevant Medications   simvastatin (ZOCOR) 20 MG tablet   metoprolol tartrate (LOPRESSOR) 25 MG tablet   lisinopril (ZESTRIL) 20 MG tablet   hydrALAZINE (APRESOLINE) 50 MG tablet   furosemide (LASIX) 20 MG tablet   flecainide (TAMBOCOR) 50 MG tablet   diltiazem (CARDIZEM CD) 240 MG 24 hr capsule   Other Relevant Orders   Comprehensive metabolic panel (Completed)   CBC  with Differential/Platelet (Completed) An individual hypertension care plan was established and reinforced today.  The patient's status was assessed using clinical findings on exam and labs or diagnostic tests. The patient's success at meeting treatment goals on disease specific evidence-based guidelines and found to be well controlled. SELF MANAGEMENT: The patient and I together assessed ways to personally work towards obtaining the recommended goals. RECOMMENDATIONS: avoid decongestants found in common cold remedies, decrease consumption of alcohol, perform routine monitoring of BP with home BP cuff, exercise, reduction of dietary salt, take medicines as prescribed, try not to miss doses and quit smoking.  Regular exercise and maintaining a healthy weight is needed.  Stress reduction may help. A CLINICAL SUMMARY including written plan identify barriers to care unique to individual due to social or financial issues.  We attempt to mutually creat solutions for individual and family understanding.      Endocrine   Type 2 diabetes mellitus with other specified complication (HCC)   Relevant Medications   simvastatin (ZOCOR) 20 MG tablet   metFORMIN (GLUCOPHAGE) 500 MG tablet   lisinopril (ZESTRIL) 20 MG tablet   Other Relevant Orders   Hemoglobin A1c (Completed)   Microalbumin / creatinine urine ratio An individual care plan for diabetes was established and reinforced today.  The patient's status was assessed using clinical findings on exam, labs and diagnostic testing. Patient success at meeting goals based on disease specific evidence-based guidelines and found to be good controlled. Medications were assessed and patient's understanding of the medical issues , including barriers were assessed. Recommend adherence to a diabetic diet, a graduated exercise program, HgbA1c level is checked quarterly, and urine microalbumin performed yearly .  Annual mono-filament sensation testing performed. Lower blood  pressure and control hyperlipidemia is important. Get annual eye exams and annual flu shots and smoking cessation discussed.  Self management goals were discussed.    Hypothyroidism   Relevant Medications   metoprolol tartrate (LOPRESSOR) 25 MG tablet   levothyroxine (SYNTHROID) 25 MCG tablet   Other Relevant Orders   TSH (Completed) Patient is known to have hypothyroidism and is n treatment with levothyroxine 43mcg.  Patient was diagnosed 10 years ago.  Other treatment includes none.  Patient is compliant with medicines and last TSH 6 months ago.  Last TSH was normal.      Other   Mixed hyperlipidemia   Relevant Medications   simvastatin (ZOCOR) 20 MG tablet   metoprolol tartrate (LOPRESSOR) 25 MG tablet   lisinopril (ZESTRIL) 20 MG tablet   hydrALAZINE (APRESOLINE) 50 MG tablet   furosemide (LASIX) 20 MG tablet   flecainide (TAMBOCOR) 50 MG tablet   diltiazem (CARDIZEM CD) 240 MG 24 hr capsule   Other Relevant Orders   Lipid panel (Completed) AN INDIVIDUAL CARE PLAN for hyperlipidemia/ cholesterol was established and reinforced today.  The patient's status was assessed using clinical findings on exam, lab and other diagnostic tests. The patient's disease status was assessed based on evidence-based guidelines and found to be well controlled. MEDICATIONS were reviewed. SELF MANAGEMENT GOALS have been discussed and patient's success at attaining the goal of low cholesterol was assessed. RECOMMENDATION given include regular exercise 3 days a week and low cholesterol/low fat diet. CLINICAL SUMMARY including written plan to identify barriers unique to the patient due to social or economic  reasons was discussed.    Other Visit Diagnoses     Gastroesophageal reflux disease with esophagitis without hemorrhage       Relevant Medications   omeprazole (PRILOSEC) 20 MG capsule Plan of care was formulated today.  She is doing well.  A plan of care was formulated using patient exam, tests and  other sources to optimize care using evidence based information.  Recommend no smoking, no eating after supper, avoid fatty foods, elevate Head of bed, avoid tight fitting clothing.  Continue on omeprazole .    Obstructive chronic bronchitis without exacerbation (HCC)       Relevant Medications   fluticasone-salmeterol (WIXELA INHUB) 250-50 MCG/ACT AEPB   fluticasone (FLONASE) 50 MCG/ACT nasal spray   albuterol (VENTOLIN HFA) 108 (90 Base) MCG/ACT inhaler An individualize plan was formulated for care of COPD.  Treatment is evidence based.  She will continue on inhalers, avoid smoking and smoke.  Regular exercise with help with dyspnea. Routine follow ups and medication compliance is needed.    Other non-recurrent acute nonsuppurative otitis media of left ear       Relevant Medications   fluticasone (FLONASE) 50 MCG/ACT nasal spray Patient has SOM     .  Meds ordered this encounter  Medications   simvastatin (ZOCOR) 20 MG tablet    Sig: Take 1 tablet (20 mg total) by mouth at bedtime.    Dispense:  90 tablet    Refill:  1   omeprazole (PRILOSEC) 20 MG capsule    Sig: Take 1 capsule (20 mg total) by mouth daily.    Dispense:  90 capsule    Refill:  2   Omega-3 Fatty Acids (FISH OIL) 435 MG CAPS    Sig: Take 2 capsules (870 mg total) by mouth daily.    Dispense:  180 capsule    Refill:  2   metoprolol tartrate (LOPRESSOR) 25 MG tablet  Sig: Take 0.5 tablets (12.5 mg total) by mouth 2 (two) times daily.    Dispense:  45 tablet    Refill:  3   metFORMIN (GLUCOPHAGE) 500 MG tablet    Sig: TAKE 1 TABLET(500 MG) BY MOUTH TWICE DAILY WITH A MEAL    Dispense:  180 tablet    Refill:  1    **Patient requests 90 days supply**   lisinopril (ZESTRIL) 20 MG tablet    Sig: Take 1 tablet (20 mg total) by mouth daily.    Dispense:  90 tablet    Refill:  1   levothyroxine (SYNTHROID) 25 MCG tablet    Sig: Take 1 tablet (25 mcg total) by mouth daily.    Dispense:  90 tablet    Refill:  2    hydrALAZINE (APRESOLINE) 50 MG tablet    Sig: Take 1 tablet (50 mg total) by mouth 3 (three) times daily.    Dispense:  270 tablet    Refill:  1   furosemide (LASIX) 20 MG tablet    Sig: Take 1 tablet (20 mg total) by mouth every other day.    Dispense:  45 tablet    Refill:  1    **Patient requests 90 days supply**   fluticasone-salmeterol (WIXELA INHUB) 250-50 MCG/ACT AEPB    Sig: Inhale 1 puff into the lungs in the morning and at bedtime.    Dispense:  180 each    Refill:  2   fluticasone (FLONASE) 50 MCG/ACT nasal spray    Sig: Place 2 sprays into both nostrils daily.    Dispense:  16 g    Refill:  6   flecainide (TAMBOCOR) 50 MG tablet    Sig: Take 1 tablet (50 mg total) by mouth 2 (two) times daily. May take an extra tablet for palpitations    Dispense:  180 tablet    Refill:  1   diltiazem (CARDIZEM CD) 240 MG 24 hr capsule    Sig: Take 1 capsule (240 mg total) by mouth daily.    Dispense:  90 capsule    Refill:  1   albuterol (VENTOLIN HFA) 108 (90 Base) MCG/ACT inhaler    Sig: Inhale 2 puffs into the lungs every 6 (six) hours as needed for wheezing or shortness of breath.    Dispense:  18 g    Refill:  3    Orders Placed This Encounter  Procedures   Comprehensive metabolic panel   Hemoglobin A1c   Lipid panel   Microalbumin / creatinine urine ratio   CBC with Differential/Platelet   TSH   Cardiovascular Risk Assessment   30 minute visit with review of records  Follow-up: Return in about 6 months (around 04/24/2022), or fasting.  An After Visit Summary was printed and given to the patient.  Reinaldo Meeker, MD [facility] [phone]

## 2021-10-26 LAB — COMPREHENSIVE METABOLIC PANEL
ALT: 14 IU/L (ref 0–32)
AST: 16 IU/L (ref 0–40)
Albumin/Globulin Ratio: 1.7 (ref 1.2–2.2)
Albumin: 4.3 g/dL (ref 3.8–4.8)
Alkaline Phosphatase: 65 IU/L (ref 44–121)
BUN/Creatinine Ratio: 23 (ref 12–28)
BUN: 25 mg/dL (ref 8–27)
Bilirubin Total: 0.2 mg/dL (ref 0.0–1.2)
CO2: 19 mmol/L — ABNORMAL LOW (ref 20–29)
Calcium: 10.4 mg/dL — ABNORMAL HIGH (ref 8.7–10.3)
Chloride: 105 mmol/L (ref 96–106)
Creatinine, Ser: 1.11 mg/dL — ABNORMAL HIGH (ref 0.57–1.00)
Globulin, Total: 2.5 g/dL (ref 1.5–4.5)
Glucose: 95 mg/dL (ref 70–99)
Potassium: 5.8 mmol/L — ABNORMAL HIGH (ref 3.5–5.2)
Sodium: 137 mmol/L (ref 134–144)
Total Protein: 6.8 g/dL (ref 6.0–8.5)
eGFR: 55 mL/min/{1.73_m2} — ABNORMAL LOW (ref >59)

## 2021-10-26 LAB — CBC WITH DIFFERENTIAL/PLATELET
Basophils Absolute: 0.1 10*3/uL (ref 0.0–0.2)
Basos: 1 %
EOS (ABSOLUTE): 0.1 10*3/uL (ref 0.0–0.4)
Eos: 1 %
Hematocrit: 39.6 % (ref 34.0–46.6)
Hemoglobin: 13 g/dL (ref 11.1–15.9)
Immature Grans (Abs): 0.1 10*3/uL (ref 0.0–0.1)
Immature Granulocytes: 1 %
Lymphocytes Absolute: 3.2 10*3/uL — ABNORMAL HIGH (ref 0.7–3.1)
Lymphs: 32 %
MCH: 28 pg (ref 26.6–33.0)
MCHC: 32.8 g/dL (ref 31.5–35.7)
MCV: 85 fL (ref 79–97)
Monocytes Absolute: 0.9 10*3/uL (ref 0.1–0.9)
Monocytes: 9 %
Neutrophils Absolute: 5.8 10*3/uL (ref 1.4–7.0)
Neutrophils: 56 %
Platelets: 250 10*3/uL (ref 150–450)
RBC: 4.65 x10E6/uL (ref 3.77–5.28)
RDW: 13.5 % (ref 11.7–15.4)
WBC: 10.1 10*3/uL (ref 3.4–10.8)

## 2021-10-26 LAB — LIPID PANEL
Chol/HDL Ratio: 3.7 ratio (ref 0.0–4.4)
Cholesterol, Total: 187 mg/dL (ref 100–199)
HDL: 50 mg/dL (ref >39)
LDL Chol Calc (NIH): 108 mg/dL — ABNORMAL HIGH (ref 0–99)
Triglycerides: 163 mg/dL — ABNORMAL HIGH (ref 0–149)
VLDL Cholesterol Cal: 29 mg/dL (ref 5–40)

## 2021-10-26 LAB — HEMOGLOBIN A1C
Est. average glucose Bld gHb Est-mCnc: 120 mg/dL
Hgb A1c MFr Bld: 5.8 % — ABNORMAL HIGH (ref 4.8–5.6)

## 2021-10-26 LAB — TSH: TSH: 2.12 u[IU]/mL (ref 0.450–4.500)

## 2021-10-26 LAB — CARDIOVASCULAR RISK ASSESSMENT

## 2021-10-27 ENCOUNTER — Encounter: Payer: Self-pay | Admitting: Legal Medicine

## 2021-10-27 NOTE — Progress Notes (Signed)
Kidney stage 3a mild, potassium high 5.8- recheck one week, calcium high, get PTH level with next draw, liver test normal, A1c 5.8 stable, triglycerides high 163, LDL cholesterol 108 high, consider change to atorvastatin 40mg , CBC normal,  TSH 2.12 normal lp

## 2021-11-04 ENCOUNTER — Other Ambulatory Visit: Payer: Medicare Other

## 2021-11-05 ENCOUNTER — Other Ambulatory Visit: Payer: Self-pay

## 2021-11-05 ENCOUNTER — Other Ambulatory Visit: Payer: Medicare Other

## 2021-11-05 DIAGNOSIS — I10 Essential (primary) hypertension: Secondary | ICD-10-CM

## 2021-11-06 LAB — COMPREHENSIVE METABOLIC PANEL
ALT: 16 IU/L (ref 0–32)
AST: 16 IU/L (ref 0–40)
Albumin/Globulin Ratio: 1.9 (ref 1.2–2.2)
Albumin: 4.5 g/dL (ref 3.8–4.8)
Alkaline Phosphatase: 68 IU/L (ref 44–121)
BUN/Creatinine Ratio: 15 (ref 12–28)
BUN: 15 mg/dL (ref 8–27)
Bilirubin Total: 0.3 mg/dL (ref 0.0–1.2)
CO2: 22 mmol/L (ref 20–29)
Calcium: 10.2 mg/dL (ref 8.7–10.3)
Chloride: 101 mmol/L (ref 96–106)
Creatinine, Ser: 1 mg/dL (ref 0.57–1.00)
Globulin, Total: 2.4 g/dL (ref 1.5–4.5)
Glucose: 98 mg/dL (ref 70–99)
Potassium: 5 mmol/L (ref 3.5–5.2)
Sodium: 145 mmol/L — ABNORMAL HIGH (ref 134–144)
Total Protein: 6.9 g/dL (ref 6.0–8.5)
eGFR: 63 mL/min/{1.73_m2} (ref >59)

## 2021-11-06 LAB — PTH, INTACT AND CALCIUM: PTH: 31 pg/mL (ref 15–65)

## 2021-11-06 NOTE — Progress Notes (Signed)
Potassium now 5.0 normal lp

## 2022-01-08 ENCOUNTER — Other Ambulatory Visit: Payer: Self-pay | Admitting: Cardiovascular Disease

## 2022-01-08 DIAGNOSIS — I10 Essential (primary) hypertension: Secondary | ICD-10-CM

## 2022-01-08 NOTE — Telephone Encounter (Signed)
Please advise if ok to refill. ?Last filled by Dr. Joenathan Sakuma Goodell. ?

## 2022-01-22 NOTE — Progress Notes (Signed)
Cardiology Office Note   Date:  01/23/2022   ID:  Lori Phelps, DOB [birth year], MRN [id number]  PCP:  Janie Morning, NP  Cardiologist:   Lorine Bears, MD   Chief Complaint  Patient presents with   Other    6 month f/u no complaints today. Meds reviewed verbally with pt.       History of Present Illness: Lori Phelps is a 66 y.o. female who presents for regarding palpitations due to atrial tachycardia and brief nonsustained atrial fibrillation.  She has type 2 diabetes, essential hypertension and hypothyroidism. She has been followed by Dr. Johney Frame in the past for arrhythmia.  Symptoms have been controlled with flecainide, diltiazem and metoprolol. Most recent echocardiogram in April 2021 showed normal LV systolic function, indeterminate diastolic function, normal pulmonary pressure and no significant valvular abnormalities.  She had atypical chest pain in the past and had a Lexiscan Myoview in September 2021 which showed no evidence of ischemia with normal ejection fraction.    She has been doing well with no recent chest pain, shortness of breath or palpitations.  She takes her medications regularly. .  Past Medical History:  Diagnosis Date   Atrial tachycardia (HCC)    Chronic atrial fibrillation (HCC)    Diabetes mellitus without complication (HCC)    Essential hypertension    GERD (gastroesophageal reflux disease)    HLD (hyperlipidemia)    HTN (hypertension)    Hx of bronchitis    uses MDI prn and on advair daily but no dx of asthma    Hypothyroidism    Mixed hyperlipidemia    Overweight    Palpitations    hx PAC"S   Phlebitis and thrombophlebitis of deep vessels of lower extremities, left (HCC)    Type 2 diabetes mellitus with other specified complication [facility])     Past Surgical History:  Procedure Laterality Date   BACK SURGERY  1994   COLONOSCOPY  2002   DILATATION & CURETTAGE/HYSTEROSCOPY WITH MYOSURE N/A 08/22/2015   Procedure: DILATATION &  CURETTAGE/HYSTEROSCOPY WITH MYOSURE AND POLYPECTOMY;  Surgeon: Huel Cote, MD;  Location: [facility];  Service: Gynecology;  Laterality: N/A;   NASAL SINUS SURGERY     [id number]     Current Outpatient Medications  Medication Sig Dispense Refill   Accu-Chek Softclix Lancets lancets Use as instructed 100 each 12   albuterol (PROVENTIL) (2.5 MG/3ML) 0.083% nebulizer solution Take 3 mLs (2.5 mg total) by nebulization every 6 (six) hours as needed for wheezing or shortness of breath. 150 mL 1   albuterol (VENTOLIN HFA) 108 (90 Base) MCG/ACT inhaler Inhale 2 puffs into the lungs every 6 (six) hours as needed for wheezing or shortness of breath. 18 g 3   aspirin EC 81 MG tablet Take 81 mg by mouth daily.     diltiazem (CARDIZEM CD) 240 MG 24 hr capsule Take 1 capsule (240 mg total) by mouth daily. 90 capsule 1   flecainide (TAMBOCOR) 50 MG tablet Take 1 tablet (50 mg total) by mouth 2 (two) times daily. May take an extra tablet for palpitations 180 tablet 1   fluticasone (FLONASE) 50 MCG/ACT nasal spray Place 2 sprays into both nostrils daily. 16 g 6   fluticasone-salmeterol (WIXELA INHUB) 250-50 MCG/ACT AEPB Inhale 1 puff into the lungs in the morning and at bedtime. 180 each 2   furosemide (LASIX) 20 MG tablet Take 1 tablet (20 mg total) by mouth every other day. 45 tablet 1   glucose  blood (ACCU-CHEK AVIVA PLUS) test strip Use as instructed 100 each 12   glucose blood test strip Use as instructed 100 each 12   hydrALAZINE (APRESOLINE) 50 MG tablet Take 1 tablet (50 mg total) by mouth 3 (three) times daily. 270 tablet 1   levothyroxine (SYNTHROID) 25 MCG tablet Take 1 tablet (25 mcg total) by mouth daily. 90 tablet 2   lisinopril (ZESTRIL) 20 MG tablet Take 1 tablet (20 mg total) by mouth daily. (Patient taking differently: Take 20 mg by mouth 2 (two) times daily.) 90 tablet 1   metFORMIN (GLUCOPHAGE) 500 MG tablet TAKE 1 TABLET(500 MG) BY MOUTH TWICE DAILY WITH A MEAL 180 tablet 1   metoprolol  tartrate (LOPRESSOR) 25 MG tablet Take 0.5 tablets (12.5 mg total) by mouth 2 (two) times daily. 45 tablet 3   Multiple Vitamin (MULTIVITAMIN) capsule Take 1 capsule by mouth daily.     Omega-3 Fatty Acids (FISH OIL) 435 MG CAPS Take 2 capsules (870 mg total) by mouth daily. 180 capsule 2   omeprazole (PRILOSEC) 20 MG capsule Take 1 capsule (20 mg total) by mouth daily. 90 capsule 2   simvastatin (ZOCOR) 20 MG tablet Take 1 tablet (20 mg total) by mouth at bedtime. 90 tablet 1   No current facility-administered medications for this visit.    Allergies:   Penicillins, Sulfur dioxide, and Sulfonamide derivatives    Social History:  The patient  reports that she has quit smoking. She has never used smokeless tobacco. She reports that she does not drink alcohol and does not use drugs.   Family History:  The patient's family history includes Heart failure in her father; Kidney disease in her mother and another family member.    ROS:  Please see the history of present illness.   Otherwise, review of systems are positive for none.   All other systems are reviewed and negative.    PHYSICAL EXAM: VS:  BP 118/70 (BP Location: Left Arm, Patient Position: Sitting, Cuff Size: Normal)   Pulse 64   Ht 5\' 1"  (1.549 m)   Wt 213 lb (96.6 kg)   LMP 05/21/2015 Comment: irregular since age 52   SpO2 97%   BMI 40.25 kg/m  , BMI Body mass index is 40.25 kg/m. GEN: Well nourished, well developed, in no acute distress  HEENT: normal  Neck: no JVD, carotid bruits, or masses Cardiac: RRR; no  rubs, or gallops,no edema .  2/ 6 systolic murmur in the aortic area which is early peaking Respiratory:  clear to auscultation bilaterally, normal work of breathing GI: soft, nontender, nondistended, + BS MS: no deformity or atrophy  Skin: warm and dry, no rash Neuro:  Strength and sensation are intact Psych: euthymic mood, full affect   EKG:  EKG is ordered today. EKG f was reviewed and showed normal sinus  rhythm with low voltage and no significant ST or T wave changes.   Recent Labs: 10/25/2021: Hemoglobin 13.0; Platelets 250; TSH 2.120 11/05/2021: ALT 16; BUN 15; Creatinine, Ser 1.00; Potassium 5.0; Sodium 145    Lipid Panel    Component Value Date/Time   CHOL 187 10/25/2021 1017   TRIG 163 (H) 10/25/2021 1017   HDL 50 10/25/2021 1017   CHOLHDL 3.7 10/25/2021 1017   LDLCALC 108 (H) 10/25/2021 1017      Wt Readings from Last 3 Encounters:  01/23/22 213 lb (96.6 kg)  10/25/21 205 lb (93 kg)  07/25/21 211 lb 6 oz (95.9 kg)  ASSESSMENT AND PLAN:  1.  Palpitations due to PACs, atrial tachycardia and nonsustained atrial fibrillation:  Symptoms are well controlled on current medications including flecainide, metoprolol and diltiazem.  She is only on small dose metoprolol 12.5 mg twice daily and thus we will discontinue this medication and continue with diltiazem and flecainide.  2. Essential hypertension: Blood pressure is well controlled on current medication.  I refilled her antihypertensive medications.  3. Diabetes mellitus:  managed by primary care physician.  4.  Hyperlipidemia: Most recent lipid profile showed an LDL of 108.  Given that she is diabetic, recommend a target LDL of less than 70.  Thus, I switched simvastatin to atorvastatin 40 mg once daily.  Check lipid and liver profile in 2 months.  5.  Cardiac murmur: She has a murmur suggestive of aortic sclerosis.  Echocardiogram in April 2021 showed no significant aortic stenosis.  Cardiac murmur is stable.   Disposition:   Follow-up in 6 months.  Signed,  Lorine BearsMuhammad Annastasia Haskins, MD  01/23/2022 8:22 AM    [facility]

## 2022-01-23 ENCOUNTER — Encounter: Payer: Self-pay | Admitting: Cardiovascular Disease

## 2022-01-23 ENCOUNTER — Ambulatory Visit (INDEPENDENT_AMBULATORY_CARE_PROVIDER_SITE_OTHER): Payer: Medicare Other | Admitting: Cardiovascular Disease

## 2022-01-23 VITALS — BP 118/70 | HR 64 | Ht 61.0 in | Wt 213.0 lb

## 2022-01-23 DIAGNOSIS — E119 Type 2 diabetes mellitus without complications: Secondary | ICD-10-CM | POA: Diagnosis not present

## 2022-01-23 DIAGNOSIS — E785 Hyperlipidemia, unspecified: Secondary | ICD-10-CM

## 2022-01-23 DIAGNOSIS — I10 Essential (primary) hypertension: Secondary | ICD-10-CM

## 2022-01-23 DIAGNOSIS — E038 Other specified hypothyroidism: Secondary | ICD-10-CM

## 2022-01-23 DIAGNOSIS — I491 Atrial premature depolarization: Secondary | ICD-10-CM

## 2022-01-23 DIAGNOSIS — R011 Cardiac murmur, unspecified: Secondary | ICD-10-CM

## 2022-01-23 MED ORDER — LISINOPRIL 20 MG PO TABS: 20.0000 mg | ORAL_TABLET | Freq: Every day | ORAL | 1 refills | Status: DC

## 2022-01-23 MED ORDER — DILTIAZEM HCL ER COATED BEADS 240 MG PO CP24: 240.0000 mg | ORAL_CAPSULE | Freq: Every day | ORAL | 1 refills | Status: DC

## 2022-01-23 MED ORDER — HYDRALAZINE HCL 50 MG PO TABS: 50.0000 mg | ORAL_TABLET | Freq: Three times a day (TID) | ORAL | 1 refills | Status: DC

## 2022-01-23 MED ORDER — FUROSEMIDE 20 MG PO TABS: 20.0000 mg | ORAL_TABLET | ORAL | 1 refills | Status: DC

## 2022-01-23 MED ORDER — ATORVASTATIN CALCIUM 40 MG PO TABS: 40.0000 mg | ORAL_TABLET | Freq: Every day | ORAL | 1 refills | Status: DC

## 2022-01-23 NOTE — Patient Instructions (Signed)
Medication Instructions:  Your physician has recommended you make the following change in your medication:   - STOP Metoprolol - STOP Simvastatin  - START Atorvastatin 40 mg daily - Lisinopril should be taken 20 mg once daily  *If you need a refill on your cardiac medications before your next appointment, please call your pharmacy*   Lab Work: Your physician recommends that you return for a FASTING lipid profile and lft: 2 months   Please have your labs drawn at the [facility]. No appt needed. Lab hours are Mon-Fri 7am-6pm. Stop at the Registration desk to check in.  If you have labs (blood work) drawn today and your tests are completely normal, you will receive your results only by: MyChart Message (if you have MyChart) OR A paper copy in the mail If you have any lab test that is abnormal or we need to change your treatment, we will call you to review the results.   Testing/Procedures: None ordered   Follow-Up: At [facility], you and your health needs are our priority.  As part of our continuing mission to provide you with exceptional heart care, we have created designated Provider Care Teams.  These Care Teams include your primary Cardiologist (physician) and Advanced Practice Providers (APPs -  Physician Assistants and Nurse Practitioners) who all work together to provide you with the care you need, when you need it.  We recommend signing up for the patient portal called "MyChart".  Sign up information is provided on this After Visit Summary.  MyChart is used to connect with patients for Virtual Visits (Telemedicine).  Patients are able to view lab/test results, encounter notes, upcoming appointments, etc.  Non-urgent messages can be sent to your provider as well.   To learn more about what you can do with MyChart, go to [email or web address].    Your next appointment:   6 month(s)  The format for your next appointment:   In Person  Provider:   You may see  Lorine Bears, MD or one of the following Advanced Practice Providers on your designated Care Team:   Nicolasa Ducking, NP Eula Listen, PA-C Cadence Fransico Michael, [place]   Other Instructions N/A  Important Information About Sugar

## 2022-02-11 ENCOUNTER — Ambulatory Visit: Payer: Medicare Other | Admitting: Nurse Practitioner

## 2022-02-12 ENCOUNTER — Encounter: Payer: Self-pay | Admitting: Nurse Practitioner

## 2022-02-12 ENCOUNTER — Ambulatory Visit (INDEPENDENT_AMBULATORY_CARE_PROVIDER_SITE_OTHER): Payer: Medicare Other | Admitting: Nurse Practitioner

## 2022-02-12 VITALS — BP 132/80 | HR 62 | Temp 97.1°F | Ht 61.0 in | Wt 214.0 lb

## 2022-02-12 DIAGNOSIS — R053 Chronic cough: Secondary | ICD-10-CM

## 2022-02-12 DIAGNOSIS — M1712 Unilateral primary osteoarthritis, left knee: Secondary | ICD-10-CM | POA: Diagnosis not present

## 2022-02-12 DIAGNOSIS — E782 Mixed hyperlipidemia: Secondary | ICD-10-CM | POA: Diagnosis not present

## 2022-02-12 DIAGNOSIS — E1165 Type 2 diabetes mellitus with hyperglycemia: Secondary | ICD-10-CM

## 2022-02-12 DIAGNOSIS — I10 Essential (primary) hypertension: Secondary | ICD-10-CM | POA: Diagnosis not present

## 2022-02-12 MED ORDER — PROMETHAZINE-DM 6.25-15 MG/5ML PO SYRP: 5.0000 mL | ORAL_SOLUTION | Freq: Four times a day (QID) | ORAL | 0 refills | Status: DC | PRN

## 2022-02-12 NOTE — Progress Notes (Deleted)
Subjective:  Patient ID: Lori Phelps, female    DOB: 1955-09-09  Age: 66 y.o. MRN: [id number]  Chief Complaint  Patient presents with   Surgical Clearance    HPI  02/12/22   Re: Lori Phelps DOB:  [birth year] MRN:  [id number]   Address: [address] [place] [address]   Chief Complaint  Patient presents with   Surgical Clearance    Lori Phelps  is here for a Pre-operative physical at the request of Dr. Deberah Castle.   She  is having left total knee arthroplasty surgery on TBA for OA  Personal or family hx of adverse outcome to anesthesia? No  Chipped, cracked, missing, or loose teeth? No  Decreased ROM of neck? No  Able to walk up 2 flights of stairs without becoming significantly short of breath or having chest pain? Yes    Patient Active Problem List   Diagnosis Date Noted   Chronic diastolic heart failure (HCC) 10/25/2021   Class 2 severe obesity with serious comorbidity and body mass index (BMI) of 39.0 to 39.9 in adult, unspecified obesity type (HCC) 10/25/2021   Left knee pain 01/01/2021   Mixed hyperlipidemia    Essential hypertension    Type 2 diabetes mellitus with other specified complication (HCC)    Hypothyroidism    Atrial tachycardia (HCC)    Past Medical History:  Diagnosis Date   Atrial tachycardia (HCC)    Chronic atrial fibrillation (HCC)    Diabetes mellitus without complication (HCC)    Essential hypertension    GERD (gastroesophageal reflux disease)    HLD (hyperlipidemia)    HTN (hypertension)    Hx of bronchitis    uses MDI prn and on advair daily but no dx of asthma    Hypothyroidism    Mixed hyperlipidemia    Overweight    Palpitations    hx PAC"S   Phlebitis and thrombophlebitis of deep vessels of lower extremities, left (HCC)    Type 2 diabetes mellitus with other specified complication [facility])     Past Surgical History:  Procedure Laterality Date   BACK SURGERY  1994   COLONOSCOPY  2002   DILATATION &  CURETTAGE/HYSTEROSCOPY WITH MYOSURE N/A 08/22/2015   Procedure: DILATATION & CURETTAGE/HYSTEROSCOPY WITH MYOSURE AND POLYPECTOMY;  Surgeon: Huel Cote, MD;  Location: [facility];  Service: Gynecology;  Laterality: N/A;   NASAL SINUS SURGERY     [id number]    Current Outpatient Medications  Medication Sig Dispense Refill   Accu-Chek Softclix Lancets lancets Use as instructed 100 each 12   albuterol (PROVENTIL) (2.5 MG/3ML) 0.083% nebulizer solution Take 3 mLs (2.5 mg total) by nebulization every 6 (six) hours as needed for wheezing or shortness of breath. 150 mL 1   albuterol (VENTOLIN HFA) 108 (90 Base) MCG/ACT inhaler Inhale 2 puffs into the lungs every 6 (six) hours as needed for wheezing or shortness of breath. 18 g 3   aspirin EC 81 MG tablet Take 81 mg by mouth daily.     atorvastatin (LIPITOR) 40 MG tablet Take 1 tablet (40 mg total) by mouth daily. 90 tablet 1   diltiazem (CARDIZEM CD) 240 MG 24 hr capsule Take 1 capsule (240 mg total) by mouth daily. 90 capsule 1   flecainide (TAMBOCOR) 50 MG tablet Take 1 tablet (50 mg total) by mouth 2 (two) times daily. May take an extra tablet for palpitations 180 tablet 1   fluticasone (FLONASE) 50 MCG/ACT nasal spray Place 2 sprays into both  nostrils daily. 16 g 6   fluticasone-salmeterol (WIXELA INHUB) 250-50 MCG/ACT AEPB Inhale 1 puff into the lungs in the morning and at bedtime. 180 each 2   furosemide (LASIX) 20 MG tablet Take 1 tablet (20 mg total) by mouth every other day. 45 tablet 1   glucose blood (ACCU-CHEK AVIVA PLUS) test strip Use as instructed 100 each 12   glucose blood test strip Use as instructed 100 each 12   hydrALAZINE (APRESOLINE) 50 MG tablet Take 1 tablet (50 mg total) by mouth 3 (three) times daily. 270 tablet 1   levothyroxine (SYNTHROID) 25 MCG tablet Take 1 tablet (25 mcg total) by mouth daily. 90 tablet 2   lisinopril (ZESTRIL) 20 MG tablet Take 1 tablet (20 mg total) by mouth daily. (Patient taking differently: Take 20  mg by mouth 2 (two) times daily.) 90 tablet 1   metFORMIN (GLUCOPHAGE) 500 MG tablet TAKE 1 TABLET(500 MG) BY MOUTH TWICE DAILY WITH A MEAL 180 tablet 1   Multiple Vitamin (MULTIVITAMIN) capsule Take 1 capsule by mouth daily.     Omega-3 Fatty Acids (FISH OIL) 435 MG CAPS Take 2 capsules (870 mg total) by mouth daily. 180 capsule 2   omeprazole (PRILOSEC) 20 MG capsule Take 1 capsule (20 mg total) by mouth daily. 90 capsule 2   No current facility-administered medications for this visit.    Allergies  Allergen Reactions   Penicillins Hives    Has patient had a PCN reaction causing immediate rash, facial/tongue/throat swelling, SOB or lightheadedness with hypotension: No Has patient had a PCN reaction causing severe rash involving mucus membranes or skin necrosis: No Has patient had a PCN reaction that required hospitalization No Has patient had a PCN reaction occurring within the last 10 years: No If all of the above answers are "NO", then may proceed with Cephalosporin use.    Sulfur Dioxide    Sulfonamide Derivatives Rash    Social History   Socioeconomic History   Marital status: Married    Spouse name: Not on file   Number of children: 2   Years of education: Not on file   Highest education level: Not on file  Occupational History   Not on file  Tobacco Use   Smoking status: Former   Smokeless tobacco: Never  Vaping Use   Vaping Use: Never used  Substance and Sexual Activity   Alcohol use: No    Alcohol/week: 0.0 standard drinks   Drug use: No   Sexual activity: Not on file  Other Topics Concern   Not on file  Social History Narrative   Pt lives in [place] with spouse.  Retired Engineer, water for [employer].   Social Determinants of Health   Financial Resource Strain: Low Risk    Difficulty of Paying Living Expenses: Not hard at all  Food Insecurity: No Food Insecurity   Worried About Programme researcher, broadcasting/film/video in the Last Year: Never true   Ran Out  of Food in the Last Year: Never true  Transportation Needs: No Transportation Needs   Lack of Transportation (Medical): No   Lack of Transportation (Non-Medical): No  Physical Activity: Inactive   Days of Exercise per Week: 0 days   Minutes of Exercise per Session: 0 min  Stress: No Stress Concern Present   Feeling of Stress : Not at all  Social Connections: Socially Integrated   Frequency of Communication with Friends and Family: More than three times a week  Frequency of Social Gatherings with Friends and Family: More than three times a week   Attends Religious Services: 1 to 4 times per year   Active Member of [employer] or Organizations: Yes   Attends Engineer, structural: More than 4 times per year   Marital Status: Married  Catering manager Violence: Not At Risk   Fear of Current or Ex-Partner: No   Emotionally Abused: No   Physically Abused: No   Sexually Abused: No    Family History  Problem Relation Age of Onset   Kidney disease Other    Kidney disease Mother    Heart failure Father    Colon cancer Neg Hx    Colon polyps Neg Hx    Esophageal cancer Neg Hx    Rectal cancer Neg Hx    Stomach cancer Neg Hx          Recommendations:  DVT prophylaxis, monitoring of blood sugars and blood pressure post operatively. A cardiac clearance is not recommended.    Please call [phone] with any additional questions.  Sincerely,    Janie Morning, NP     Chief Complaint  Patient presents with   Surgical Clearance    Lori Phelps  is here for a Pre-operative physical at the request of Dr. Deberah Castle.   She  is having left total knee arthroscopy surgery on TBD for knee pain.  Personal or family hx of adverse outcome to anesthesia? No  Chipped, cracked, missing, or loose teeth? No  Decreased ROM of neck? No  Able to walk up 2 flights of stairs without becoming significantly short of breath or having chest pain? Yes    Patient Active Problem List   Diagnosis Date  Noted   Chronic diastolic heart failure (HCC) 10/25/2021   Class 2 severe obesity with serious comorbidity and body mass index (BMI) of 39.0 to 39.9 in adult, unspecified obesity type (HCC) 10/25/2021   Left knee pain 01/01/2021   Mixed hyperlipidemia    Essential hypertension    Type 2 diabetes mellitus with other specified complication (HCC)    Hypothyroidism    Atrial tachycardia (HCC)    Past Medical History:  Diagnosis Date   Atrial tachycardia (HCC)    Chronic atrial fibrillation (HCC)    Diabetes mellitus without complication (HCC)    Essential hypertension    GERD (gastroesophageal reflux disease)    HLD (hyperlipidemia)    HTN (hypertension)    Hx of bronchitis    uses MDI prn and on advair daily but no dx of asthma    Hypothyroidism    Mixed hyperlipidemia    Overweight    Palpitations    hx PAC"S   Phlebitis and thrombophlebitis of deep vessels of lower extremities, left (HCC)    Type 2 diabetes mellitus with other specified complication [facility])     Past Surgical History:  Procedure Laterality Date   BACK SURGERY  1994   COLONOSCOPY  2002   DILATATION & CURETTAGE/HYSTEROSCOPY WITH MYOSURE N/A 08/22/2015   Procedure: DILATATION & CURETTAGE/HYSTEROSCOPY WITH MYOSURE AND POLYPECTOMY;  Surgeon: Huel Cote, MD;  Location: [facility];  Service: Gynecology;  Laterality: N/A;   NASAL SINUS SURGERY     [id number]    Current Outpatient Medications  Medication Sig Dispense Refill   Accu-Chek Softclix Lancets lancets Use as instructed 100 each 12   albuterol (PROVENTIL) (2.5 MG/3ML) 0.083% nebulizer solution Take 3 mLs (2.5 mg total) by nebulization every 6 (six) hours as needed for wheezing  or shortness of breath. 150 mL 1   albuterol (VENTOLIN HFA) 108 (90 Base) MCG/ACT inhaler Inhale 2 puffs into the lungs every 6 (six) hours as needed for wheezing or shortness of breath. 18 g 3   aspirin EC 81 MG tablet Take 81 mg by mouth daily.     atorvastatin (LIPITOR) 40 MG tablet  Take 1 tablet (40 mg total) by mouth daily. 90 tablet 1   diltiazem (CARDIZEM CD) 240 MG 24 hr capsule Take 1 capsule (240 mg total) by mouth daily. 90 capsule 1   flecainide (TAMBOCOR) 50 MG tablet Take 1 tablet (50 mg total) by mouth 2 (two) times daily. May take an extra tablet for palpitations 180 tablet 1   fluticasone (FLONASE) 50 MCG/ACT nasal spray Place 2 sprays into both nostrils daily. 16 g 6   fluticasone-salmeterol (WIXELA INHUB) 250-50 MCG/ACT AEPB Inhale 1 puff into the lungs in the morning and at bedtime. 180 each 2   furosemide (LASIX) 20 MG tablet Take 1 tablet (20 mg total) by mouth every other day. 45 tablet 1   glucose blood (ACCU-CHEK AVIVA PLUS) test strip Use as instructed 100 each 12   glucose blood test strip Use as instructed 100 each 12   hydrALAZINE (APRESOLINE) 50 MG tablet Take 1 tablet (50 mg total) by mouth 3 (three) times daily. 270 tablet 1   levothyroxine (SYNTHROID) 25 MCG tablet Take 1 tablet (25 mcg total) by mouth daily. 90 tablet 2   lisinopril (ZESTRIL) 20 MG tablet Take 1 tablet (20 mg total) by mouth daily. (Patient taking differently: Take 20 mg by mouth 2 (two) times daily.) 90 tablet 1   metFORMIN (GLUCOPHAGE) 500 MG tablet TAKE 1 TABLET(500 MG) BY MOUTH TWICE DAILY WITH A MEAL 180 tablet 1   Multiple Vitamin (MULTIVITAMIN) capsule Take 1 capsule by mouth daily.     Omega-3 Fatty Acids (FISH OIL) 435 MG CAPS Take 2 capsules (870 mg total) by mouth daily. 180 capsule 2   omeprazole (PRILOSEC) 20 MG capsule Take 1 capsule (20 mg total) by mouth daily. 90 capsule 2   No current facility-administered medications for this visit.    Allergies  Allergen Reactions   Penicillins Hives    Has patient had a PCN reaction causing immediate rash, facial/tongue/throat swelling, SOB or lightheadedness with hypotension: No Has patient had a PCN reaction causing severe rash involving mucus membranes or skin necrosis: No Has patient had a PCN reaction that required  hospitalization No Has patient had a PCN reaction occurring within the last 10 years: No If all of the above answers are "NO", then may proceed with Cephalosporin use.    Sulfur Dioxide    Sulfonamide Derivatives Rash    Social History   Socioeconomic History   Marital status: Married    Spouse name: Not on file   Number of children: 2   Years of education: Not on file   Highest education level: Not on file  Occupational History   Not on file  Tobacco Use   Smoking status: Former   Smokeless tobacco: Never  Vaping Use   Vaping Use: Never used  Substance and Sexual Activity   Alcohol use: No    Alcohol/week: 0.0 standard drinks   Drug use: No   Sexual activity: Not on file  Other Topics Concern   Not on file  Social History Narrative   Pt lives in [place] with spouse.  Retired Engineer, water for [employer].  Social Determinants of Health   Financial Resource Strain: Not on file  Food Insecurity: Not on file  Transportation Needs: Not on file  Physical Activity: Not on file  Stress: Not on file  Social Connections: Not on file  Intimate Partner Violence: Not on file    Family History  Problem Relation Age of Onset   Kidney disease Other    Kidney disease Mother    Heart failure Father    Colon cancer Neg Hx    Colon polyps Neg Hx    Esophageal cancer Neg Hx    Rectal cancer Neg Hx    Stomach cancer Neg Hx      Current Outpatient Medications on File Prior to Visit  Medication Sig Dispense Refill   Accu-Chek Softclix Lancets lancets Use as instructed 100 each 12   albuterol (PROVENTIL) (2.5 MG/3ML) 0.083% nebulizer solution Take 3 mLs (2.5 mg total) by nebulization every 6 (six) hours as needed for wheezing or shortness of breath. 150 mL 1   albuterol (VENTOLIN HFA) 108 (90 Base) MCG/ACT inhaler Inhale 2 puffs into the lungs every 6 (six) hours as needed for wheezing or shortness of breath. 18 g 3   aspirin EC 81 MG tablet Take 81 mg by mouth  daily.     atorvastatin (LIPITOR) 40 MG tablet Take 1 tablet (40 mg total) by mouth daily. 90 tablet 1   diltiazem (CARDIZEM CD) 240 MG 24 hr capsule Take 1 capsule (240 mg total) by mouth daily. 90 capsule 1   flecainide (TAMBOCOR) 50 MG tablet Take 1 tablet (50 mg total) by mouth 2 (two) times daily. May take an extra tablet for palpitations 180 tablet 1   fluticasone (FLONASE) 50 MCG/ACT nasal spray Place 2 sprays into both nostrils daily. 16 g 6   fluticasone-salmeterol (WIXELA INHUB) 250-50 MCG/ACT AEPB Inhale 1 puff into the lungs in the morning and at bedtime. 180 each 2   furosemide (LASIX) 20 MG tablet Take 1 tablet (20 mg total) by mouth every other day. 45 tablet 1   glucose blood (ACCU-CHEK AVIVA PLUS) test strip Use as instructed 100 each 12   glucose blood test strip Use as instructed 100 each 12   hydrALAZINE (APRESOLINE) 50 MG tablet Take 1 tablet (50 mg total) by mouth 3 (three) times daily. 270 tablet 1   levothyroxine (SYNTHROID) 25 MCG tablet Take 1 tablet (25 mcg total) by mouth daily. 90 tablet 2   lisinopril (ZESTRIL) 20 MG tablet Take 1 tablet (20 mg total) by mouth daily. (Patient taking differently: Take 20 mg by mouth 2 (two) times daily.) 90 tablet 1   metFORMIN (GLUCOPHAGE) 500 MG tablet TAKE 1 TABLET(500 MG) BY MOUTH TWICE DAILY WITH A MEAL 180 tablet 1   Multiple Vitamin (MULTIVITAMIN) capsule Take 1 capsule by mouth daily.     Omega-3 Fatty Acids (FISH OIL) 435 MG CAPS Take 2 capsules (870 mg total) by mouth daily. 180 capsule 2   omeprazole (PRILOSEC) 20 MG capsule Take 1 capsule (20 mg total) by mouth daily. 90 capsule 2   No current facility-administered medications on file prior to visit.   Past Medical History:  Diagnosis Date   Atrial tachycardia (HCC)    Chronic atrial fibrillation (HCC)    Diabetes mellitus without complication (HCC)    Essential hypertension    GERD (gastroesophageal reflux disease)    HLD (hyperlipidemia)    HTN (hypertension)     Hx of bronchitis    uses MDI  prn and on advair daily but no dx of asthma    Hypothyroidism    Mixed hyperlipidemia    Overweight    Palpitations    hx PAC"S   Phlebitis and thrombophlebitis of deep vessels of lower extremities, left (HCC)    Type 2 diabetes mellitus with other specified complication [facility])    Past Surgical History:  Procedure Laterality Date   BACK SURGERY  1994   COLONOSCOPY  2002   DILATATION & CURETTAGE/HYSTEROSCOPY WITH MYOSURE N/A 08/22/2015   Procedure: DILATATION & CURETTAGE/HYSTEROSCOPY WITH MYOSURE AND POLYPECTOMY;  Surgeon: Huel Cote, MD;  Location: [facility];  Service: Gynecology;  Laterality: N/A;   NASAL SINUS SURGERY     [id number]    Family History  Problem Relation Age of Onset   Kidney disease Other    Kidney disease Mother    Heart failure Father    Colon cancer Neg Hx    Colon polyps Neg Hx    Esophageal cancer Neg Hx    Rectal cancer Neg Hx    Stomach cancer Neg Hx    Social History   Socioeconomic History   Marital status: Married    Spouse name: Not on file   Number of children: 2   Years of education: Not on file   Highest education level: Not on file  Occupational History   Not on file  Tobacco Use   Smoking status: Former   Smokeless tobacco: Never  Vaping Use   Vaping Use: Never used  Substance and Sexual Activity   Alcohol use: No    Alcohol/week: 0.0 standard drinks   Drug use: No   Sexual activity: Not on file  Other Topics Concern   Not on file  Social History Narrative   Pt lives in [place] with spouse.  Retired Engineer, water for [employer].   Social Determinants of Health   Financial Resource Strain: Not on file  Food Insecurity: Not on file  Transportation Needs: Not on file  Physical Activity: Not on file  Stress: Not on file  Social Connections: Not on file    Review of Systems  Constitutional:  Negative for chills, fatigue and fever.  HENT:  Negative for congestion, ear pain,  rhinorrhea and sore throat.   Respiratory:  Negative for cough and shortness of breath.   Cardiovascular:  Negative for chest pain.  Gastrointestinal:  Negative for abdominal pain, constipation, diarrhea, nausea and vomiting.  Genitourinary:  Negative for dysuria and urgency.  Musculoskeletal:  Positive for joint swelling (knee pain). Negative for back pain and myalgias.  Neurological:  Negative for dizziness, weakness, light-headedness and headaches.  Psychiatric/Behavioral:  Negative for dysphoric mood. The patient is not nervous/anxious.     Objective:  Pulse 62   Temp (!) 97.1 F (36.2 C)   Ht  (1.549 m)   Wt 214 lb (97.1 kg)   LMP 05/21/2015 Comment: irregular since age 63   SpO2 98%   BMI 40.43 kg/m      02/12/2022    1:53 PM 01/23/2022    8:10 AM 10/25/2021    8:58 AM  BP/Weight  Systolic BP  118 811  Diastolic BP  70 60  Wt. (Lbs) 214 213 205  BMI 40.43 kg/m2 40.25 kg/m2 38.73 kg/m2    Physical Exam  Diabetic Foot Exam - Simple   No data filed      Lab Results  Component Value Date   WBC 10.1 10/25/2021   HGB  13.0 10/25/2021   HCT 39.6 10/25/2021   PLT 250 10/25/2021   GLUCOSE 98 11/05/2021   CHOL 187 10/25/2021   TRIG 163 (H) 10/25/2021   HDL 50 10/25/2021   LDLCALC 108 (H) 10/25/2021   ALT 16 11/05/2021   AST 16 11/05/2021   NA 145 (H) 11/05/2021   K 5.0 11/05/2021   CL 101 11/05/2021   CREATININE 1.00 11/05/2021   BUN 15 11/05/2021   CO2 22 11/05/2021   TSH 2.120 10/25/2021   HGBA1C 5.8 (H) 10/25/2021      Assessment & Plan:   Problem List Items Addressed This Visit   None .  No orders of the defined types were placed in this encounter.   No orders of the defined types were placed in this encounter.    Follow-up: No follow-ups on file.  An After Visit Summary was printed and given to the patient.  Janie MorningShannon J Heaton, NP [facility] [phone]

## 2022-02-12 NOTE — Progress Notes (Signed)
Established Patient Office Visit  Subjective   Patient ID: Lori Phelps, female    DOB: 1956-03-28  Age: 66 y.o. MRN: [id number]  Chief Complaint  Patient presents with   Surgical Clearance    HPI Lori Phelps is a 66 year old Caucasian female that presents for pre-surgical evaluation for left TKA to be performed by Dr Samule Dry. Pt obtained pre-operative labs, EKG, and chest x-ray at [facility] today.   Pt has chronic medical conditions of type 2 diabetes mellitus, hypertension, hyperlipidemia, and GERD. She is followed by cardiologist Dr Jenna Luo for non-sustained a-fib and atrial tachycardia. Last in-office visit 01/23/22. Current medications include Flecainide, Metoprolol, and Diltiazem. She denies chest pain, dyspnea, or palpitations.   Lori Phelps  is here for a Pre-operative physical at the request of Dr. Samule Dry.   She  is having left total knee arthroplasty surgery on TBA for OA. Personal or family hx of adverse outcome to anesthesia? No  Chipped, cracked, missing, or loose teeth? No  Decreased ROM of neck? No  Able to walk up 2 flights of stairs without becoming significantly short of breath or having chest pain? Yes   Diabetes Mellitus Type II  Lab Results  Component Value Date   HGBA1C 5.8 (H) 10/25/2021   HGBA1C 5.8 (H) 05/16/2021   HGBA1C 5.4 10/26/2020   Wt Readings from Last 3 Encounters:  02/12/22 214 lb (97.1 kg)  01/23/22 213 lb (96.6 kg)  10/25/21 205 lb (93 kg)   Last seen for diabetes 3 months ago.  Management  includes Glucophage 500 mg BID. She reports excellent compliance with treatment. Most Recent Eye Exam: July 2022 Current exercise: none Current diet habits: well balanced  Pertinent Labs: Lab Results  Component Value Date   CHOL 187 10/25/2021   HDL 50 10/25/2021   LDLCALC 108 (H) 10/25/2021   TRIG 163 (H) 10/25/2021   CHOLHDL 3.7 10/25/2021   Lab Results  Component Value Date   NA 145 (H) 11/05/2021   K 5.0 11/05/2021   CREATININE 1.00  11/05/2021   EGFR 63 11/05/2021   GFRNONAA 65 10/26/2020   GLUCOSE 98 11/05/2021     Hypertension:  She was last seen for hypertension 3 months ago.  BP at that visit was 100/60. Management includes Lisinopril 20 mg QD. She is having side effects: dry persistent cough.   Lipid/Cholesterol, Follow-up  Last lipid panel Other pertinent labs  Lab Results  Component Value Date   CHOL 187 10/25/2021   HDL 50 10/25/2021   LDLCALC 108 (H) 10/25/2021   TRIG 163 (H) 10/25/2021   CHOLHDL 3.7 10/25/2021   Lab Results  Component Value Date   ALT 16 11/05/2021   AST 16 11/05/2021   PLT 250 10/25/2021   TSH 2.120 10/25/2021     She was last seen for this 3 months ago.  Management includes Lipitor 40 mg QD.    Patient Active Problem List   Diagnosis Date Noted   Class 2 severe obesity with serious comorbidity and body mass index (BMI) of 39.0 to 39.9 in adult, unspecified obesity type ([place]) 10/25/2021   Left knee pain 01/01/2021   Mixed hyperlipidemia    Essential hypertension    Type 2 diabetes mellitus with other specified complication (HCC)    Hypothyroidism    Atrial tachycardia ([place])    Past Medical History:  Diagnosis Date   Atrial tachycardia ([place])    Chronic atrial fibrillation ([place])    Diabetes mellitus without complication ([place])    Essential  hypertension    GERD (gastroesophageal reflux disease)    HLD (hyperlipidemia)    HTN (hypertension)    Hx of bronchitis    uses MDI prn and on advair daily but no dx of asthma    Hypothyroidism    Mixed hyperlipidemia    Overweight    Palpitations    hx PAC"S   Phlebitis and thrombophlebitis of deep vessels of lower extremities, left ([place])    Type 2 diabetes mellitus with other specified complication [facility])     Past Surgical History:  Procedure Laterality Date   BACK SURGERY  1994   COLONOSCOPY  2002   DILATATION & CURETTAGE/HYSTEROSCOPY WITH MYOSURE N/A 08/22/2015   Procedure: DILATATION & CURETTAGE/HYSTEROSCOPY WITH  MYOSURE AND POLYPECTOMY;  Surgeon: Paula Compton, MD;  Location: [facility];  Service: Gynecology;  Laterality: N/A;   NASAL SINUS SURGERY     [id number]    Current Outpatient Medications  Medication Sig Dispense Refill   promethazine-dextromethorphan (PROMETHAZINE-DM) 6.25-15 MG/5ML syrup Take 5 mLs by mouth 4 (four) times daily as needed. 118 mL 0   Accu-Chek Softclix Lancets lancets Use as instructed 100 each 12   albuterol (PROVENTIL) (2.5 MG/3ML) 0.083% nebulizer solution Take 3 mLs (2.5 mg total) by nebulization every 6 (six) hours as needed for wheezing or shortness of breath. 150 mL 1   albuterol (VENTOLIN HFA) 108 (90 Base) MCG/ACT inhaler Inhale 2 puffs into the lungs every 6 (six) hours as needed for wheezing or shortness of breath. 18 g 3   aspirin EC 81 MG tablet Take 81 mg by mouth daily.     atorvastatin (LIPITOR) 40 MG tablet Take 1 tablet (40 mg total) by mouth daily. 90 tablet 1   diltiazem (CARDIZEM CD) 240 MG 24 hr capsule Take 1 capsule (240 mg total) by mouth daily. 90 capsule 1   flecainide (TAMBOCOR) 50 MG tablet Take 1 tablet (50 mg total) by mouth 2 (two) times daily. May take an extra tablet for palpitations 180 tablet 1   fluticasone (FLONASE) 50 MCG/ACT nasal spray Place 2 sprays into both nostrils daily. 16 g 6   fluticasone-salmeterol (WIXELA INHUB) 250-50 MCG/ACT AEPB Inhale 1 puff into the lungs in the morning and at bedtime. 180 each 2   furosemide (LASIX) 20 MG tablet Take 1 tablet (20 mg total) by mouth every other day. 45 tablet 1   glucose blood (ACCU-CHEK AVIVA PLUS) test strip Use as instructed 100 each 12   glucose blood test strip Use as instructed 100 each 12   hydrALAZINE (APRESOLINE) 50 MG tablet Take 1 tablet (50 mg total) by mouth 3 (three) times daily. 270 tablet 1   levothyroxine (SYNTHROID) 25 MCG tablet Take 1 tablet (25 mcg total) by mouth daily. 90 tablet 2   lisinopril (ZESTRIL) 20 MG tablet Take 1 tablet (20 mg total) by mouth daily. (Patient  taking differently: Take 20 mg by mouth 2 (two) times daily.) 90 tablet 1   metFORMIN (GLUCOPHAGE) 500 MG tablet TAKE 1 TABLET(500 MG) BY MOUTH TWICE DAILY WITH A MEAL 180 tablet 1   Multiple Vitamin (MULTIVITAMIN) capsule Take 1 capsule by mouth daily.     Omega-3 Fatty Acids (FISH OIL) 435 MG CAPS Take 2 capsules (870 mg total) by mouth daily. 180 capsule 2   omeprazole (PRILOSEC) 20 MG capsule Take 1 capsule (20 mg total) by mouth daily. 90 capsule 2   No current facility-administered medications for this visit.    Allergies  Allergen Reactions  Penicillins Hives    Has patient had a PCN reaction causing immediate rash, facial/tongue/throat swelling, SOB or lightheadedness with hypotension: No Has patient had a PCN reaction causing severe rash involving mucus membranes or skin necrosis: No Has patient had a PCN reaction that required hospitalization No Has patient had a PCN reaction occurring within the last 10 years: No If all of the above answers are "NO", then may proceed with Cephalosporin use.    Sulfur Dioxide    Sulfonamide Derivatives Rash    Social History   Socioeconomic History   Marital status: Married    Spouse name: Not on file   Number of children: 2   Years of education: Not on file   Highest education level: Not on file  Occupational History   Not on file  Tobacco Use   Smoking status: Former   Smokeless tobacco: Never  Vaping Use   Vaping Use: Never used  Substance and Sexual Activity   Alcohol use: No    Alcohol/week: 0.0 standard drinks   Drug use: No   Sexual activity: Not on file  Other Topics Concern   Not on file  Social History Narrative   Pt lives in [place] with spouse.  Retired Landscape architect for [employer].   Social Determinants of Health   Financial Resource Strain: Low Risk    Difficulty of Paying Living Expenses: Not hard at all  Food Insecurity: No Food Insecurity   Worried About Charity fundraiser in the Last  Year: Never true   [place] in the Last Year: Never true  Transportation Needs: No Transportation Needs   Lack of Transportation (Medical): No   Lack of Transportation (Non-Medical): No  Physical Activity: Inactive   Days of Exercise per Week: 0 days   Minutes of Exercise per Session: 0 min  Stress: No Stress Concern Present   Feeling of Stress : Not at all  Social Connections: Socially Integrated   Frequency of Communication with Friends and Family: More than three times a week   Frequency of Social Gatherings with Friends and Family: More than three times a week   Attends Religious Services: 1 to 4 times per year   Active Member of [employer] or Organizations: Yes   Attends Music therapist: More than 4 times per year   Marital Status: Married  Human resources officer Violence: Not At Risk   Fear of Current or Ex-Partner: No   Emotionally Abused: No   Physically Abused: No   Sexually Abused: No    Family History  Problem Relation Age of Onset   Kidney disease Other    Kidney disease Mother    Heart failure Father    Colon cancer Neg Hx    Colon polyps Neg Hx    Esophageal cancer Neg Hx    Rectal cancer Neg Hx    Stomach cancer Neg Hx       Review of Systems  Respiratory:  Positive for cough (chronic).   Musculoskeletal:  Positive for joint pain (left knee).     Objective:     BP 132/80   Pulse 62   Temp (!) 97.1 F (36.2 C)   Ht _0  (1.549 m)   Wt 214 lb (97.1 kg)   LMP 05/21/2015 Comment: irregular since age 53   SpO2 98%   BMI 40.43 kg/m   BP Readings from Last 3 Encounters:  02/12/22 132/80  01/23/22 118/70  10/25/21 100/60  Physical Exam Vitals reviewed.  HENT:     Head: Normocephalic.     Right Ear: Tympanic membrane normal.     Left Ear: Tympanic membrane normal.     Nose: Nose normal.     Mouth/Throat:     Mouth: Mucous membranes are moist.  Cardiovascular:     Rate and Rhythm: Normal rate and regular rhythm.     Pulses:  Normal pulses.     Heart sounds: Normal heart sounds.  Pulmonary:     Effort: Pulmonary effort is normal.     Breath sounds: Normal breath sounds.  Abdominal:     General: Bowel sounds are normal.     Palpations: Abdomen is soft.  Musculoskeletal:        General: Tenderness (Left knee) present.  Skin:    General: Skin is warm and dry.     Capillary Refill: Capillary refill takes less than 2 seconds.  Neurological:     General: No focal deficit present.     Mental Status: She is alert and oriented to person, place, and time.  Psychiatric:        Mood and Affect: Mood normal.        Behavior: Behavior normal.        Assessment & Plan:   1. Primary osteoarthritis of left knee  2. Chronic cough - promethazine-dextromethorphan (PROMETHAZINE-DM) 6.25-15 MG/5ML syrup; Take 5 mLs by mouth 4 (four) times daily as needed.  Dispense: 118 mL; Refill: 0  3. Type 2 diabetes mellitus with hyperglycemia, without long-term current use of insulin (HCC)-well controlled  4. Essential hypertension-well controlled  5. Mixed hyperlipidemia-well controlled  Pt's chronic medical conditions currently well controlled. Proceed with left total knee arthroplasty as planned.      Follow-up: 04/24/22 at 9:00   I, Rip Harbour, NP, have reviewed all documentation for this visit. The documentation on 02/12/22 for the exam, diagnosis, procedures, and orders are all accurate and complete.   Signed, Rip Harbour, NP

## 2022-02-12 NOTE — Patient Instructions (Signed)
Continue medications  We will fax notes to Dr Deberah Castle   Safety Before and After Surgery Your health care providers, including your surgical team, take many precautions to keep you safe during surgery. The following information explains the steps that your health care providers take to prevent surgical error and to reduce the risk of complications. It also lists some things that you, your friends, and your family members can do to ensure a safe surgery. What is surgical error? Surgical error is when a mistake is made during a procedure. Types of surgical error include: Operating on the wrong person. Operating on the wrong body part. Performing the wrong operation. Making a medicine mistake. Making a surgical mistake. What is a complication? A complication is a change in the normal surgical plan of care. Complications are known risks that, while rare, can occur during or after surgery. Examples of complications include: Infection. Excess bleeding. Inhaling food or liquids from your stomach into your lungs (aspiration). Allergic reaction to medicines. Review all risks and complications with your health care provider as part of the informed consent procedure before your surgery. Ask questions if you do not understand something. What steps are taken to prevent surgical error and complications? Your health care providers, including your surgical team, take many steps before and during a procedure to reduce the risk of error and complication. These steps include: Placing an identification bracelet on your wrist and checking it often. Checking the surgical consent form to make sure it is correct. Reviewing the surgical consent form with you to make sure that you understand the procedure. Reviewing your medicines and history of allergies to prevent drug reactions and interactions. Marking the surgery site before the procedure. Washing and disinfecting the surgery site before the procedure to prevent  infection. Giving you a list of safety steps to take before surgery. This usually includes what foods or medicines to avoid and any lifestyle changes you should make before the procedure, such as quitting smoking. How can I help to promote a safe surgery? One way you can help to prevent surgical error is by choosing a surgeon and facility that is right for you. To do this: Ask how many times your surgeon has performed the surgery you will be having. Ask how often the surgery is done in the hospital where you will have the procedure. Choose a hospital that is licensed, accredited, and has a system in place in case of an emergency. Here are some other steps you can take: Talk to your anesthesia provider about your medicines, allergies, any drug use, and any previous drug or anesthesia reactions you have had. Be honest. Review the surgical consent form. Make sure you understand it completely. Make sure that the correct area of your body is marked as the surgery site. Your surgeon will mark the surgery site before the surgery. Do not eat any food or drink before surgery if your surgeon has instructed you not to. Follow all instructions about eating and drinking. Keep the surgical area clean. Follow instructions if you are asked to shower with an antibacterial soap. Follow all of your home care instructions. Wash your hands often with soap and water for at least 20 seconds, and especially before bandage (dressing) changes. How can my friends or family members promote a safe surgery? Friends and family can help by: Going with you to your medical appointments before your surgery to make sure you understand the procedure. Reviewing the safety steps that you need to take before  surgery and making sure that you follow them. Knowing what surgery you are scheduled to have. Going with you to the procedure. Double-checking that the correct body part is marked before surgery. Advocating for you on your behalf  if you cannot do so. Following the home care instructions for after the surgery to help you heal well. Questions to ask before surgery Why do I need surgery? Is another treatment available? What are the risks and benefits of the surgery? Do I have a choice of anesthesia? What should I expect after surgery? Can I eat and drink after surgery? How much pain will I have? When can I go back to work? What is your experience with this type of surgery? Is the surgery center accredited? Do you take my insurance? How much will the surgery cost? Where to find more information [employer], "Preparing for Your Operation and Recovery": www.facs.org Centers for Disease Control and Prevention, "What You Should Know Before Your Surgery": [employer]Ex[employer].com.br Summary Your surgical team takes specific precautions to prevent surgical errors and complications. You can take steps to promote a safe surgery. Ask questions before your procedure, and make sure that you understand what will happen. Make sure that you understand your instructions for home care. This information is not intended to replace advice given to you by your health care provider. Make sure you discuss any questions you have with your health care provider. Document Revised: 11/05/2020 Document Reviewed: 11/05/2020 Elsevier Patient Education  2023 [employer].

## 2022-02-14 ENCOUNTER — Telehealth: Payer: Self-pay | Admitting: Cardiovascular Disease

## 2022-02-14 NOTE — Telephone Encounter (Signed)
   Pre-operative Risk Assessment    Patient Name: Lori Phelps  DOB: May 18, 1956 MRN: [id number]      Request for Surgical Clearance    Procedure:  Left total knee Arthroplasty  Date of Surgery:  Clearance TBD                                 Surgeon:  Linda Hedges Surgeon's Group or Practice Name:  [facility] Phone number:  [phone] Fax number:  [phone]   Type of Clearance Requested:   - Medical    Type of Anesthesia:  General    Additional requests/questions:    Signed, Pilar A Ham   02/14/2022, 4:16 PM

## 2022-02-17 NOTE — Telephone Encounter (Signed)
Primary Cardiologist:Muhammad Kirke Corin, MD  Chart reviewed as part of pre-operative protocol coverage. Because of Lori Phelps's past medical history and time since last visit, he/she will require a virtual visit/telephone call in order to better assess preoperative cardiovascular risk.  Pre-op covering staff: - Please contact patient, obtain consent, and schedule appointment   If applicable, this message will also be routed to pharmacy pool and/or primary cardiologist for input on holding anticoagulant/antiplatelet agent as requested below so that this information is available at time of patient's appointment.   Lori Aland, NP-C    02/17/2022, 8:39 AM [facility] [address] Office [phone] Fax [phone]

## 2022-02-17 NOTE — Telephone Encounter (Signed)
I s/w the pt this morning in regard to scheduling a tele pre op appt. Pt tells me the last time she did a telephone appt her insurance did not cover it and she had to pay $200. Pt said she just saw Dr. Kirke Corin 01/23/22. Stated she informed MD that she was going to have knee surgery and she said he told her she was cleared. I reviewed the chart and I do not see though MD noted the pt was cleared for the knee surgery.   I stated to the pt since her insurance company did not pay the last time for a tele appt, let me see what I can do. I will reach out to the pre op provider and we may be able to reach out to Dr. Kirke Corin to see if he will provide clearance. Pt does tell me she will do the tele appt if she has and will just have to pay $ 200 again. I assured her that I will see what we can do before we set tele appt. Pt thanked me for the help in this matter.

## 2022-02-19 NOTE — Telephone Encounter (Signed)
Lori Phelps, This patient was seen by me in May and was stable with no cardiac symptoms.  Is there any particular reason that you felt that she needed a virtual visit for clearance?  From my standpoint, she can proceed with surgery at an overall low risk.

## 2022-02-20 NOTE — Telephone Encounter (Signed)
   Primary Cardiologist: Lorine Bears, MD  Chart reviewed as part of pre-operative protocol coverage. Given past medical history and time since last visit, based on ACC/AHA guidelines, Lori Phelps would be at acceptable risk for the planned procedure without further cardiovascular testing.   I will route this recommendation to the requesting party via Epic fax function and remove from pre-op pool.  Please call with questions.  Levi Aland, NP-C    02/20/2022, 6:18 AM [facility] [address], [address] Office [phone] Fax [phone]

## 2022-02-20 NOTE — Telephone Encounter (Signed)
   Primary Cardiologist: Lorine Bears, MD  Chart reviewed as part of pre-operative protocol coverage. Given past medical history and time since last visit, based on ACC/AHA guidelines, Lori Phelps would be at acceptable risk for the planned procedure without further cardiovascular testing.   I called patient and apologized for the confusion regarding her clearance. I advised her that if she develops new symptoms prior to surgery to contact our office to arrange a follow-up appointment.  She verbalized understanding.  I will route this recommendation to the requesting party via Epic fax function and remove from pre-op pool.  Please call with questions.  Levi Aland, NP-C    02/20/2022, 9:09 AM [facility] [address] Office [phone] Fax [phone]

## 2022-03-14 ENCOUNTER — Telehealth: Payer: Self-pay | Admitting: Cardiovascular Disease

## 2022-03-14 NOTE — Telephone Encounter (Signed)
Noted  

## 2022-03-14 NOTE — Telephone Encounter (Signed)
Pt called stating she will go get her labs done after her knee replacement next week.

## 2022-03-14 NOTE — Telephone Encounter (Signed)
LMOV to remind patient of upcoming lab orders needed [facility]

## 2022-03-25 ENCOUNTER — Telehealth: Payer: Self-pay | Admitting: Nurse Practitioner

## 2022-03-25 NOTE — Telephone Encounter (Signed)
left vm to rs appt due to provider not in office  

## 2022-04-03 LAB — HM MAMMOGRAPHY

## 2022-04-24 ENCOUNTER — Ambulatory Visit: Payer: Medicare Other | Admitting: Nurse Practitioner

## 2022-06-15 ENCOUNTER — Other Ambulatory Visit: Payer: Self-pay | Admitting: Legal Medicine

## 2022-06-15 DIAGNOSIS — E782 Mixed hyperlipidemia: Secondary | ICD-10-CM

## 2022-07-21 ENCOUNTER — Ambulatory Visit (INDEPENDENT_AMBULATORY_CARE_PROVIDER_SITE_OTHER): Payer: Medicare Other

## 2022-07-21 DIAGNOSIS — Z23 Encounter for immunization: Secondary | ICD-10-CM

## 2022-07-21 NOTE — Progress Notes (Signed)
   COVID Vaccine given per order, patient tolerated well.   Jacklynn Bue, LPN 5:40 AM

## 2022-08-07 ENCOUNTER — Ambulatory Visit: Payer: Medicare Other | Attending: Cardiovascular Disease | Admitting: Cardiovascular Disease

## 2022-08-07 ENCOUNTER — Encounter: Payer: Self-pay | Admitting: Cardiovascular Disease

## 2022-08-07 VITALS — BP 110/60 | HR 68 | Ht 61.0 in | Wt 202.0 lb

## 2022-08-07 DIAGNOSIS — I491 Atrial premature depolarization: Secondary | ICD-10-CM

## 2022-08-07 DIAGNOSIS — I10 Essential (primary) hypertension: Secondary | ICD-10-CM

## 2022-08-07 DIAGNOSIS — R011 Cardiac murmur, unspecified: Secondary | ICD-10-CM

## 2022-08-07 DIAGNOSIS — I4719 Other supraventricular tachycardia: Secondary | ICD-10-CM

## 2022-08-07 DIAGNOSIS — E785 Hyperlipidemia, unspecified: Secondary | ICD-10-CM

## 2022-08-07 MED ORDER — HYDRALAZINE HCL 50 MG PO TABS: 50.0000 mg | ORAL_TABLET | Freq: Three times a day (TID) | ORAL | 1 refills | Status: DC

## 2022-08-07 MED ORDER — METOPROLOL TARTRATE 25 MG PO TABS: 12.5000 mg | ORAL_TABLET | Freq: Two times a day (BID) | ORAL | 3 refills | Status: DC

## 2022-08-07 MED ORDER — FUROSEMIDE 20 MG PO TABS: 20.0000 mg | ORAL_TABLET | ORAL | 1 refills | Status: DC

## 2022-08-07 MED ORDER — FLECAINIDE ACETATE 50 MG PO TABS: 50.0000 mg | ORAL_TABLET | Freq: Two times a day (BID) | ORAL | 1 refills | Status: DC

## 2022-08-07 MED ORDER — DILTIAZEM HCL ER COATED BEADS 240 MG PO CP24: 240.0000 mg | ORAL_CAPSULE | Freq: Every day | ORAL | 1 refills | Status: DC

## 2022-08-07 MED ORDER — ATORVASTATIN CALCIUM 40 MG PO TABS: 40.0000 mg | ORAL_TABLET | Freq: Every day | ORAL | 3 refills | Status: DC

## 2022-08-07 NOTE — Patient Instructions (Signed)
Medication Instructions:  STOP the Simvastatin  START Atorvastatin 40 mg once daily  *If you need a refill on your cardiac medications before your next appointment, please call your pharmacy*   Lab Work: None ordered If you have labs (blood work) drawn today and your tests are completely normal, you will receive your results only by: MyChart Message (if you have MyChart) OR A paper copy in the mail If you have any lab test that is abnormal or we need to change your treatment, we will call you to review the results.   Testing/Procedures: None ordered   Follow-Up: At [facility], you and your health needs are our priority.  As part of our continuing mission to provide you with exceptional heart care, we have created designated Provider Care Teams.  These Care Teams include your primary Cardiologist (physician) and Advanced Practice Providers (APPs -  Physician Assistants and Nurse Practitioners) who all work together to provide you with the care you need, when you need it.  We recommend signing up for the patient portal called "MyChart".  Sign up information is provided on this After Visit Summary.  MyChart is used to connect with patients for Virtual Visits (Telemedicine).  Patients are able to view lab/test results, encounter notes, upcoming appointments, etc.  Non-urgent messages can be sent to your provider as well.   To learn more about what you can do with MyChart, go to [email or web address].    Your next appointment:   6 month(s)  The format for your next appointment:   In Person  Provider:   You may see Lorine Bears, MD or one of the following Advanced Practice Providers on your designated Care Team:   Nicolasa Ducking, NP Eula Listen, PA-C Cadence Fransico Michael, PA-C Charlsie Quest, NP

## 2022-08-07 NOTE — Addendum Note (Signed)
Addended by: Ihor Dow on: 08/07/2022 04:31 PM   Modules accepted: Orders

## 2022-08-07 NOTE — Progress Notes (Signed)
Cardiology Office Note   Date:  08/07/2022   ID:  Gayleen, Sholtz [birth year], MRN [id number]  PCP:  Janie Morning, NP  Cardiologist:   Lorine Bears, MD   Chief Complaint  Patient presents with   Follow-up    6 month F/U-No new cardiac concerns       History of Present Illness: Lori Phelps is a 66 y.o. female who presents for regarding palpitations due to atrial tachycardia and brief nonsustained atrial fibrillation.  She has type 2 diabetes, essential hypertension and hypothyroidism. She was seen in the past by Dr. Johney Frame for arrhythmia management.    Symptoms have been controlled with flecainide, diltiazem and metoprolol.  It was felt that the majority of her rhythm issues were related to atrial tachycardia and not atrial fibrillation and thus anticoagulation has not been recommended. Most recent echocardiogram in April 2021 showed normal LV systolic function, indeterminate diastolic function, normal pulmonary pressure and no significant valvular abnormalities.  She had atypical chest pain in the past and had a Lexiscan Myoview in September 2021 which showed no evidence of ischemia with normal ejection fraction.    She was on small dose metoprolol which was discontinued during last visit.  However, she felt worse without the medication and resumed half a tablet daily.  I switched her from simvastatin to atorvastatin during last visit but she did not make the switch as she was concerned about possible renal toxicity. She underwent left knee replacement in July without complications.  She denies chest pain or shortness of breath.  She has minimal palpitations at the present time. .  Past Medical History:  Diagnosis Date   Atrial tachycardia    Chronic atrial fibrillation (HCC)    Diabetes mellitus without complication (HCC)    Essential hypertension    GERD (gastroesophageal reflux disease)    HLD (hyperlipidemia)    HTN (hypertension)    Hx of bronchitis     uses MDI prn and on advair daily but no dx of asthma    Hypothyroidism    Mixed hyperlipidemia    Overweight    Palpitations    hx PAC"S   Phlebitis and thrombophlebitis of deep vessels of lower extremities, left (HCC)    Type 2 diabetes mellitus with other specified complication [facility])     Past Surgical History:  Procedure Laterality Date   BACK SURGERY  09/08/1992   COLONOSCOPY  09/08/2000   DILATATION & CURETTAGE/HYSTEROSCOPY WITH MYOSURE N/A 08/22/2015   Procedure: DILATATION & CURETTAGE/HYSTEROSCOPY WITH MYOSURE AND POLYPECTOMY;  Surgeon: Huel Cote, MD;  Location: [facility];  Service: Gynecology;  Laterality: N/A;   NASAL SINUS SURGERY     [id number]   REPLACEMENT TOTAL KNEE Left    03/2022     Current Outpatient Medications  Medication Sig Dispense Refill   Accu-Chek Softclix Lancets lancets Use as instructed 100 each 12   albuterol (PROVENTIL) (2.5 MG/3ML) 0.083% nebulizer solution Take 3 mLs (2.5 mg total) by nebulization every 6 (six) hours as needed for wheezing or shortness of breath. 150 mL 1   albuterol (VENTOLIN HFA) 108 (90 Base) MCG/ACT inhaler Inhale 2 puffs into the lungs every 6 (six) hours as needed for wheezing or shortness of breath. 18 g 3   aspirin EC 81 MG tablet Take 81 mg by mouth daily.     fluticasone (FLONASE) 50 MCG/ACT nasal spray Place 2 sprays into both nostrils daily. 16 g 6   fluticasone-salmeterol (WIXELA INHUB) 250-50 MCG/ACT  AEPB Inhale 1 puff into the lungs in the morning and at bedtime. 180 each 2   glucose blood (ACCU-CHEK AVIVA PLUS) test strip Use as instructed 100 each 12   glucose blood test strip Use as instructed 100 each 12   levothyroxine (SYNTHROID) 25 MCG tablet Take 1 tablet (25 mcg total) by mouth daily. 90 tablet 2   lisinopril (ZESTRIL) 20 MG tablet Take 10 mg by mouth 2 (two) times daily.     metFORMIN (GLUCOPHAGE) 500 MG tablet TAKE 1 TABLET(500 MG) BY MOUTH TWICE DAILY WITH A MEAL 180 tablet 1   metoprolol tartrate  (LOPRESSOR) 25 MG tablet Take 12.5 mg by mouth daily.     Multiple Vitamin (MULTIVITAMIN) capsule Take 1 capsule by mouth daily.     Omega-3 Fatty Acids (FISH OIL) 435 MG CAPS Take 2 capsules (870 mg total) by mouth daily. 180 capsule 2   omeprazole (PRILOSEC) 20 MG capsule Take 1 capsule (20 mg total) by mouth daily. 90 capsule 2   promethazine-dextromethorphan (PROMETHAZINE-DM) 6.25-15 MG/5ML syrup Take 5 mLs by mouth 4 (four) times daily as needed. 118 mL 0   atorvastatin (LIPITOR) 40 MG tablet Take 1 tablet (40 mg total) by mouth daily. 90 tablet 3   diltiazem (CARDIZEM CD) 240 MG 24 hr capsule Take 1 capsule (240 mg total) by mouth daily. 90 capsule 1   flecainide (TAMBOCOR) 50 MG tablet Take 1 tablet (50 mg total) by mouth 2 (two) times daily. May take an extra tablet for palpitations 180 tablet 1   furosemide (LASIX) 20 MG tablet Take 1 tablet (20 mg total) by mouth every other day. 45 tablet 1   hydrALAZINE (APRESOLINE) 50 MG tablet Take 1 tablet (50 mg total) by mouth 3 (three) times daily. 270 tablet 1   No current facility-administered medications for this visit.    Allergies:   Penicillins, Sulfur dioxide, and Sulfonamide derivatives    Social History:  The patient  reports that she has quit smoking. She has never used smokeless tobacco. She reports that she does not drink alcohol and does not use drugs.   Family History:  The patient's family history includes Heart failure in her father; Kidney disease in her mother and another family member.    ROS:  Please see the history of present illness.   Otherwise, review of systems are positive for none.   All other systems are reviewed and negative.    PHYSICAL EXAM: VS:  BP 110/60 (BP Location: Left Arm, Patient Position: Sitting, Cuff Size: Large)   Pulse 68   Ht 5\' 1"  (1.549 m)   Wt 202 lb (91.6 kg)   LMP 05/21/2015 Comment: irregular since age 61   SpO2 98%   BMI 38.17 kg/m  , BMI Body mass index is 38.17 kg/m. GEN: Well  nourished, well developed, in no acute distress  HEENT: normal  Neck: no JVD, carotid bruits, or masses Cardiac: RRR; no  rubs, or gallops,no edema .  2/ 6 systolic murmur in the aortic area which is early peaking Respiratory:  clear to auscultation bilaterally, normal work of breathing GI: soft, nontender, nondistended, + BS MS: no deformity or atrophy  Skin: warm and dry, no rash Neuro:  Strength and sensation are intact Psych: euthymic mood, full affect   EKG:  EKG is ordered today. EKG f was reviewed and showed normal sinus rhythm with no significant ST or T wave changes.  Sinus arrhythmia.   Recent Labs: 10/25/2021: Hemoglobin 13.0; Platelets  250; TSH 2.120 11/05/2021: ALT 16; BUN 15; Creatinine, Ser 1.00; Potassium 5.0; Sodium 145    Lipid Panel    Component Value Date/Time   CHOL 187 10/25/2021 1017   TRIG 163 (H) 10/25/2021 1017   HDL 50 10/25/2021 1017   CHOLHDL 3.7 10/25/2021 1017   LDLCALC 108 (H) 10/25/2021 1017      Wt Readings from Last 3 Encounters:  08/07/22 202 lb (91.6 kg)  02/12/22 214 lb (97.1 kg)  01/23/22 213 lb (96.6 kg)       ASSESSMENT AND PLAN:  1.  Palpitations due to PACs, atrial tachycardia and nonsustained atrial fibrillation:  Symptoms are well controlled on current medications including flecainide, metoprolol and diltiazem.  Metoprolol was discontinued during last visit but she felt worse and went back on the medication which should be fine.  This was refilled.  No plans for anticoagulation at the present time as the majority of her rhythm issues were felt to be due to atrial tachycardia and not atrial fibrillation.  2. Essential hypertension: Blood pressure is well controlled on current medication.  I refilled her antihypertensive medications.  3. Diabetes mellitus:  managed by primary care physician.  4.  Hyperlipidemia: I again discussed with her the rationale of switching simvastatin to atorvastatin given that her LDL was above 100 and  she is diabetic.  She was concerned about renal side effects of atorvastatin but I reassured her.   5.  Cardiac murmur: She has a murmur suggestive of aortic sclerosis.  Echocardiogram in April 2021 showed no significant aortic stenosis.  Will plan repeat echocardiogram next year.   Disposition:   Follow-up in 6 months.  Signed,  Lorine Bears, MD  08/07/2022 8:47 AM    [facility]

## 2022-09-11 ENCOUNTER — Encounter: Payer: Self-pay | Admitting: Cardiovascular Disease

## 2022-09-16 ENCOUNTER — Encounter: Payer: Self-pay | Admitting: Nurse Practitioner

## 2022-10-23 ENCOUNTER — Telehealth: Payer: Self-pay

## 2022-10-23 NOTE — Telephone Encounter (Signed)
Left message for patient to call back to schedule Medicare Annual Wellness Visit   No hx of AWV eligible as of 09/08/22  Please schedule with Rhae Hammock, LPN if patient calls the office back.    63 Minutes appointment   Any questions, please call me at [phone]

## 2022-10-23 NOTE — Progress Notes (Signed)
error 

## 2022-10-30 ENCOUNTER — Ambulatory Visit: Payer: Medicare Other | Admitting: Physician Assistant

## 2022-12-16 ENCOUNTER — Other Ambulatory Visit: Payer: Self-pay

## 2022-12-16 DIAGNOSIS — E1169 Type 2 diabetes mellitus with other specified complication: Secondary | ICD-10-CM

## 2022-12-16 DIAGNOSIS — K2100 Gastro-esophageal reflux disease with esophagitis, without bleeding: Secondary | ICD-10-CM

## 2022-12-16 MED ORDER — METFORMIN HCL 500 MG PO TABS: ORAL_TABLET | ORAL | 1 refills | Status: DC

## 2022-12-16 MED ORDER — OMEPRAZOLE 20 MG PO CPDR: 20.0000 mg | DELAYED_RELEASE_CAPSULE | Freq: Every day | ORAL | 1 refills | Status: DC

## 2023-01-11 ENCOUNTER — Other Ambulatory Visit: Payer: Self-pay

## 2023-01-11 DIAGNOSIS — I10 Essential (primary) hypertension: Secondary | ICD-10-CM

## 2023-01-11 DIAGNOSIS — E1165 Type 2 diabetes mellitus with hyperglycemia: Secondary | ICD-10-CM

## 2023-01-11 DIAGNOSIS — E782 Mixed hyperlipidemia: Secondary | ICD-10-CM

## 2023-01-12 ENCOUNTER — Other Ambulatory Visit: Payer: Self-pay

## 2023-01-12 DIAGNOSIS — E038 Other specified hypothyroidism: Secondary | ICD-10-CM

## 2023-01-12 MED ORDER — LEVOTHYROXINE SODIUM 25 MCG PO TABS: 25.0000 ug | ORAL_TABLET | Freq: Every day | ORAL | 0 refills | Status: DC

## 2023-01-12 NOTE — Telephone Encounter (Signed)
Prescription Request  01/12/2023  LOV: Last TSH 10/25/21  What is the name of the medication or equipment? Synthroid   Have you contacted your pharmacy to request a refill? No   Which pharmacy would you like this sent to?   [facility] [id number] - [place], [place] - [address] AT [facility] [address] [place] [address] Phone: [phone] Fax: [phone]    Patient notified that their request is being sent to the clinical staff for review and that they should receive a response within 2 business days.   Please advise at Mobile [phone] (mobile)

## 2023-01-13 ENCOUNTER — Other Ambulatory Visit: Payer: Self-pay | Admitting: Family Medicine

## 2023-01-13 DIAGNOSIS — E038 Other specified hypothyroidism: Secondary | ICD-10-CM

## 2023-01-14 ENCOUNTER — Other Ambulatory Visit: Payer: Medicare Other

## 2023-01-16 ENCOUNTER — Ambulatory Visit: Payer: Medicare Other | Admitting: Physician Assistant

## 2023-02-05 ENCOUNTER — Encounter: Payer: Self-pay | Admitting: Cardiovascular Disease

## 2023-02-05 ENCOUNTER — Ambulatory Visit: Payer: Medicare Other | Attending: Cardiovascular Disease | Admitting: Cardiovascular Disease

## 2023-02-05 VITALS — BP 108/60 | HR 61 | Ht 61.0 in | Wt 190.2 lb

## 2023-02-05 DIAGNOSIS — I491 Atrial premature depolarization: Secondary | ICD-10-CM

## 2023-02-05 DIAGNOSIS — E785 Hyperlipidemia, unspecified: Secondary | ICD-10-CM

## 2023-02-05 DIAGNOSIS — I10 Essential (primary) hypertension: Secondary | ICD-10-CM | POA: Diagnosis present

## 2023-02-05 DIAGNOSIS — R002 Palpitations: Secondary | ICD-10-CM

## 2023-02-05 DIAGNOSIS — R011 Cardiac murmur, unspecified: Secondary | ICD-10-CM | POA: Diagnosis present

## 2023-02-05 NOTE — Progress Notes (Signed)
Cardiology Office Note   Date:  02/05/2023   ID:  Lori Phelps, Lori Phelps 1955/12/06, MRN [id number]  PCP:  Janie Morning, NP (Inactive)  Cardiologist:   Lorine Bears, MD   Chief Complaint  Patient presents with   Follow-up    6 month f/u no complaints today. Meds reviewed verbally with pt.       History of Present Illness: Lori Phelps is a 67 y.o. female who presents for regarding palpitations due to atrial tachycardia and brief nonsustained atrial fibrillation.  She has type 2 diabetes, essential hypertension and hypothyroidism. She was seen in the past by Dr. Johney Frame for arrhythmia management.    Symptoms have been controlled with flecainide, diltiazem and metoprolol.  It was felt that the majority of her rhythm issues were related to atrial tachycardia and not atrial fibrillation and thus anticoagulation has not been recommended. Most recent echocardiogram in April 2021 showed normal LV systolic function, indeterminate diastolic function, normal pulmonary pressure and no significant valvular abnormalities.  She had atypical chest pain in the past and had a Lexiscan Myoview in September 2021 which showed no evidence of ischemia with normal ejection fraction.    During last visit, I switched her from simvastatin to atorvastatin.  She has been doing well with no recent chest pain.  She has occasional palpitations that requires an additional dose of flecainide. .  Past Medical History:  Diagnosis Date   Atrial tachycardia    Chronic atrial fibrillation (HCC)    Diabetes mellitus without complication (HCC)    Essential hypertension    GERD (gastroesophageal reflux disease)    HLD (hyperlipidemia)    HTN (hypertension)    Hx of bronchitis    uses MDI prn and on advair daily but no dx of asthma    Hypothyroidism    Mixed hyperlipidemia    Overweight    Palpitations    hx PAC"S   Phlebitis and thrombophlebitis of deep vessels of lower extremities, left (HCC)    Type 2  diabetes mellitus with other specified complication [facility])     Past Surgical History:  Procedure Laterality Date   BACK SURGERY  09/08/1992   COLONOSCOPY  09/08/2000   DILATATION & CURETTAGE/HYSTEROSCOPY WITH MYOSURE N/A 08/22/2015   Procedure: DILATATION & CURETTAGE/HYSTEROSCOPY WITH MYOSURE AND POLYPECTOMY;  Surgeon: Huel Cote, MD;  Location: [facility];  Service: Gynecology;  Laterality: N/A;   NASAL SINUS SURGERY     [id number]   REPLACEMENT TOTAL KNEE Left    03/2022     Current Outpatient Medications  Medication Sig Dispense Refill   Accu-Chek Softclix Lancets lancets Use as instructed 100 each 12   albuterol (PROVENTIL) (2.5 MG/3ML) 0.083% nebulizer solution Take 3 mLs (2.5 mg total) by nebulization every 6 (six) hours as needed for wheezing or shortness of breath. 150 mL 1   albuterol (VENTOLIN HFA) 108 (90 Base) MCG/ACT inhaler Inhale 2 puffs into the lungs every 6 (six) hours as needed for wheezing or shortness of breath. 18 g 3   aspirin EC 81 MG tablet Take 81 mg by mouth daily.     atorvastatin (LIPITOR) 40 MG tablet Take 1 tablet (40 mg total) by mouth daily. 90 tablet 3   diltiazem (CARDIZEM CD) 240 MG 24 hr capsule Take 1 capsule (240 mg total) by mouth daily. 90 capsule 1   flecainide (TAMBOCOR) 50 MG tablet Take 1 tablet (50 mg total) by mouth 2 (two) times daily. May take an extra tablet  for palpitations 180 tablet 1   fluticasone (FLONASE) 50 MCG/ACT nasal spray Place 2 sprays into both nostrils daily. 16 g 6   fluticasone-salmeterol (WIXELA INHUB) 250-50 MCG/ACT AEPB Inhale 1 puff into the lungs in the morning and at bedtime. 180 each 2   furosemide (LASIX) 20 MG tablet Take 1 tablet (20 mg total) by mouth every other day. 45 tablet 1   glucose blood (ACCU-CHEK AVIVA PLUS) test strip Use as instructed 100 each 12   glucose blood test strip Use as instructed 100 each 12   hydrALAZINE (APRESOLINE) 50 MG tablet Take 1 tablet (50 mg total) by mouth 3 (three) times  daily. 270 tablet 1   levothyroxine (SYNTHROID) 25 MCG tablet Take 1 tablet (25 mcg total) by mouth daily. 30 tablet 0   lisinopril (ZESTRIL) 20 MG tablet Take 10 mg by mouth 2 (two) times daily.     metFORMIN (GLUCOPHAGE) 500 MG tablet TAKE 1 TABLET(500 MG) BY MOUTH TWICE DAILY WITH A MEAL 180 tablet 1   metoprolol tartrate (LOPRESSOR) 25 MG tablet Take 0.5 tablets (12.5 mg total) by mouth 2 (two) times daily. 90 tablet 3   Multiple Vitamin (MULTIVITAMIN) capsule Take 1 capsule by mouth daily.     Omega-3 Fatty Acids (FISH OIL) 435 MG CAPS Take 2 capsules (870 mg total) by mouth daily. 180 capsule 2   omeprazole (PRILOSEC) 20 MG capsule Take 1 capsule (20 mg total) by mouth daily. 90 capsule 1   promethazine-dextromethorphan (PROMETHAZINE-DM) 6.25-15 MG/5ML syrup Take 5 mLs by mouth 4 (four) times daily as needed. 118 mL 0   No current facility-administered medications for this visit.    Allergies:   Penicillins, Sulfur dioxide, and Sulfonamide derivatives    Social History:  The patient  reports that she has quit smoking. She has never used smokeless tobacco. She reports that she does not drink alcohol and does not use drugs.   Family History:  The patient's family history includes Heart failure in her father; Kidney disease in her mother and another family member.    ROS:  Please see the history of present illness.   Otherwise, review of systems are positive for none.   All other systems are reviewed and negative.    PHYSICAL EXAM: VS:  BP 108/60 (BP Location: Left Arm, Patient Position: Sitting, Cuff Size: Normal)   Pulse 61   Ht 5\' 1"  (1.549 m)   Wt 190 lb 4 oz (86.3 kg)   LMP 05/21/2015 Comment: irregular since age 41   SpO2 98%   BMI 35.95 kg/m  , BMI Body mass index is 35.95 kg/m. GEN: Well nourished, well developed, in no acute distress  HEENT: normal  Neck: no JVD, carotid bruits, or masses Cardiac: RRR; no  rubs, or gallops,no edema .  2/ 6 systolic murmur in the  aortic area which is early peaking Respiratory:  clear to auscultation bilaterally, normal work of breathing GI: soft, nontender, nondistended, + BS MS: no deformity or atrophy  Skin: warm and dry, no rash Neuro:  Strength and sensation are intact Psych: euthymic mood, full affect   EKG:  EKG is ordered today. EKG f was reviewed and showed sinus rhythm with PACs.   Recent Labs: No results found for requested labs within last 365 days.    Lipid Panel    Component Value Date/Time   CHOL 187 10/25/2021 1017   TRIG 163 (H) 10/25/2021 1017   HDL 50 10/25/2021 1017   CHOLHDL 3.7 10/25/2021  1017   LDLCALC 108 (H) 10/25/2021 1017      Wt Readings from Last 3 Encounters:  02/05/23 190 lb 4 oz (86.3 kg)  08/07/22 202 lb (91.6 kg)  02/12/22 214 lb (97.1 kg)       ASSESSMENT AND PLAN:  1.  Palpitations due to PACs, atrial tachycardia and nonsustained atrial fibrillation:  Symptoms are well controlled on current medications including flecainide, metoprolol and diltiazem.    No plans for anticoagulation at the present time as the majority of her rhythm issues were felt to be due to atrial tachycardia and not atrial fibrillation.  2. Essential hypertension: Blood pressure is well controlled on current medication.    3. Diabetes mellitus:  managed by primary care physician.  4.  Hyperlipidemia: She was switched from simvastatin to atorvastatin to get her LDL below 70 given that she is diabetic.  She is going for a physical next month with labs.  5.  Cardiac murmur: She has a murmur suggestive of aortic mild aortic stenosis.  I requested an echocardiogram.   Disposition:   Follow-up in 6 months.  Signed,  Lorine Bears, MD  02/05/2023 8:38 AM    [facility]

## 2023-02-05 NOTE — Patient Instructions (Signed)
Medication Instructions:  No changes *If you need a refill on your cardiac medications before your next appointment, please call your pharmacy*   Lab Work: None ordered If you have labs (blood work) drawn today and your tests are completely normal, you will receive your results only by: MyChart Message (if you have MyChart) OR A paper copy in the mail If you have any lab test that is abnormal or we need to change your treatment, we will call you to review the results.   Testing/Procedures: Your physician has requested that you have an echocardiogram. Echocardiography is a painless test that uses sound waves to create images of your heart. It provides your doctor with information about the size and shape of your heart and how well your heart's chambers and valves are working.   You may receive an ultrasound enhancing agent through an IV if needed to better visualize your heart during the echo. This procedure takes approximately one hour.  There are no restrictions for this procedure.  This will take place at [address] ([facility] [address], [place] [address]    Follow-Up: At [facility], you and your health needs are our priority.  As part of our continuing mission to provide you with exceptional heart care, we have created designated Provider Care Teams.  These Care Teams include your primary Cardiologist (physician) and Advanced Practice Providers (APPs -  Physician Assistants and Nurse Practitioners) who all work together to provide you with the care you need, when you need it.  We recommend signing up for the patient portal called "MyChart".  Sign up information is provided on this After Visit Summary.  MyChart is used to connect with patients for Virtual Visits (Telemedicine).  Patients are able to view lab/test results, encounter notes, upcoming appointments, etc.  Non-urgent messages can be sent to your provider as well.   To learn more about what you can  do with MyChart, go to [email or web address].    Your next appointment:   6 month(s)  Provider:   You may see Muhammad Arida, MD or one of the following Advanced Practice Providers on your designated Care Team:   Christopher Berge, NP Ryan Dunn, PA-C Cadence Furth, PA-C Sheri Hammock, NP    

## 2023-02-20 ENCOUNTER — Other Ambulatory Visit: Payer: Medicare Other

## 2023-02-20 DIAGNOSIS — E782 Mixed hyperlipidemia: Secondary | ICD-10-CM

## 2023-02-20 DIAGNOSIS — E1165 Type 2 diabetes mellitus with hyperglycemia: Secondary | ICD-10-CM

## 2023-02-20 DIAGNOSIS — R011 Cardiac murmur, unspecified: Secondary | ICD-10-CM

## 2023-02-20 DIAGNOSIS — I10 Essential (primary) hypertension: Secondary | ICD-10-CM

## 2023-02-21 LAB — LIPID PANEL
Chol/HDL Ratio: 5.5 ratio — ABNORMAL HIGH (ref 0.0–4.4)
Cholesterol, Total: 247 mg/dL — ABNORMAL HIGH (ref 100–199)
HDL: 45 mg/dL (ref >39)
LDL Chol Calc (NIH): 161 mg/dL — ABNORMAL HIGH (ref 0–99)
Triglycerides: 219 mg/dL — ABNORMAL HIGH (ref 0–149)
VLDL Cholesterol Cal: 41 mg/dL — ABNORMAL HIGH (ref 5–40)

## 2023-02-21 LAB — CBC WITH DIFFERENTIAL/PLATELET
Basophils Absolute: 0.1 10*3/uL (ref 0.0–0.2)
Basos: 1 %
EOS (ABSOLUTE): 0.2 10*3/uL (ref 0.0–0.4)
Eos: 3 %
Hematocrit: 39.5 % (ref 34.0–46.6)
Hemoglobin: 13.2 g/dL (ref 11.1–15.9)
Immature Grans (Abs): 0 10*3/uL (ref 0.0–0.1)
Immature Granulocytes: 0 %
Lymphocytes Absolute: 2.7 10*3/uL (ref 0.7–3.1)
Lymphs: 40 %
MCH: 29.3 pg (ref 26.6–33.0)
MCHC: 33.4 g/dL (ref 31.5–35.7)
MCV: 88 fL (ref 79–97)
Monocytes Absolute: 0.7 10*3/uL (ref 0.1–0.9)
Monocytes: 11 %
Neutrophils Absolute: 3.1 10*3/uL (ref 1.4–7.0)
Neutrophils: 45 %
Platelets: 241 10*3/uL (ref 150–450)
RBC: 4.5 x10E6/uL (ref 3.77–5.28)
RDW: 12.7 % (ref 11.7–15.4)
WBC: 6.8 10*3/uL (ref 3.4–10.8)

## 2023-02-21 LAB — COMPREHENSIVE METABOLIC PANEL
ALT: 12 IU/L (ref 0–32)
AST: 17 IU/L (ref 0–40)
Albumin/Globulin Ratio: 1.5
Albumin: 4.1 g/dL (ref 3.9–4.9)
Alkaline Phosphatase: 89 IU/L (ref 44–121)
BUN/Creatinine Ratio: 17 (ref 12–28)
BUN: 19 mg/dL (ref 8–27)
Bilirubin Total: 0.4 mg/dL (ref 0.0–1.2)
CO2: 22 mmol/L (ref 20–29)
Calcium: 9.9 mg/dL (ref 8.7–10.3)
Chloride: 105 mmol/L (ref 96–106)
Creatinine, Ser: 1.09 mg/dL — ABNORMAL HIGH (ref 0.57–1.00)
Globulin, Total: 2.7 g/dL (ref 1.5–4.5)
Glucose: 82 mg/dL (ref 70–99)
Potassium: 5 mmol/L (ref 3.5–5.2)
Sodium: 140 mmol/L (ref 134–144)
Total Protein: 6.8 g/dL (ref 6.0–8.5)
eGFR: 56 mL/min/{1.73_m2} — ABNORMAL LOW (ref >59)

## 2023-02-21 LAB — LITHOLINK CKD PROGRAM

## 2023-02-21 LAB — HEMOGLOBIN A1C
Est. average glucose Bld gHb Est-mCnc: 114 mg/dL
Hgb A1c MFr Bld: 5.6 % (ref 4.8–5.6)

## 2023-02-24 ENCOUNTER — Ambulatory Visit (INDEPENDENT_AMBULATORY_CARE_PROVIDER_SITE_OTHER): Payer: Medicare Other | Admitting: Physician Assistant

## 2023-02-24 ENCOUNTER — Encounter: Payer: Self-pay | Admitting: Physician Assistant

## 2023-02-24 VITALS — BP 104/60 | HR 72 | Temp 97.3°F | Ht 61.0 in | Wt 189.0 lb

## 2023-02-24 DIAGNOSIS — Z23 Encounter for immunization: Secondary | ICD-10-CM | POA: Diagnosis not present

## 2023-02-24 DIAGNOSIS — E1165 Type 2 diabetes mellitus with hyperglycemia: Secondary | ICD-10-CM | POA: Diagnosis not present

## 2023-02-24 DIAGNOSIS — I10 Essential (primary) hypertension: Secondary | ICD-10-CM | POA: Diagnosis not present

## 2023-02-24 DIAGNOSIS — I4719 Other supraventricular tachycardia: Secondary | ICD-10-CM

## 2023-02-24 DIAGNOSIS — H65192 Other acute nonsuppurative otitis media, left ear: Secondary | ICD-10-CM

## 2023-02-24 DIAGNOSIS — E782 Mixed hyperlipidemia: Secondary | ICD-10-CM | POA: Diagnosis not present

## 2023-02-24 DIAGNOSIS — K219 Gastro-esophageal reflux disease without esophagitis: Secondary | ICD-10-CM

## 2023-02-24 DIAGNOSIS — Z7984 Long term (current) use of oral hypoglycemic drugs: Secondary | ICD-10-CM

## 2023-02-24 DIAGNOSIS — E1169 Type 2 diabetes mellitus with other specified complication: Secondary | ICD-10-CM

## 2023-02-24 DIAGNOSIS — N958 Other specified menopausal and perimenopausal disorders: Secondary | ICD-10-CM

## 2023-02-24 DIAGNOSIS — E038 Other specified hypothyroidism: Secondary | ICD-10-CM

## 2023-02-24 DIAGNOSIS — J4489 Other specified chronic obstructive pulmonary disease: Secondary | ICD-10-CM | POA: Insufficient documentation

## 2023-02-24 DIAGNOSIS — K2100 Gastro-esophageal reflux disease with esophagitis, without bleeding: Secondary | ICD-10-CM

## 2023-02-24 LAB — POCT URINALYSIS DIP (CLINITEK)
Bilirubin, UA: NEGATIVE
Blood, UA: NEGATIVE
Glucose, UA: NEGATIVE mg/dL
Ketones, POC UA: NEGATIVE mg/dL
Leukocytes, UA: NEGATIVE
Nitrite, UA: NEGATIVE
POC PROTEIN,UA: NEGATIVE
Spec Grav, UA: 1.015 (ref 1.010–1.025)
Urobilinogen, UA: 0.2 E.U./dL
pH, UA: 6 (ref 5.0–8.0)

## 2023-02-24 MED ORDER — LANCETS MISC. MISC: 1.0000 | Freq: Three times a day (TID) | 0 refills | Status: AC

## 2023-02-24 MED ORDER — FLUTICASONE-SALMETEROL 250-50 MCG/ACT IN AEPB: 1.0000 | INHALATION_SPRAY | Freq: Two times a day (BID) | RESPIRATORY_TRACT | 1 refills | Status: DC

## 2023-02-24 MED ORDER — BLOOD GLUCOSE MONITORING SUPPL DEVI: 1.0000 | Freq: Three times a day (TID) | 0 refills | Status: AC

## 2023-02-24 MED ORDER — BLOOD GLUCOSE TEST VI STRP: 1.0000 | ORAL_STRIP | Freq: Three times a day (TID) | 0 refills | Status: AC

## 2023-02-24 MED ORDER — OMEPRAZOLE 20 MG PO CPDR
20.0000 mg | DELAYED_RELEASE_CAPSULE | Freq: Every day | ORAL | 1 refills | Status: DC
Start: 2023-02-24 — End: 2023-07-15

## 2023-02-24 MED ORDER — ALBUTEROL SULFATE HFA 108 (90 BASE) MCG/ACT IN AERS
2.0000 | INHALATION_SPRAY | Freq: Four times a day (QID) | RESPIRATORY_TRACT | 3 refills | Status: DC | PRN
Start: 2023-02-24 — End: 2024-01-01

## 2023-02-24 MED ORDER — LEVOTHYROXINE SODIUM 25 MCG PO TABS: 25.0000 ug | ORAL_TABLET | Freq: Every day | ORAL | 1 refills | Status: DC

## 2023-02-24 MED ORDER — METFORMIN HCL 500 MG PO TABS: ORAL_TABLET | ORAL | 1 refills | Status: DC

## 2023-02-24 MED ORDER — LANCET DEVICE MISC: 1.0000 | Freq: Three times a day (TID) | 0 refills | Status: AC

## 2023-02-24 MED ORDER — HYDRALAZINE HCL 50 MG PO TABS
50.0000 mg | ORAL_TABLET | Freq: Three times a day (TID) | ORAL | 1 refills | Status: DC
Start: 2023-02-24 — End: 2023-11-27

## 2023-02-24 MED ORDER — FLUTICASONE PROPIONATE 50 MCG/ACT NA SUSP
2.0000 | Freq: Every day | NASAL | 5 refills | Status: DC
Start: 2023-02-24 — End: 2024-01-01

## 2023-02-24 NOTE — Progress Notes (Signed)
Subjective:  Patient ID: Lori Phelps, female    DOB: Dec 09, 1955  Age: 67 y.o. MRN: [id number]  Chief Complaint  Patient presents with   Medical Management of Chronic Issues   PT NEW TO ME _ - [facility] - NOT SEEN HERE IN OVER A YEAR HPI  Pt with history of hypertension and atrial tachycardia with nonsustained afib - she is currently following with Dr Kirke Corin regularly - last appt was on 5/30 and she is scheduled for echocardiogram in a few weeks.  She sees him every 6 months  Current meds for these issues include cardizem CD 240, Tamocor 50, metoprolol 25, lisinopril 20, lasix 20 and hydralazine 50mg  She denies chest pain/dyspnea today  Pt with history of NIDDM - she is currently taking metformin 500mg  bid - she came in for labwork before appt and hgb A1c is noted at 5.6 She is due for eye exam Voices no problems   Pt with history of hypothyroidism - TSH was not ordered when she came in for labwork so will add that on - currently on synthroid every day Voices no problems  Pt with history of hyperlipidemia - cardiology switched her to lipitor 40 in November.  She states she was scared to start medication and actually has not been taking it until this past Sunday.  Labwork shows chol at 247, trig at 219 and LDL at 161 Recommend she take med as directed!!!  Pt with history of COPD - currently on ventolin and Advair - requests refill of meds No current trouble breathing  Pt would like to update pneumo vaccine  Pt would like to schedule dexa scan     02/24/2023    1:24 PM 05/17/2021    8:08 AM  Depression screen PHQ 2/9  Decreased Interest 0 0  Down, Depressed, Hopeless 0 0  PHQ - 2 Score 0 0  Altered sleeping 0   Tired, decreased energy 0   Change in appetite 0   Feeling bad or failure about yourself  0   Trouble concentrating 0   Moving slowly or fidgety/restless 0   Suicidal thoughts 0   PHQ-9 Score 0   Difficult doing work/chores Not difficult at all          05/17/2021    8:08 AM 02/24/2023    1:24 PM  Fall Risk  Falls in the past year? 0 0  Was there an injury with Fall? 0 0  Fall Risk Category Calculator 0 0  Fall Risk Category (Retired) Low   (RETIRED) Patient Fall Risk Level Low fall risk   Patient at Risk for Falls Due to  No Fall Risks  Fall risk Follow up  Falls evaluation completed     ROS CONSTITUTIONAL: Negative for chills, fatigue, fever, unintentional weight gain and unintentional weight loss.  E/N/T: Negative for ear pain, nasal congestion and sore throat.  CARDIOVASCULAR: Negative for chest pain, dizziness, palpitations and pedal edema.  RESPIRATORY: Negative for recent cough and dyspnea.  GASTROINTESTINAL: Negative for abdominal pain, acid reflux symptoms, constipation, diarrhea, nausea and vomiting.  MSK: Negative for arthralgias and myalgias.  INTEGUMENTARY: Negative for rash.  NEUROLOGICAL: Negative for dizziness and headaches.  PSYCHIATRIC: Negative for sleep disturbance and to question depression screen.  Negative for depression, negative for anhedonia.    Current Outpatient Medications:    albuterol (PROVENTIL) (2.5 MG/3ML) 0.083% nebulizer solution, Take 3 mLs (2.5 mg total) by nebulization every 6 (six) hours as needed for wheezing or shortness  of breath., Disp: 150 mL, Rfl: 1   aspirin EC 81 MG tablet, Take 81 mg by mouth daily., Disp: , Rfl:    atorvastatin (LIPITOR) 40 MG tablet, Take 1 tablet (40 mg total) by mouth daily., Disp: 90 tablet, Rfl: 3   Blood Glucose Monitoring Suppl DEVI, 1 each by Does not apply route in the morning, at noon, and at bedtime. May substitute to any manufacturer covered by patient's insurance., Disp: 1 each, Rfl: 0   diltiazem (CARDIZEM CD) 240 MG 24 hr capsule, Take 1 capsule (240 mg total) by mouth daily., Disp: 90 capsule, Rfl: 1   flecainide (TAMBOCOR) 50 MG tablet, Take 1 tablet (50 mg total) by mouth 2 (two) times daily. May take an extra tablet for palpitations, Disp: 180 tablet,  Rfl: 1   furosemide (LASIX) 20 MG tablet, Take 1 tablet (20 mg total) by mouth every other day., Disp: 45 tablet, Rfl: 1   Glucose Blood (BLOOD GLUCOSE TEST STRIPS) STRP, 1 each by In Vitro route in the morning, at noon, and at bedtime. May substitute to any manufacturer covered by patient's insurance., Disp: 100 strip, Rfl: 0   Lancet Device MISC, 1 each by Does not apply route in the morning, at noon, and at bedtime. May substitute to any manufacturer covered by patient's insurance., Disp: 1 each, Rfl: 0   Lancets Misc. MISC, 1 each by Does not apply route in the morning, at noon, and at bedtime. May substitute to any manufacturer covered by patient's insurance., Disp: 100 each, Rfl: 0   lisinopril (ZESTRIL) 20 MG tablet, Take 10 mg by mouth 2 (two) times daily., Disp: , Rfl:    metoprolol tartrate (LOPRESSOR) 25 MG tablet, Take 0.5 tablets (12.5 mg total) by mouth 2 (two) times daily., Disp: 90 tablet, Rfl: 3   Multiple Vitamin (MULTIVITAMIN) capsule, Take 1 capsule by mouth daily., Disp: , Rfl:    Omega-3 Fatty Acids (FISH OIL) 435 MG CAPS, Take 2 capsules (870 mg total) by mouth daily., Disp: 180 capsule, Rfl: 2   albuterol (VENTOLIN HFA) 108 (90 Base) MCG/ACT inhaler, Inhale 2 puffs into the lungs every 6 (six) hours as needed for wheezing or shortness of breath., Disp: 18 g, Rfl: 3   fluticasone (FLONASE) 50 MCG/ACT nasal spray, Place 2 sprays into both nostrils daily., Disp: 16 g, Rfl: 5   fluticasone-salmeterol (WIXELA INHUB) 250-50 MCG/ACT AEPB, Inhale 1 puff into the lungs in the morning and at bedtime., Disp: 3 each, Rfl: 1   hydrALAZINE (APRESOLINE) 50 MG tablet, Take 1 tablet (50 mg total) by mouth 3 (three) times daily., Disp: 270 tablet, Rfl: 1   levothyroxine (SYNTHROID) 25 MCG tablet, Take 1 tablet (25 mcg total) by mouth daily., Disp: 90 tablet, Rfl: 1   metFORMIN (GLUCOPHAGE) 500 MG tablet, TAKE 1 TABLET(500 MG) BY MOUTH TWICE DAILY WITH A MEAL, Disp: 180 tablet, Rfl: 1    omeprazole (PRILOSEC) 20 MG capsule, Take 1 capsule (20 mg total) by mouth daily., Disp: 90 capsule, Rfl: 1  Past Medical History:  Diagnosis Date   Atrial tachycardia    Chronic atrial fibrillation (HCC)    Diabetes mellitus without complication (HCC)    Essential hypertension    GERD (gastroesophageal reflux disease)    HLD (hyperlipidemia)    HTN (hypertension)    Hx of bronchitis    uses MDI prn and on advair daily but no dx of asthma    Hypothyroidism    Mixed hyperlipidemia    Overweight  Palpitations    hx PAC"S   Phlebitis and thrombophlebitis of deep vessels of lower extremities, left (HCC)    Type 2 diabetes mellitus with other specified complication (HCC)    Objective:  PHYSICAL EXAM:   BP 104/60 (BP Location: Left Arm, Patient Position: Sitting, Cuff Size: Large)   Pulse 72   Temp (!) 97.3 F (36.3 C) (Temporal)   Ht 5\' 1"  (1.549 m)   Wt 189 lb (85.7 kg)   LMP 05/21/2015 Comment: irregular since age 74   SpO2 96%   BMI 35.71 kg/m    GEN: Well nourished, well developed, in no acute distress  Cardiac: RRR; faint systolic murmur noted, rubs, or gallops,no edema -  Respiratory:  normal respiratory rate and pattern with no distress - normal breath sounds with no rales, rhonchi, wheezes or rubs MS: no deformity or atrophy  Skin: warm and dry, no rash  Neuro:  Alert and Oriented x 3, - CN II-Xii grossly intact Psych: euthymic mood, appropriate affect and demeanor  Office Visit on 02/24/2023  Component Date Value Ref Range Status   Color, UA 02/24/2023 yellow  yellow Final   Clarity, UA 02/24/2023 clear  clear Final   Glucose, UA 02/24/2023 negative  negative mg/dL Final   Bilirubin, UA 09/60/4540 negative  negative Final   Ketones, POC UA 02/24/2023 negative  negative mg/dL Final   Spec Grav, UA 98/07/9146 1.015  1.010 - 1.025 Final   Blood, UA 02/24/2023 negative  negative Final   pH, UA 02/24/2023 6.0  5.0 - 8.0 Final   POC PROTEIN,UA 02/24/2023 negative   negative, trace Final   Urobilinogen, UA 02/24/2023 0.2  0.2 or 1.0 E.U./dL Final   Nitrite, UA 82/95/6213 Negative  Negative Final   Leukocytes, UA 02/24/2023 Negative  Negative Final    Assessment & Plan:    Type 2 diabetes mellitus with hyperglycemia, without long-term current use of insulin (HCC) -     POCT URINALYSIS DIP (CLINITEK) -     Microalbumin / creatinine urine ratio Continue med Mixed hyperlipidemia START LIPITOR AS DIRECTED!!! WATCH DIET Essential hypertension Continue current meds Atrial tachycardia Continue meds  Follow up with cardiology as directed Other specified hypothyroidism -     TSH Continue synthroid Gastroesophageal reflux disease without esophagitis Continue prilosec Obstructive chronic bronchitis without exacerbation Continue meds Need for vaccination for pneumococcus -     Pneumococcal conjugate vaccine 20-valent  Other specified menopausal and perimenopausal disorders -     DG DXA FRACTURE ASSESSMENT; Future  Other orders -     Blood Glucose Monitoring Suppl; 1 each by Does not apply route in the morning, at noon, and at bedtime. May substitute to any manufacturer covered by patient's insurance.  Dispense: 1 each; Refill: 0 -     Blood Glucose Test; 1 each by In Vitro route in the morning, at noon, and at bedtime. May substitute to any manufacturer covered by patient's insurance.  Dispense: 100 strip; Refill: 0 -     Lancet Device; 1 each by Does not apply route in the morning, at noon, and at bedtime. May substitute to any manufacturer covered by patient's insurance.  Dispense: 1 each; Refill: 0 -     Lancets Misc.; 1 each by Does not apply route in the morning, at noon, and at bedtime. May substitute to any manufacturer covered by patient's insurance.  Dispense: 100 each; Refill: 0     Follow-up: Return in about 6 months (around 08/26/2023) for chronic fasting follow-up -  3 months fasting nurse visit for cmp/lipid.  An After Visit Summary  was printed and given to the patient.  Jettie Pagan [facility] [phone]

## 2023-02-25 LAB — MICROALBUMIN / CREATININE URINE RATIO
Creatinine, Urine: 66.6 mg/dL
Microalb/Creat Ratio: <5 mg/g creat (ref 0–29)
Microalbumin, Urine: <3 ug/mL

## 2023-03-02 ENCOUNTER — Telehealth: Payer: Self-pay | Admitting: Physician Assistant

## 2023-03-02 NOTE — Telephone Encounter (Signed)
   Lori Phelps has been scheduled for the following appointment:  WHAT: BONE DENSITY WHERE: [facility] DATE: 03/19/23 TIME: 9:00 AM CHECK-IN  Patient has been made aware.

## 2023-03-04 LAB — TSH: TSH: 2.76 u[IU]/mL (ref 0.450–4.500)

## 2023-03-04 LAB — SPECIMEN STATUS REPORT

## 2023-03-09 ENCOUNTER — Telehealth: Payer: Self-pay

## 2023-03-09 NOTE — Telephone Encounter (Signed)
Contacted Lori Phelps to schedule their annual wellness visit. Patient declined to schedule AWV at this time.

## 2023-03-27 ENCOUNTER — Ambulatory Visit: Payer: Medicare Other | Attending: Cardiovascular Disease

## 2023-03-27 DIAGNOSIS — R011 Cardiac murmur, unspecified: Secondary | ICD-10-CM | POA: Insufficient documentation

## 2023-03-27 LAB — ECHOCARDIOGRAM COMPLETE
AR max vel: 2.34 cm2
AV Area VTI: 2.47 cm2
AV Area mean vel: 2.37 cm2
AV Mean grad: 9 mmHg
AV Peak grad: 17.1 mmHg
Ao pk vel: 2.07 m/s
Area-P 1/2: 3.42 cm2
Calc EF: 58.8 %
S' Lateral: 3.1 cm
Single Plane A2C EF: 59.6 %
Single Plane A4C EF: 59.4 %

## 2023-04-01 LAB — HM DEXA SCAN

## 2023-04-02 ENCOUNTER — Encounter: Payer: Self-pay | Admitting: Physician Assistant

## 2023-04-09 LAB — HM MAMMOGRAPHY

## 2023-04-21 ENCOUNTER — Telehealth: Payer: Self-pay

## 2023-04-21 NOTE — Patient Outreach (Signed)
  Care Coordination   Initial Visit Note   04/21/2023 Name: Lori Phelps MRN: [id number] DOB: 30-Jun-1956  Lori Phelps is a 67 y.o. year old female who sees Marianne Sofia, [place] for primary care. I spoke with  [name] by phone today.  What matters to the patients health and wellness today?  Placed call to patient to review and offer [facility] care coordination program. Patient reports that she is doing well and denies any needs at this time.    SDOH assessments and interventions completed:  No     Care Coordination Interventions:  No, not indicated   Follow up plan: No further intervention required.   Encounter Outcome:  Pt. Refused   Rowe Pavy, RN, BSN, CEN [facility] [employer] [phone]

## 2023-05-20 ENCOUNTER — Encounter: Payer: Self-pay | Admitting: Cardiovascular Disease

## 2023-05-20 ENCOUNTER — Other Ambulatory Visit: Payer: Self-pay | Admitting: Physician Assistant

## 2023-05-20 DIAGNOSIS — I4719 Other supraventricular tachycardia: Secondary | ICD-10-CM

## 2023-05-20 DIAGNOSIS — I10 Essential (primary) hypertension: Secondary | ICD-10-CM

## 2023-05-20 MED ORDER — FLECAINIDE ACETATE 50 MG PO TABS
50.0000 mg | ORAL_TABLET | Freq: Two times a day (BID) | ORAL | 3 refills | Status: DC
Start: 2023-05-20 — End: 2024-04-19

## 2023-05-20 MED ORDER — LISINOPRIL 10 MG PO TABS: 10.0000 mg | ORAL_TABLET | Freq: Two times a day (BID) | ORAL | 3 refills | Status: DC

## 2023-05-20 MED ORDER — DILTIAZEM HCL ER COATED BEADS 240 MG PO CP24: 240.0000 mg | ORAL_CAPSULE | Freq: Every day | ORAL | 3 refills | Status: DC

## 2023-05-20 MED ORDER — METOPROLOL TARTRATE 25 MG PO TABS: 12.5000 mg | ORAL_TABLET | Freq: Two times a day (BID) | ORAL | 3 refills | Status: DC

## 2023-05-20 MED ORDER — FUROSEMIDE 20 MG PO TABS: 20.0000 mg | ORAL_TABLET | ORAL | 3 refills | Status: DC

## 2023-05-26 ENCOUNTER — Other Ambulatory Visit: Payer: Self-pay

## 2023-05-26 DIAGNOSIS — E782 Mixed hyperlipidemia: Secondary | ICD-10-CM

## 2023-05-26 DIAGNOSIS — I10 Essential (primary) hypertension: Secondary | ICD-10-CM

## 2023-05-29 ENCOUNTER — Other Ambulatory Visit: Payer: Medicare Other

## 2023-07-15 ENCOUNTER — Other Ambulatory Visit: Payer: Self-pay | Admitting: Family Medicine

## 2023-07-15 DIAGNOSIS — K2100 Gastro-esophageal reflux disease with esophagitis, without bleeding: Secondary | ICD-10-CM

## 2023-07-15 DIAGNOSIS — E1169 Type 2 diabetes mellitus with other specified complication: Secondary | ICD-10-CM

## 2023-08-20 ENCOUNTER — Encounter: Payer: Self-pay | Admitting: Cardiovascular Disease

## 2023-08-20 ENCOUNTER — Ambulatory Visit: Payer: Medicare Other | Attending: Cardiovascular Disease | Admitting: Cardiovascular Disease

## 2023-08-20 VITALS — BP 118/60 | HR 64 | Ht 60.0 in | Wt 190.5 lb

## 2023-08-20 DIAGNOSIS — I358 Other nonrheumatic aortic valve disorders: Secondary | ICD-10-CM | POA: Diagnosis present

## 2023-08-20 DIAGNOSIS — E785 Hyperlipidemia, unspecified: Secondary | ICD-10-CM

## 2023-08-20 DIAGNOSIS — R002 Palpitations: Secondary | ICD-10-CM

## 2023-08-20 DIAGNOSIS — I4719 Other supraventricular tachycardia: Secondary | ICD-10-CM

## 2023-08-20 DIAGNOSIS — I10 Essential (primary) hypertension: Secondary | ICD-10-CM | POA: Diagnosis present

## 2023-08-20 NOTE — Patient Instructions (Signed)

## 2023-08-20 NOTE — Progress Notes (Signed)
Cardiology Office Note   Date:  08/20/2023   ID:  Lori Phelps, Lori Phelps 1956/05/30, MRN [id number]  PCP:  Marianne Sofia, PA-C  Cardiologist:   Lorine Bears, MD   Chief Complaint  Patient presents with   Follow-up    6 month f/u no complaints today. Meds reviewed verbally with pt.       History of Present Illness: Lori Phelps is a 67 y.o. female who presents for regarding palpitations due to atrial tachycardia and brief nonsustained atrial fibrillation.  She has type 2 diabetes, essential hypertension and hypothyroidism. She was seen in the past by Dr. Johney Frame for arrhythmia management.    Symptoms have been controlled with flecainide, diltiazem and metoprolol.  It was felt that the majority of her rhythm issues were related to atrial tachycardia and not atrial fibrillation and thus anticoagulation has not been recommended. Most recent echocardiogram in April 2021 showed normal LV systolic function, indeterminate diastolic function, normal pulmonary pressure and no significant valvular abnormalities.  She had atypical chest pain in the past and had a Lexiscan Myoview in September 2021 which showed no evidence of ischemia with normal ejection fraction.    She had an echocardiogram done in July of this year which showed normal LV systolic function.  Aortic valve was calcified but no significant stenosis.  She has been doing very well with no chest pain, shortness of breath or palpitations. .  Past Medical History:  Diagnosis Date   Atrial tachycardia (HCC)    Chronic atrial fibrillation (HCC)    Diabetes mellitus without complication (HCC)    Essential hypertension    GERD (gastroesophageal reflux disease)    HLD (hyperlipidemia)    HTN (hypertension)    Hx of bronchitis    uses MDI prn and on advair daily but no dx of asthma    Hypothyroidism    Mixed hyperlipidemia    Overweight    Palpitations    hx PAC"S   Phlebitis and thrombophlebitis of deep vessels of lower  extremities, left (HCC)    Type 2 diabetes mellitus with other specified complication [facility])     Past Surgical History:  Procedure Laterality Date   BACK SURGERY  09/08/1992   COLONOSCOPY  09/08/2000   DILATATION & CURETTAGE/HYSTEROSCOPY WITH MYOSURE N/A 08/22/2015   Procedure: DILATATION & CURETTAGE/HYSTEROSCOPY WITH MYOSURE AND POLYPECTOMY;  Surgeon: Huel Cote, MD;  Location: [facility];  Service: Gynecology;  Laterality: N/A;   NASAL SINUS SURGERY     [id number]   REPLACEMENT TOTAL KNEE Left    03/2022     Current Outpatient Medications  Medication Sig Dispense Refill   albuterol (PROVENTIL) (2.5 MG/3ML) 0.083% nebulizer solution Take 3 mLs (2.5 mg total) by nebulization every 6 (six) hours as needed for wheezing or shortness of breath. 150 mL 1   albuterol (VENTOLIN HFA) 108 (90 Base) MCG/ACT inhaler Inhale 2 puffs into the lungs every 6 (six) hours as needed for wheezing or shortness of breath. 18 g 3   aspirin EC 81 MG tablet Take 81 mg by mouth daily.     atorvastatin (LIPITOR) 40 MG tablet Take 1 tablet (40 mg total) by mouth daily. 90 tablet 3   Blood Glucose Monitoring Suppl DEVI 1 each by Does not apply route in the morning, at noon, and at bedtime. May substitute to any manufacturer covered by patient's insurance. 1 each 0   diltiazem (CARDIZEM CD) 240 MG 24 hr capsule Take 1 capsule (240 mg total) by  mouth daily. 90 capsule 3   flecainide (TAMBOCOR) 50 MG tablet Take 1 tablet (50 mg total) by mouth 2 (two) times daily. May take an extra tablet for palpitations 180 tablet 3   fluticasone (FLONASE) 50 MCG/ACT nasal spray Place 2 sprays into both nostrils daily. 16 g 5   fluticasone-salmeterol (WIXELA INHUB) 250-50 MCG/ACT AEPB Inhale 1 puff into the lungs in the morning and at bedtime. 3 each 1   furosemide (LASIX) 20 MG tablet Take 1 tablet (20 mg total) by mouth every other day. 45 tablet 3   Glucose Blood (BLOOD GLUCOSE TEST STRIPS) STRP 1 each by In Vitro route in the  morning, at noon, and at bedtime. May substitute to any manufacturer covered by patient's insurance. 100 strip 0   hydrALAZINE (APRESOLINE) 50 MG tablet Take 1 tablet (50 mg total) by mouth 3 (three) times daily. 270 tablet 1   levothyroxine (SYNTHROID) 25 MCG tablet Take 1 tablet (25 mcg total) by mouth daily. 90 tablet 1   lisinopril (ZESTRIL) 10 MG tablet Take 1 tablet (10 mg total) by mouth in the morning and at bedtime. 180 tablet 3   metFORMIN (GLUCOPHAGE) 500 MG tablet TAKE 1 TABLET(500 MG) BY MOUTH TWICE DAILY WITH A MEAL 180 tablet 1   metoprolol tartrate (LOPRESSOR) 25 MG tablet Take 0.5 tablets (12.5 mg total) by mouth 2 (two) times daily. 90 tablet 3   Multiple Vitamin (MULTIVITAMIN) capsule Take 1 capsule by mouth daily.     Omega-3 Fatty Acids (FISH OIL) 435 MG CAPS Take 2 capsules (870 mg total) by mouth daily. 180 capsule 2   omeprazole (PRILOSEC) 20 MG capsule TAKE 1 CAPSULE(20 MG) BY MOUTH DAILY 90 capsule 1   No current facility-administered medications for this visit.    Allergies:   Penicillins, Sulfur dioxide, and Sulfonamide derivatives    Social History:  The patient  reports that she has quit smoking. She has never used smokeless tobacco. She reports that she does not drink alcohol and does not use drugs.   Family History:  The patient's family history includes Heart failure in her father; Kidney disease in her mother and another family member.    ROS:  Please see the history of present illness.   Otherwise, review of systems are positive for none.   All other systems are reviewed and negative.    PHYSICAL EXAM: VS:  BP 118/60 (BP Location: Left Arm, Patient Position: Sitting, Cuff Size: Large)   Pulse 64   Ht 5' (1.524 m)   Wt 190 lb 8 oz (86.4 kg)   LMP 05/21/2015 Comment: irregular since age 14   SpO2 96%   BMI 37.20 kg/m  , BMI Body mass index is 37.2 kg/m. GEN: Well nourished, well developed, in no acute distress  HEENT: normal  Neck: no JVD, carotid  bruits, or masses Cardiac: RRR; no  rubs, or gallops,no edema .  2/ 6 systolic murmur in the aortic area which is early peaking Respiratory:  clear to auscultation bilaterally, normal work of breathing GI: soft, nontender, nondistended, + BS MS: no deformity or atrophy  Skin: warm and dry, no rash Neuro:  Strength and sensation are intact Psych: euthymic mood, full affect   EKG:  EKG is ordered today. EKG f was reviewed and showed: Normal sinus rhythm Normal ECG When compared with ECG of 16-Aug-2015 11:21, No significant change was found    Recent Labs: 02/20/2023: ALT 12; BUN 19; Creatinine, Ser 1.09; Hemoglobin 13.2; Platelets 241;  Potassium 5.0; Sodium 140; TSH 2.760    Lipid Panel    Component Value Date/Time   CHOL 247 (H) 02/20/2023 0813   TRIG 219 (H) 02/20/2023 0813   HDL 45 02/20/2023 0813   CHOLHDL 5.5 (H) 02/20/2023 0813   LDLCALC 161 (H) 02/20/2023 0813      Wt Readings from Last 3 Encounters:  08/20/23 190 lb 8 oz (86.4 kg)  02/24/23 189 lb (85.7 kg)  02/05/23 190 lb 4 oz (86.3 kg)       ASSESSMENT AND PLAN:  1.  Palpitations due to PACs, atrial tachycardia and nonsustained atrial fibrillation:  Symptoms are well controlled on current medications including flecainide, metoprolol and diltiazem.    No plans for anticoagulation at the present time as the majority of her rhythm issues were felt to be due to atrial tachycardia and not atrial fibrillation.  2. Essential hypertension: Blood pressure is well controlled on current medication.    3. Diabetes mellitus:  managed by primary care physician.  4.  Hyperlipidemia: Simvastatin was switched to atorvastatin.  Recommended target LDL of less than 70 given that she is diabetic.  5.  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  Echocardiogram this year showed no significant stenosis.   Disposition:   Follow-up in 6 months.  Signed,  Lorine Bears, MD  08/20/2023 10:18 AM    [facility]

## 2023-09-10 ENCOUNTER — Ambulatory Visit: Payer: Medicare Other | Admitting: Physician Assistant

## 2023-11-27 ENCOUNTER — Encounter: Payer: Self-pay | Admitting: Cardiovascular Disease

## 2023-11-27 DIAGNOSIS — I10 Essential (primary) hypertension: Secondary | ICD-10-CM

## 2023-11-27 MED ORDER — METOPROLOL TARTRATE 25 MG PO TABS: 12.5000 mg | ORAL_TABLET | Freq: Two times a day (BID) | ORAL | 0 refills | Status: DC

## 2023-11-27 MED ORDER — DILTIAZEM HCL ER COATED BEADS 240 MG PO CP24: 240.0000 mg | ORAL_CAPSULE | Freq: Every day | ORAL | 0 refills | Status: DC

## 2023-11-27 MED ORDER — LISINOPRIL 10 MG PO TABS: 10.0000 mg | ORAL_TABLET | Freq: Two times a day (BID) | ORAL | 0 refills | Status: DC

## 2023-11-27 MED ORDER — ATORVASTATIN CALCIUM 40 MG PO TABS
40.0000 mg | ORAL_TABLET | Freq: Every day | ORAL | 0 refills | Status: DC
Start: 2023-11-27 — End: 2024-04-19

## 2023-11-27 MED ORDER — HYDRALAZINE HCL 50 MG PO TABS
50.0000 mg | ORAL_TABLET | Freq: Three times a day (TID) | ORAL | 0 refills | Status: DC
Start: 2023-11-27 — End: 2024-04-19

## 2023-11-27 MED ORDER — FUROSEMIDE 20 MG PO TABS: 20.0000 mg | ORAL_TABLET | ORAL | 0 refills | Status: DC

## 2023-12-14 ENCOUNTER — Other Ambulatory Visit: Payer: Self-pay

## 2023-12-14 ENCOUNTER — Other Ambulatory Visit: Payer: Self-pay | Admitting: Physician Assistant

## 2023-12-14 ENCOUNTER — Telehealth: Payer: Self-pay

## 2023-12-14 DIAGNOSIS — E038 Other specified hypothyroidism: Secondary | ICD-10-CM

## 2023-12-14 DIAGNOSIS — I10 Essential (primary) hypertension: Secondary | ICD-10-CM

## 2023-12-14 DIAGNOSIS — E782 Mixed hyperlipidemia: Secondary | ICD-10-CM

## 2023-12-14 DIAGNOSIS — E1169 Type 2 diabetes mellitus with other specified complication: Secondary | ICD-10-CM

## 2023-12-14 DIAGNOSIS — E1165 Type 2 diabetes mellitus with hyperglycemia: Secondary | ICD-10-CM

## 2023-12-14 MED ORDER — METFORMIN HCL 500 MG PO TABS: ORAL_TABLET | ORAL | 0 refills | Status: DC

## 2023-12-14 MED ORDER — LEVOTHYROXINE SODIUM 25 MCG PO TABS: 25.0000 ug | ORAL_TABLET | Freq: Every day | ORAL | 0 refills | Status: DC

## 2023-12-14 NOTE — Telephone Encounter (Signed)
 Order put in for basic labs If others warranted at time of visit will draw again

## 2023-12-14 NOTE — Telephone Encounter (Signed)
 Patient has appointment on 12/31/23 and would like to come in on 12/29/23 to get her labs, explain to patient that provider recommends patient doing labs at time of visit but patient stated she would like to have her labs done before her appointment to go over labs with provider.

## 2023-12-29 ENCOUNTER — Other Ambulatory Visit

## 2023-12-29 DIAGNOSIS — E1165 Type 2 diabetes mellitus with hyperglycemia: Secondary | ICD-10-CM

## 2023-12-29 DIAGNOSIS — I10 Essential (primary) hypertension: Secondary | ICD-10-CM

## 2023-12-29 DIAGNOSIS — E038 Other specified hypothyroidism: Secondary | ICD-10-CM

## 2023-12-29 DIAGNOSIS — E782 Mixed hyperlipidemia: Secondary | ICD-10-CM

## 2023-12-30 LAB — CBC WITH DIFFERENTIAL/PLATELET
Basophils Absolute: 0.1 10*3/uL (ref 0.0–0.2)
Basos: 1 %
EOS (ABSOLUTE): 0.3 10*3/uL (ref 0.0–0.4)
Eos: 4 %
Hematocrit: 37.8 % (ref 34.0–46.6)
Hemoglobin: 12.8 g/dL (ref 11.1–15.9)
Immature Grans (Abs): 0 10*3/uL (ref 0.0–0.1)
Immature Granulocytes: 0 %
Lymphocytes Absolute: 2.6 10*3/uL (ref 0.7–3.1)
Lymphs: 34 %
MCH: 29.8 pg (ref 26.6–33.0)
MCHC: 33.9 g/dL (ref 31.5–35.7)
MCV: 88 fL (ref 79–97)
Monocytes Absolute: 0.8 10*3/uL (ref 0.1–0.9)
Monocytes: 10 %
Neutrophils Absolute: 3.8 10*3/uL (ref 1.4–7.0)
Neutrophils: 51 %
Platelets: 211 10*3/uL (ref 150–450)
RBC: 4.29 x10E6/uL (ref 3.77–5.28)
RDW: 13.1 % (ref 11.7–15.4)
WBC: 7.6 10*3/uL (ref 3.4–10.8)

## 2023-12-30 LAB — LIPID PANEL
Chol/HDL Ratio: 3.3 ratio (ref 0.0–4.4)
Cholesterol, Total: 143 mg/dL (ref 100–199)
HDL: 44 mg/dL (ref >39)
LDL Chol Calc (NIH): 75 mg/dL (ref 0–99)
Triglycerides: 136 mg/dL (ref 0–149)
VLDL Cholesterol Cal: 24 mg/dL (ref 5–40)

## 2023-12-30 LAB — COMPREHENSIVE METABOLIC PANEL WITH GFR
ALT: 17 IU/L (ref 0–32)
AST: 22 IU/L (ref 0–40)
Albumin: 4.1 g/dL (ref 3.9–4.9)
Alkaline Phosphatase: 79 IU/L (ref 44–121)
BUN/Creatinine Ratio: 20 (ref 12–28)
BUN: 21 mg/dL (ref 8–27)
Bilirubin Total: 0.3 mg/dL (ref 0.0–1.2)
CO2: 21 mmol/L (ref 20–29)
Calcium: 9.5 mg/dL (ref 8.7–10.3)
Chloride: 102 mmol/L (ref 96–106)
Creatinine, Ser: 1.06 mg/dL — ABNORMAL HIGH (ref 0.57–1.00)
Globulin, Total: 2.2 g/dL (ref 1.5–4.5)
Glucose: 92 mg/dL (ref 70–99)
Potassium: 4.9 mmol/L (ref 3.5–5.2)
Sodium: 138 mmol/L (ref 134–144)
Total Protein: 6.3 g/dL (ref 6.0–8.5)
eGFR: 58 mL/min/{1.73_m2} — ABNORMAL LOW (ref >59)

## 2023-12-30 LAB — TSH: TSH: 2.24 u[IU]/mL (ref 0.450–4.500)

## 2023-12-30 LAB — HEMOGLOBIN A1C
Est. average glucose Bld gHb Est-mCnc: 117 mg/dL
Hgb A1c MFr Bld: 5.7 % — ABNORMAL HIGH (ref 4.8–5.6)

## 2023-12-31 ENCOUNTER — Ambulatory Visit: Admitting: Physician Assistant

## 2024-01-01 ENCOUNTER — Ambulatory Visit: Admitting: Family Medicine

## 2024-01-01 ENCOUNTER — Encounter: Payer: Self-pay | Admitting: Family Medicine

## 2024-01-01 VITALS — BP 104/60 | HR 61 | Temp 98.1°F | Ht 61.5 in | Wt 192.0 lb

## 2024-01-01 DIAGNOSIS — F5109 Other insomnia not due to a substance or known physiological condition: Secondary | ICD-10-CM

## 2024-01-01 DIAGNOSIS — J4489 Other specified chronic obstructive pulmonary disease: Secondary | ICD-10-CM

## 2024-01-01 DIAGNOSIS — H6690 Otitis media, unspecified, unspecified ear: Secondary | ICD-10-CM | POA: Insufficient documentation

## 2024-01-01 DIAGNOSIS — E038 Other specified hypothyroidism: Secondary | ICD-10-CM

## 2024-01-01 DIAGNOSIS — I4719 Other supraventricular tachycardia: Secondary | ICD-10-CM

## 2024-01-01 DIAGNOSIS — E1169 Type 2 diabetes mellitus with other specified complication: Secondary | ICD-10-CM

## 2024-01-01 DIAGNOSIS — J441 Chronic obstructive pulmonary disease with (acute) exacerbation: Secondary | ICD-10-CM | POA: Insufficient documentation

## 2024-01-01 DIAGNOSIS — K2100 Gastro-esophageal reflux disease with esophagitis, without bleeding: Secondary | ICD-10-CM

## 2024-01-01 DIAGNOSIS — J302 Other seasonal allergic rhinitis: Secondary | ICD-10-CM | POA: Insufficient documentation

## 2024-01-01 MED ORDER — OMEPRAZOLE 20 MG PO CPDR: 20.0000 mg | DELAYED_RELEASE_CAPSULE | Freq: Every day | ORAL | 1 refills | Status: DC

## 2024-01-01 MED ORDER — ALBUTEROL SULFATE HFA 108 (90 BASE) MCG/ACT IN AERS: 2.0000 | INHALATION_SPRAY | Freq: Four times a day (QID) | RESPIRATORY_TRACT | 3 refills | Status: AC | PRN

## 2024-01-01 MED ORDER — ALPRAZOLAM 0.25 MG PO TABS: 0.2500 mg | ORAL_TABLET | Freq: Two times a day (BID) | ORAL | 0 refills | Status: AC | PRN

## 2024-01-01 MED ORDER — METFORMIN HCL 500 MG PO TABS: ORAL_TABLET | ORAL | 0 refills | Status: DC

## 2024-01-01 MED ORDER — ALBUTEROL SULFATE (2.5 MG/3ML) 0.083% IN NEBU: 2.5000 mg | INHALATION_SOLUTION | Freq: Four times a day (QID) | RESPIRATORY_TRACT | 1 refills | Status: AC | PRN

## 2024-01-01 MED ORDER — LANCETS MISC. MISC
1.0000 | Freq: Three times a day (TID) | 0 refills | Status: AC
Start: 2024-01-01 — End: 2024-01-31

## 2024-01-01 MED ORDER — FLUTICASONE PROPIONATE 50 MCG/ACT NA SUSP: 2.0000 | Freq: Every day | NASAL | 5 refills | Status: AC

## 2024-01-01 MED ORDER — LEVOTHYROXINE SODIUM 25 MCG PO TABS: 25.0000 ug | ORAL_TABLET | Freq: Every day | ORAL | 1 refills | Status: DC

## 2024-01-01 MED ORDER — BLOOD GLUCOSE MONITORING SUPPL DEVI: 1.0000 | Freq: Three times a day (TID) | 0 refills | Status: AC

## 2024-01-01 NOTE — Assessment & Plan Note (Signed)
 Well controlled Lab Results  Component Value Date   TSH 2.240 12/29/2023  - Continue to take synthroid  25 mcg by mouth daily

## 2024-01-01 NOTE — Progress Notes (Signed)
 Subjective:  Patient ID: Lori Phelps, female    DOB: 06-29-1956  Age: 68 y.o. MRN: [id number]  Chief Complaint  Patient presents with   Hypothyroidism   Hyperlipidemia   Insomnia   COPD    HPI: Pt with history of hypertension and atrial tachycardia with nonsustained afib - she is currently following with Dr Alvenia Aus regularly - last appt was on 5/30 and she is scheduled for echocardiogram in a few weeks.  She sees him every 6 months  Current meds for these issues include cardizem  CD 240, Tamocor 50, metoprolol  25, lisinopril  20, lasix  20 and hydralazine  50mg  She denies chest pain/dyspnea today   Pt with history of NIDDM - she is currently taking metformin  500mg  bid - she came in for labwork before appt and hgb A1c is noted at 5.6 She is due for eye exam Voices no problems    Pt with history of hypothyroidism - TSH 2.240, asymptomatic currently on synthroid  25mcg every day. Voices no problems   Pt with history of hyperlipidemia - cardiology switched her to lipitor 40 in November. Cholesterol has improved. Labwork shows chol at 143, trig at 136 and LDL at 75  Pt with history of COPD - currently on ventolin  and Advair - requests refill of meds No current trouble breathing  Insomnia: States her husband passed away 8 weeks ago and is having difficulty sleeping. She tells be a friend gave her some xanax at its lowest dose which has helped her sleep.      01/01/2024    9:59 AM 02/24/2023    1:24 PM 05/17/2021    8:08 AM  Depression screen PHQ 2/9  Decreased Interest 0 0 0  Down, Depressed, Hopeless 0 0 0  PHQ - 2 Score 0 0 0  Altered sleeping 0 0   Tired, decreased energy 0 0   Change in appetite 0 0   Feeling bad or failure about yourself  0 0   Trouble concentrating 0 0   Moving slowly or fidgety/restless 0 0   Suicidal thoughts 0 0   PHQ-9 Score 0 0   Difficult doing work/chores Not difficult at all Not difficult at all         01/01/2024    9:59 AM  Fall Risk   Falls  in the past year? 0  Number falls in past yr: 0  Injury with Fall? 0  Risk for fall due to : No Fall Risks  Follow up Falls evaluation completed    Patient Care Team: Janece Means, FNP as PCP - General (Family Medicine) Wenona Hamilton, MD as PCP - Cardiology (Cardiology) Deterding, Royston Cornea, MD as Consulting Physician (Nephrology)   Review of Systems  Constitutional:  Negative for chills, fatigue and fever.  HENT:  Negative for congestion, ear pain, rhinorrhea and sore throat.   Respiratory:  Negative for cough and shortness of breath.   Cardiovascular:  Negative for chest pain.  Gastrointestinal:  Negative for abdominal pain, constipation, diarrhea, nausea and vomiting.  Genitourinary:  Negative for dysuria and urgency.  Musculoskeletal:  Negative for back pain and myalgias.  Neurological:  Negative for dizziness, weakness, light-headedness and headaches.  Psychiatric/Behavioral:  Positive for dysphoric mood (recent passing of her husband) and sleep disturbance.     Current Outpatient Medications on File Prior to Visit  Medication Sig Dispense Refill   aspirin EC 81 MG tablet Take 81 mg by mouth daily.     atorvastatin  (LIPITOR) 40 MG tablet Take  1 tablet (40 mg total) by mouth daily. 90 tablet 0   Blood Glucose Monitoring Suppl DEVI 1 each by Does not apply route in the morning, at noon, and at bedtime. May substitute to any manufacturer covered by patient's insurance. 1 each 0   diltiazem  (CARDIZEM  CD) 240 MG 24 hr capsule Take 1 capsule (240 mg total) by mouth daily. 90 capsule 0   flecainide  (TAMBOCOR ) 50 MG tablet Take 1 tablet (50 mg total) by mouth 2 (two) times daily. May take an extra tablet for palpitations 180 tablet 3   fluticasone -salmeterol (WIXELA INHUB) 250-50 MCG/ACT AEPB Inhale 1 puff into the lungs in the morning and at bedtime. 3 each 1   furosemide  (LASIX ) 20 MG tablet Take 1 tablet (20 mg total) by mouth every other day. 45 tablet 0   Glucose Blood (BLOOD  GLUCOSE TEST STRIPS) STRP 1 each by In Vitro route in the morning, at noon, and at bedtime. May substitute to any manufacturer covered by patient's insurance. 100 strip 0   hydrALAZINE  (APRESOLINE ) 50 MG tablet Take 1 tablet (50 mg total) by mouth 3 (three) times daily. 270 tablet 0   lisinopril  (ZESTRIL ) 10 MG tablet Take 1 tablet (10 mg total) by mouth in the morning and at bedtime. 180 tablet 0   metoprolol  tartrate (LOPRESSOR ) 25 MG tablet Take 0.5 tablets (12.5 mg total) by mouth 2 (two) times daily. 90 tablet 0   Multiple Vitamin (MULTIVITAMIN) capsule Take 1 capsule by mouth daily.     Omega-3 Fatty Acids (FISH OIL ) 435 MG CAPS Take 2 capsules (870 mg total) by mouth daily. 180 capsule 2   No current facility-administered medications on file prior to visit.   Past Medical History:  Diagnosis Date   Atrial tachycardia (HCC)    Chronic atrial fibrillation (HCC)    Diabetes mellitus without complication (HCC)    Essential hypertension    GERD (gastroesophageal reflux disease)    HLD (hyperlipidemia)    HTN (hypertension)    Hx of bronchitis    uses MDI prn and on advair daily but no dx of asthma    Hypothyroidism    Mixed hyperlipidemia    Overweight    Palpitations    hx PAC"S   Phlebitis and thrombophlebitis of deep vessels of lower extremities, left (HCC)    Type 2 diabetes mellitus with other specified complication [facility])    Past Surgical History:  Procedure Laterality Date   BACK SURGERY  09/08/1992   COLONOSCOPY  09/08/2000   DILATATION & CURETTAGE/HYSTEROSCOPY WITH MYOSURE N/A 08/22/2015   Procedure: DILATATION & CURETTAGE/HYSTEROSCOPY WITH MYOSURE AND POLYPECTOMY;  Surgeon: Rogene Claude, MD;  Location: [facility];  Service: Gynecology;  Laterality: N/A;   NASAL SINUS SURGERY     [id number]   REPLACEMENT TOTAL KNEE Left    03/2022    Family History  Problem Relation Age of Onset   Kidney disease Other    Kidney disease Mother    Heart failure Father    Colon cancer  Neg Hx    Colon polyps Neg Hx    Esophageal cancer Neg Hx    Rectal cancer Neg Hx    Stomach cancer Neg Hx    Social History   Socioeconomic History   Marital status: Widowed    Spouse name: Not on file   Number of children: 2   Years of education: Not on file   Highest education level: Not on file  Occupational History   Not on  file  Tobacco Use   Smoking status: Former   Smokeless tobacco: Never  Vaping Use   Vaping status: Never Used  Substance and Sexual Activity   Alcohol use: No    Alcohol/week: 0.0 standard drinks of alcohol   Drug use: No   Sexual activity: Not Currently  Other Topics Concern   Not on file  Social History Narrative   Pt lives in [place] with spouse.  Retired Engineer, water for [employer].   Social Drivers of Corporate investment banker Strain: Low Risk  (01/01/2024)   Overall Financial Resource Strain (CARDIA)    Difficulty of Paying Living Expenses: Not hard at all  Food Insecurity: No Food Insecurity (01/01/2024)   Hunger Vital Sign    Worried About Running Out of Food in the Last Year: Never true    Ran Out of Food in the Last Year: Never true  Transportation Needs: No Transportation Needs (01/01/2024)   PRAPARE - Administrator, Civil Service (Medical): No    Lack of Transportation (Non-Medical): No  Physical Activity: Inactive (01/01/2024)   Exercise Vital Sign    Days of Exercise per Week: 0 days    Minutes of Exercise per Session: 0 min  Stress: No Stress Concern Present (01/01/2024)   [employer] of Occupational Health - Occupational Stress Questionnaire    Feeling of Stress : Not at all  Social Connections: Moderately Integrated (01/01/2024)   Social Connection and Isolation Panel [NHANES]    Frequency of Communication with Friends and Family: More than three times a week    Frequency of Social Gatherings with Friends and Family: More than three times a week    Attends Religious Services: 1 to 4  times per year    Active Member of [employer] or Organizations: Yes    Attends Banker Meetings: More than 4 times per year    Marital Status: Widowed    Objective:  BP 104/60   Pulse 61   Temp 98.1 F (36.7 C)   Ht 5' 1.5" (1.562 m)   Wt 192 lb (87.1 kg)   LMP 05/21/2015 Comment: irregular since age 89   SpO2 99%   BMI 35.69 kg/m      01/01/2024    9:55 AM 08/20/2023   10:05 AM 02/24/2023    1:21 PM  BP/Weight  Systolic BP 104 118 104  Diastolic BP 60 60 60  Wt. (Lbs) 192 190.5 189  BMI 35.69 kg/m2 37.2 kg/m2 35.71 kg/m2    Physical Exam Vitals reviewed.  Constitutional:      General: She is not in acute distress.    Appearance: Normal appearance. She is not ill-appearing.  Eyes:     Conjunctiva/sclera: Conjunctivae normal.  Cardiovascular:     Rate and Rhythm: Normal rate and regular rhythm.     Heart sounds: Normal heart sounds. No murmur heard. Pulmonary:     Effort: Pulmonary effort is normal.     Breath sounds: Normal breath sounds. No wheezing.  Musculoskeletal:        General: Normal range of motion.     Cervical back: Normal range of motion.  Skin:    General: Skin is warm.  Neurological:     Mental Status: She is alert. Mental status is at baseline.  Psychiatric:        Mood and Affect: Mood normal.        Behavior: Behavior normal.     Lab Results  Component Value Date   WBC 7.6 12/29/2023   HGB 12.8 12/29/2023   HCT 37.8 12/29/2023   PLT 211 12/29/2023   GLUCOSE 92 12/29/2023   CHOL 143 12/29/2023   TRIG 136 12/29/2023   HDL 44 12/29/2023   LDLCALC 75 12/29/2023   ALT 17 12/29/2023   AST 22 12/29/2023   NA 138 12/29/2023   K 4.9 12/29/2023   CL 102 12/29/2023   CREATININE 1.06 (H) 12/29/2023   BUN 21 12/29/2023   CO2 21 12/29/2023   TSH 2.240 12/29/2023   HGBA1C 5.7 (H) 12/29/2023    Assessment & Plan:  Other specified hypothyroidism Assessment & Plan: Well controlled Lab Results  Component Value Date   TSH 2.240  12/29/2023  - Continue to take synthroid  25 mcg by mouth daily   Orders: -     Levothyroxine  Sodium; Take 1 tablet (25 mcg total) by mouth daily.  Dispense: 90 tablet; Refill: 1  Type 2 diabetes mellitus with other specified complication, unspecified whether long term insulin use (HCC) Assessment & Plan: Well controlled Lab Results  Component Value Date   HGBA1C 5.7 (H) 12/29/2023  - Continue to take metformin  500 mg by mouth TWICE A DAY with a meal. - Order CBG machine and supplies.  Orders: -     metFORMIN  HCl; TAKE 1 TABLET(500 MG) BY MOUTH TWICE DAILY WITH A MEAL  Dispense: 180 tablet; Refill: 0 -     Blood Glucose Monitoring Suppl; 1 each by Does not apply route in the morning, at noon, and at bedtime. May substitute to any manufacturer covered by patient's insurance.  Dispense: 1 each; Refill: 0 -     Lancets Misc.; 1 each by Does not apply route in the morning, at noon, and at bedtime. May substitute to any manufacturer covered by patient's insurance.  Dispense: 100 each; Refill: 0  Obstructive chronic bronchitis without exacerbation (HCC) Assessment & Plan: Well controlled on current medication regimen - Continue albuterol  as needed for wheezing and shortness of breath.   Orders: -     Albuterol  Sulfate; Take 3 mLs (2.5 mg total) by nebulization every 6 (six) hours as needed for wheezing or shortness of breath.  Dispense: 150 mL; Refill: 1 -     Albuterol  Sulfate HFA; Inhale 2 puffs into the lungs every 6 (six) hours as needed for wheezing or shortness of breath.  Dispense: 18 g; Refill: 3  Atrial tachycardia (HCC) Assessment & Plan: Follow current recommendations by cardiologist, Dr, Alvenia Aus    Gastroesophageal reflux disease with esophagitis without hemorrhage Assessment & Plan: Well controlled  - continue current medication management with omeprazole  20 mg by mouth daily  Orders: -     Omeprazole ; Take 1 capsule (20 mg total) by mouth daily.  Dispense: 90 capsule;  Refill: 1  Seasonal allergies Assessment & Plan: Acute Seasonal spring allergies to pollen. - requests refill on Flonase . - instructed on proper usage towards the ear administration.   Orders: -     Fluticasone  Propionate; Place 2 sprays into both nostrils daily.  Dispense: 16 g; Refill: 5  Situational insomnia Assessment & Plan: Acute - Due to recent loss of her husband, patient has been taking a friend's xanax to help her sleep. - Educated on CNS depression and taking with alcohol or other CNS depressants. - Xanax is only to be used on an as needed basis. - if continues to have trouble sleeping, will rx SSRI and recommend CBT with a counselor.   Orders: -  ALPRAZolam; Take 1 tablet (0.25 mg total) by mouth 2 (two) times daily as needed for anxiety.  Dispense: 20 tablet; Refill: 0     Meds ordered this encounter  Medications   albuterol  (PROVENTIL ) (2.5 MG/3ML) 0.083% nebulizer solution    Sig: Take 3 mLs (2.5 mg total) by nebulization every 6 (six) hours as needed for wheezing or shortness of breath.    Dispense:  150 mL    Refill:  1   albuterol  (VENTOLIN  HFA) 108 (90 Base) MCG/ACT inhaler    Sig: Inhale 2 puffs into the lungs every 6 (six) hours as needed for wheezing or shortness of breath.    Dispense:  18 g    Refill:  3   levothyroxine  (SYNTHROID ) 25 MCG tablet    Sig: Take 1 tablet (25 mcg total) by mouth daily.    Dispense:  90 tablet    Refill:  1   metFORMIN  (GLUCOPHAGE ) 500 MG tablet    Sig: TAKE 1 TABLET(500 MG) BY MOUTH TWICE DAILY WITH A MEAL    Dispense:  180 tablet    Refill:  0   omeprazole  (PRILOSEC) 20 MG capsule    Sig: Take 1 capsule (20 mg total) by mouth daily.    Dispense:  90 capsule    Refill:  1   Blood Glucose Monitoring Suppl DEVI    Sig: 1 each by Does not apply route in the morning, at noon, and at bedtime. May substitute to any manufacturer covered by patient's insurance.    Dispense:  1 each    Refill:  0   Lancets Misc. MISC     Sig: 1 each by Does not apply route in the morning, at noon, and at bedtime. May substitute to any manufacturer covered by patient's insurance.    Dispense:  100 each    Refill:  0   ALPRAZolam (XANAX) 0.25 MG tablet    Sig: Take 1 tablet (0.25 mg total) by mouth 2 (two) times daily as needed for anxiety.    Dispense:  20 tablet    Refill:  0   fluticasone  (FLONASE ) 50 MCG/ACT nasal spray    Sig: Place 2 sprays into both nostrils daily.    Dispense:  16 g    Refill:  5    Orders Placed This Encounter  Procedures   [facility] MAMMOGRAPHY     Follow-up: Return in about 1 year (around 12/31/2024) for chronic.  An After Visit Summary was printed and given to the patient.  Delford Felling, FNP [facility] [phone]

## 2024-01-01 NOTE — Assessment & Plan Note (Signed)
 Well controlled on current medication regimen - Continue albuterol  as needed for wheezing and shortness of breath.

## 2024-01-01 NOTE — Assessment & Plan Note (Signed)
 Acute Seasonal spring allergies to pollen. - requests refill on Flonase . - instructed on proper usage towards the ear administration.

## 2024-01-01 NOTE — Assessment & Plan Note (Signed)
 Well controlled  - continue current medication management with omeprazole  20 mg by mouth daily

## 2024-01-01 NOTE — Assessment & Plan Note (Signed)
 Well controlled Lab Results  Component Value Date   HGBA1C 5.7 (H) 12/29/2023  - Continue to take metformin  500 mg by mouth TWICE A DAY with a meal. - Order CBG machine and supplies.

## 2024-01-01 NOTE — Assessment & Plan Note (Signed)
 Acute - Due to recent loss of her husband, patient has been taking a friend's xanax to help her sleep. - Educated on CNS depression and taking with alcohol or other CNS depressants. - Xanax is only to be used on an as needed basis. - if continues to have trouble sleeping, will rx SSRI and recommend CBT with a counselor.

## 2024-01-01 NOTE — Assessment & Plan Note (Signed)
 Follow current recommendations by cardiologist, Dr, Alvenia Aus

## 2024-03-10 ENCOUNTER — Ambulatory Visit: Admitting: Cardiovascular Disease

## 2024-04-19 ENCOUNTER — Ambulatory Visit: Admitting: Cardiovascular Disease

## 2024-04-19 ENCOUNTER — Ambulatory Visit: Attending: Cardiovascular Disease | Admitting: Cardiovascular Disease

## 2024-04-19 ENCOUNTER — Encounter: Payer: Self-pay | Admitting: Cardiovascular Disease

## 2024-04-19 VITALS — BP 105/66 | HR 69 | Ht 61.0 in | Wt 193.4 lb

## 2024-04-19 DIAGNOSIS — R002 Palpitations: Secondary | ICD-10-CM | POA: Diagnosis present

## 2024-04-19 DIAGNOSIS — I491 Atrial premature depolarization: Secondary | ICD-10-CM | POA: Diagnosis present

## 2024-04-19 DIAGNOSIS — I10 Essential (primary) hypertension: Secondary | ICD-10-CM | POA: Insufficient documentation

## 2024-04-19 DIAGNOSIS — E785 Hyperlipidemia, unspecified: Secondary | ICD-10-CM | POA: Diagnosis present

## 2024-04-19 DIAGNOSIS — R011 Cardiac murmur, unspecified: Secondary | ICD-10-CM | POA: Insufficient documentation

## 2024-04-19 DIAGNOSIS — I4719 Other supraventricular tachycardia: Secondary | ICD-10-CM | POA: Insufficient documentation

## 2024-04-19 MED ORDER — METOPROLOL TARTRATE 25 MG PO TABS: 12.5000 mg | ORAL_TABLET | Freq: Two times a day (BID) | ORAL | 3 refills | Status: AC

## 2024-04-19 MED ORDER — FLECAINIDE ACETATE 50 MG PO TABS: 50.0000 mg | ORAL_TABLET | Freq: Two times a day (BID) | ORAL | 3 refills | Status: DC

## 2024-04-19 MED ORDER — LISINOPRIL 10 MG PO TABS: 10.0000 mg | ORAL_TABLET | Freq: Two times a day (BID) | ORAL | 3 refills | Status: AC

## 2024-04-19 MED ORDER — FUROSEMIDE 20 MG PO TABS: 20.0000 mg | ORAL_TABLET | ORAL | 3 refills | Status: DC

## 2024-04-19 MED ORDER — ATORVASTATIN CALCIUM 40 MG PO TABS: 40.0000 mg | ORAL_TABLET | Freq: Every day | ORAL | 3 refills | Status: AC

## 2024-04-19 MED ORDER — DILTIAZEM HCL ER COATED BEADS 240 MG PO CP24: 240.0000 mg | ORAL_CAPSULE | Freq: Every day | ORAL | 3 refills | Status: AC

## 2024-04-19 MED ORDER — HYDRALAZINE HCL 50 MG PO TABS: 50.0000 mg | ORAL_TABLET | Freq: Two times a day (BID) | ORAL | 3 refills | Status: DC

## 2024-04-19 NOTE — Patient Instructions (Signed)
 Medication Instructions:  DECREASE the Hydralazine  to 50 mg twice daily  *If you need a refill on your cardiac medications before your next appointment, please call your pharmacy*  Lab Work: None ordered If you have labs (blood work) drawn today and your tests are completely normal, you will receive your results only by: MyChart Message (if you have MyChart) OR A paper copy in the mail If you have any lab test that is abnormal or we need to change your treatment, we will call you to review the results.  Testing/Procedures: None ordered  Follow-Up: At [facility], you and your health needs are our priority.  As part of our continuing mission to provide you with exceptional heart care, our providers are all part of one team.  This team includes your primary Cardiologist (physician) and Advanced Practice Providers or APPs (Physician Assistants and Nurse Practitioners) who all work together to provide you with the care you need, when you need it.  Your next appointment:   9 month(s)  Provider:   You may see Deatrice Cage, MD or one of the following Advanced Practice Providers on your designated Care Team:   Lonni Meager, NP Lesley Maffucci, PA-C Bernardino Bring, PA-C Cadence [place], PA-C Tylene Lunch, NP Barnie Hila, NP    We recommend signing up for the patient portal called MyChart.  Sign up information is provided on this After Visit Summary.  MyChart is used to connect with patients for Virtual Visits (Telemedicine).  Patients are able to view lab/test results, encounter notes, upcoming appointments, etc.  Non-urgent messages can be sent to your provider as well.   To learn more about what you can do with MyChart, go to [email or web address].

## 2024-04-19 NOTE — Progress Notes (Signed)
 Cardiology Office Note   Date:  04/19/2024   ID:  Lori Phelps, Lori Phelps July 06, 1956, MRN [id number]  PCP:  Lori Phelps [name], FNP  Cardiologist:   Deatrice Cage, MD   Chief Complaint  Patient presents with   Follow-up    6 Month f/u c/o pt;s husband passed away 5 months ago pt has been having irregular heart rhythm. Request all cardiac meds filled 90 days meds pended. Meds reviewed verbally with pt.       History of Present Illness: Lori Phelps is a 68 y.o. female who presents for a follow-up visit regarding palpitations due to atrial tachycardia and brief nonsustained atrial fibrillation.  She has type 2 diabetes, essential hypertension and hypothyroidism. She was seen in the past by Dr. Kelsie for arrhythmia management.    Symptoms have been controlled with flecainide , diltiazem  and metoprolol .  It was felt that the majority of her rhythm issues were related to atrial tachycardia and not atrial fibrillation and thus anticoagulation has not been recommended. Most recent echocardiogram in April 2021 showed normal LV systolic function, indeterminate diastolic function, normal pulmonary pressure and no significant valvular abnormalities.  She had atypical chest pain in the past and had a Lexiscan  Myoview  in September 2021 which showed no evidence of ischemia with normal ejection fraction.    She had an echocardiogram done in July of 2024 which showed normal LV systolic function.  Aortic valve was calcified but no significant stenosis.  Unfortunately, her husband died about 6 months ago of a brain bleed.  She is adjusting well though.  She denies chest pain, shortness of breath or palpitations. .  Past Medical History:  Diagnosis Date   Atrial tachycardia (HCC)    Chronic atrial fibrillation (HCC)    Diabetes mellitus without complication (HCC)    Essential hypertension    GERD (gastroesophageal reflux disease)    HLD (hyperlipidemia)    HTN (hypertension)    Hx of bronchitis     uses MDI prn and on advair daily but no dx of asthma    Hypothyroidism    Mixed hyperlipidemia    Overweight    Palpitations    hx PACS   Phlebitis and thrombophlebitis of deep vessels of lower extremities, left (HCC)    Type 2 diabetes mellitus with other specified complication [facility])     Past Surgical History:  Procedure Laterality Date   BACK SURGERY  09/08/1992   COLONOSCOPY  09/08/2000   DILATATION & CURETTAGE/HYSTEROSCOPY WITH MYOSURE N/A 08/22/2015   Procedure: DILATATION & CURETTAGE/HYSTEROSCOPY WITH MYOSURE AND POLYPECTOMY;  Surgeon: Nathanel Bunker, MD;  Location: [facility];  Service: Gynecology;  Laterality: N/A;   NASAL SINUS SURGERY     [id number]   REPLACEMENT TOTAL KNEE Left    03/2022     Current Outpatient Medications  Medication Sig Dispense Refill   albuterol  (PROVENTIL ) (2.5 MG/3ML) 0.083% nebulizer solution Take 3 mLs (2.5 mg total) by nebulization every 6 (six) hours as needed for wheezing or shortness of breath. 150 mL 1   albuterol  (VENTOLIN  HFA) 108 (90 Base) MCG/ACT inhaler Inhale 2 puffs into the lungs every 6 (six) hours as needed for wheezing or shortness of breath. 18 g 3   ALPRAZolam  (XANAX ) 0.25 MG tablet Take 1 tablet (0.25 mg total) by mouth 2 (two) times daily as needed for anxiety. 20 tablet 0   aspirin EC 81 MG tablet Take 81 mg by mouth daily.     atorvastatin  (LIPITOR) 40 MG tablet  Take 1 tablet (40 mg total) by mouth daily. 90 tablet 0   Blood Glucose Monitoring Suppl DEVI 1 each by Does not apply route in the morning, at noon, and at bedtime. May substitute to any manufacturer covered by patient's insurance. 1 each 0   Blood Glucose Monitoring Suppl DEVI 1 each by Does not apply route in the morning, at noon, and at bedtime. May substitute to any manufacturer covered by patient's insurance. 1 each 0   diltiazem  (CARDIZEM  CD) 240 MG 24 hr capsule Take 1 capsule (240 mg total) by mouth daily. 90 capsule 0   flecainide  (TAMBOCOR ) 50 MG tablet Take 1  tablet (50 mg total) by mouth 2 (two) times daily. May take an extra tablet for palpitations 180 tablet 3   fluticasone  (FLONASE ) 50 MCG/ACT nasal spray Place 2 sprays into both nostrils daily. 16 g 5   fluticasone -salmeterol (WIXELA INHUB) 250-50 MCG/ACT AEPB Inhale 1 puff into the lungs in the morning and at bedtime. 3 each 1   furosemide  (LASIX ) 20 MG tablet Take 1 tablet (20 mg total) by mouth every other day. 45 tablet 0   Glucose Blood (BLOOD GLUCOSE TEST STRIPS) STRP 1 each by In Vitro route in the morning, at noon, and at bedtime. May substitute to any manufacturer covered by patient's insurance. 100 strip 0   hydrALAZINE  (APRESOLINE ) 50 MG tablet Take 1 tablet (50 mg total) by mouth 3 (three) times daily. 270 tablet 0   levothyroxine  (SYNTHROID ) 25 MCG tablet Take 1 tablet (25 mcg total) by mouth daily. 90 tablet 1   lisinopril  (ZESTRIL ) 10 MG tablet Take 1 tablet (10 mg total) by mouth in the morning and at bedtime. 180 tablet 0   metFORMIN  (GLUCOPHAGE ) 500 MG tablet TAKE 1 TABLET(500 MG) BY MOUTH TWICE DAILY WITH A MEAL 180 tablet 0   metoprolol  tartrate (LOPRESSOR ) 25 MG tablet Take 0.5 tablets (12.5 mg total) by mouth 2 (two) times daily. 90 tablet 0   Multiple Vitamin (MULTIVITAMIN) capsule Take 1 capsule by mouth daily.     Omega-3 Fatty Acids (FISH OIL ) 435 MG CAPS Take 2 capsules (870 mg total) by mouth daily. 180 capsule 2   omeprazole  (PRILOSEC) 20 MG capsule Take 1 capsule (20 mg total) by mouth daily. 90 capsule 1   No current facility-administered medications for this visit.    Allergies:   Penicillins, Sulfur dioxide, and Sulfonamide derivatives    Social History:  The patient  reports that she has quit smoking. She has never used smokeless tobacco. She reports that she does not drink alcohol and does not use drugs.   Family History:  The patient's family history includes Heart failure in her father; Kidney disease in her mother and another family member.    ROS:   Please see the history of present illness.   Otherwise, review of systems are positive for none.   All other systems are reviewed and negative.    PHYSICAL EXAM: VS:  BP 105/66 (BP Location: Left Arm, Patient Position: Sitting, Cuff Size: Large)   Pulse 69   Ht 5' 1 (1.549 m)   Wt 193 lb 6 oz (87.7 kg)   LMP 05/21/2015 Comment: irregular since age 78   SpO2 97%   BMI 36.54 kg/m  , BMI Body mass index is 36.54 kg/m. GEN: Well nourished, well developed, in no acute distress  HEENT: normal  Neck: no JVD, carotid bruits, or masses Cardiac: RRR; no  rubs, or gallops,no edema .  2/ 6 systolic murmur in the aortic area which is early peaking Respiratory:  clear to auscultation bilaterally, normal work of breathing GI: soft, nontender, nondistended, + BS MS: no deformity or atrophy  Skin: warm and dry, no rash Neuro:  Strength and sensation are intact Psych: euthymic mood, full affect   EKG:  EKG is ordered today. EKG f was reviewed and showed: Normal sinus rhythm Low voltage QRS   Recent Labs: 12/29/2023: ALT 17; BUN 21; Creatinine, Ser 1.06; Hemoglobin 12.8; Platelets 211; Potassium 4.9; Sodium 138; TSH 2.240    Lipid Panel    Component Value Date/Time   CHOL 143 12/29/2023 0753   TRIG 136 12/29/2023 0753   HDL 44 12/29/2023 0753   CHOLHDL 3.3 12/29/2023 0753   LDLCALC 75 12/29/2023 0753      Wt Readings from Last 3 Encounters:  04/19/24 193 lb 6 oz (87.7 kg)  01/01/24 192 lb (87.1 kg)  08/20/23 190 lb 8 oz (86.4 kg)       ASSESSMENT AND PLAN:  1.  Palpitations due to PACs, atrial tachycardia and nonsustained atrial fibrillation:  Symptoms are well controlled on current medications including flecainide , metoprolol  and diltiazem .    No plans for anticoagulation at the present time as the majority of her rhythm issues were felt to be due to atrial tachycardia and not atrial fibrillation.  2. Essential hypertension: Blood pressure has been running on the low side.  I  elected to change hydralazine  to 50 mg twice daily.  We can consider increasing lisinopril  and ultimately stopping hydralazine  altogether.  3. Diabetes mellitus:  managed by primary care physician.  4.  Hyperlipidemia: Significant improvement in LDL after switching to atorvastatin .  Most recent lipid profile showed an LDL of 75.  5.  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  Echocardiogram this year showed no significant stenosis.   Disposition:   Follow-up in 9 months.  Signed,  Deatrice Cage, MD  04/19/2024 8:42 AM    [facility]

## 2024-05-12 ENCOUNTER — Other Ambulatory Visit: Payer: Self-pay | Admitting: Family Medicine

## 2024-05-12 DIAGNOSIS — K2100 Gastro-esophageal reflux disease with esophagitis, without bleeding: Secondary | ICD-10-CM

## 2024-05-12 DIAGNOSIS — E1169 Type 2 diabetes mellitus with other specified complication: Secondary | ICD-10-CM

## 2024-05-12 DIAGNOSIS — E038 Other specified hypothyroidism: Secondary | ICD-10-CM

## 2024-05-12 MED ORDER — LEVOTHYROXINE SODIUM 25 MCG PO TABS: 25.0000 ug | ORAL_TABLET | Freq: Every day | ORAL | 1 refills | Status: DC

## 2024-05-12 MED ORDER — OMEPRAZOLE 20 MG PO CPDR: 20.0000 mg | DELAYED_RELEASE_CAPSULE | Freq: Every day | ORAL | 1 refills | Status: DC

## 2024-05-12 MED ORDER — METFORMIN HCL 500 MG PO TABS: ORAL_TABLET | ORAL | 0 refills | Status: DC

## 2024-05-12 NOTE — Telephone Encounter (Signed)
 Copied from CRM [id number]. Topic: Clinical - Medication Refill >> May 12, 2024 11:21 AM Pinkey ORN wrote: Medication: metFORMIN  (GLUCOPHAGE ) 500 MG tablet, omeprazole  (PRILOSEC) 20 MG capsule, levothyroxine  (SYNTHROID ) 25 MCG tablet  Has the patient contacted their pharmacy? No (Agent: If no, request that the patient contact the pharmacy for the refill. If patient does not wish to contact the pharmacy document the reason why and proceed with request.) (Agent: If yes, when and what did the pharmacy advise?)  This is the patient's preferred pharmacy:  [facility] - [place], [place] - [address] [place] [address] Phone: [phone] Fax: [phone]    Is this the correct pharmacy for this prescription? Yes If no, delete pharmacy and type the correct one.   Has the prescription been filled recently? No  Is the patient out of the medication? No  Has the patient been seen for an appointment in the last year OR does the patient have an upcoming appointment? Yes  Can we respond through MyChart? Yes  Agent: Please be advised that Rx refills may take up to 3 business days. We ask that you follow-up with your pharmacy.

## 2024-08-30 ENCOUNTER — Other Ambulatory Visit: Payer: Self-pay | Admitting: Family Medicine

## 2024-08-30 DIAGNOSIS — E1169 Type 2 diabetes mellitus with other specified complication: Secondary | ICD-10-CM

## 2024-09-13 ENCOUNTER — Other Ambulatory Visit: Payer: Self-pay | Admitting: Family Medicine

## 2024-09-13 ENCOUNTER — Ambulatory Visit: Payer: Self-pay

## 2024-09-13 DIAGNOSIS — J4489 Other specified chronic obstructive pulmonary disease: Secondary | ICD-10-CM

## 2024-09-13 MED ORDER — FLUTICASONE-SALMETEROL 250-50 MCG/ACT IN AEPB: 1.0000 | INHALATION_SPRAY | Freq: Two times a day (BID) | RESPIRATORY_TRACT | 1 refills | Status: AC

## 2024-09-13 NOTE — Telephone Encounter (Signed)
 FYI Only or Action Required?: FYI only for provider: appointment scheduled on 09/15/24.  Patient was last seen in primary care on 01/01/2024 by Teressa Harrie HERO, FNP.  Called Nurse Triage reporting Wheezing.  Symptoms began several weeks ago.  Interventions attempted: Prescription medications: Nebulizer, doxycycline , steroid.  Symptoms are: gradually improving.  Triage Disposition: See PCP When Office is Open (Within 3 Days)  Patient/caregiver understands and will follow disposition?: Yes  Reason for Disposition  [1] MODERATE longstanding difficulty breathing (e.g., speaks in phrases, SOB even at rest, pulse 100-120) AND [2] SAME as normal  Answer Assessment - Initial Assessment Questions [facility]en in [facility] 12/[facility] for cold symptoms, given steroids and doxycycline , went back today, mild wheezing in right lung, shot of steroid.  Using Nebulizer, out of inhaler and needs refill, refill sent to clinic for approval.. Looking to see PCP for f/u.   1. RESPIRATORY STATUS: Describe your breathing? (e.g., wheezing, shortness of breath, unable to speak, severe coughing)      Wheezing, mild SOB  2. ONSET: When did this breathing problem begin?      2 weeks ago  3. PATTERN Does the difficult breathing come and go, or has it been constant since it started?      Constant, SOB is with exertion  4. SEVERITY: How bad is your breathing? (e.g., mild, moderate, severe)      Better from before, but resolved.   5. RECURRENT SYMPTOM: Have you had difficulty breathing before? If Yes, ask: When was the last time? and What happened that time?      Denies  6. CARDIAC HISTORY: Do you have any history of heart disease? (e.g., heart attack, angina, bypass surgery, angioplasty)      Hypertension  7. LUNG HISTORY: Do you have any history of lung disease?  (e.g., pulmonary embolus, asthma, emphysema)     COPD  8. CAUSE: What do you think is causing the breathing problem?      URI  9. OTHER  SYMPTOMS: Do you have any other symptoms? (e.g., chest pain, cough, dizziness, fever, runny nose)     Denies  10. O2 SATURATION MONITOR:  Do you use an oxygen saturation monitor (pulse oximeter) at home? If Yes, ask: What is your reading (oxygen level) today? What is your usual oxygen saturation reading? (e.g., 95%)       96% at UC today  Protocols used: Breathing Difficulty-A-AH  Copied from CRM #[id number]. Topic: Clinical - Red Word Triage >> Sep 13, 2024  1:27 PM Victoria B wrote: [facility] that prompted transfer to Nurse Triage: Patient has wheezing going on

## 2024-09-13 NOTE — Telephone Encounter (Signed)
 Copied from CRM [id number]. Topic: Clinical - Medication Refill >> Sep 13, 2024  1:24 PM Victoria B wrote: Medication: fluticasone -salmeterol (WIXELA INHUB) 250-50 MCG/ACT AEPB  Has the patient contacted their pharmacy? no   This is the patient's preferred pharmacy:   [id number], [place] - [address] [place] [address] Phone: [phone] Fax: [phone]  Is this the correct pharmacy for this prescription? yes Has the prescription been filled recently? no  Is the patient out of the medication? yes  Has the patient been seen for an appointment in the last year OR does the patient have an upcoming appointment? yes  Can we respond through MyChart? yes  Agent: Please be advised that Rx refills may take up to 3 business days. We ask that you follow-up with your pharmacy.

## 2024-09-14 ENCOUNTER — Other Ambulatory Visit: Payer: Self-pay | Admitting: Physician Assistant

## 2024-09-14 DIAGNOSIS — I4719 Other supraventricular tachycardia: Secondary | ICD-10-CM

## 2024-09-14 DIAGNOSIS — E1169 Type 2 diabetes mellitus with other specified complication: Secondary | ICD-10-CM

## 2024-09-14 DIAGNOSIS — K2100 Gastro-esophageal reflux disease with esophagitis, without bleeding: Secondary | ICD-10-CM

## 2024-09-14 DIAGNOSIS — I10 Essential (primary) hypertension: Secondary | ICD-10-CM

## 2024-09-14 DIAGNOSIS — E038 Other specified hypothyroidism: Secondary | ICD-10-CM

## 2024-09-15 ENCOUNTER — Ambulatory Visit (INDEPENDENT_AMBULATORY_CARE_PROVIDER_SITE_OTHER): Admitting: Family Medicine

## 2024-09-15 ENCOUNTER — Encounter: Payer: Self-pay | Admitting: Family Medicine

## 2024-09-15 VITALS — BP 108/64 | HR 63 | Temp 97.8°F | Resp 18 | Ht 61.0 in | Wt 191.9 lb

## 2024-09-15 DIAGNOSIS — J441 Chronic obstructive pulmonary disease with (acute) exacerbation: Secondary | ICD-10-CM

## 2024-09-15 NOTE — Assessment & Plan Note (Signed)
 Acute (negative Flu and Covid when checked last week) Improving after recent dose on doxycycline , oral steroids and prednisone  injection at [facility] near where she lives. Wheezing in the RLL on auscultation. Reports of feeling 50% better from recent exacerbation.  - Continue albuterol  as needed for wheezing and shortness of breath. - Advised to continue fluticasone -salmeterol once in the morning at @ bedtime to continue to help the inflammation.  - Recommended Delsym for cough and Mucinex for congestion. - Increase fluid intake.

## 2024-09-15 NOTE — Progress Notes (Signed)
 "  Acute Office Visit  Subjective:    Patient ID: Lori Phelps, female    DOB: [birth year], 69 y.o.   MRN: [id number]  Chief Complaint  Patient presents with   Wheezing    HPI: Patient is in today for persistent wheezing from recent COPD exacerbation and upper respiratory infection.  She was seen at an urgent care and her wound that prescribed her a round of doxycycline  and a prednisone  oral regimen.  She later returned on Tuesday of this week and received a prednisone  injection for continued wheezing.  She reports using her albuterol  nebulizer treatments every 6 hours at home.  She still uses a cough medication with a decongestant but she does not remember the name of.  She has not been compliant with her fluticasone -salmeterol inhaler that she is prescribed to take once in the morning and once at bedtime.  She reports that it causes her tongue to crack and her voice to be very hoarse. Overall she feels 50% better after the prednisone  injection and was advised to follow-up with her PCP due to wheezing in the RUL.     Past Medical History:  Diagnosis Date   Atrial tachycardia    Chronic atrial fibrillation (HCC)    Diabetes mellitus without complication (HCC)    Essential hypertension    GERD (gastroesophageal reflux disease)    HLD (hyperlipidemia)    HTN (hypertension)    Hx of bronchitis    uses MDI prn and on advair daily but no dx of asthma    Hypothyroidism    Mixed hyperlipidemia    Overweight    Palpitations    hx PACS   Phlebitis and thrombophlebitis of deep vessels of lower extremities, left (HCC)    Type 2 diabetes mellitus with other specified complication [facility])     Past Surgical History:  Procedure Laterality Date   BACK SURGERY  09/08/1992   COLONOSCOPY  09/08/2000   DILATATION & CURETTAGE/HYSTEROSCOPY WITH MYOSURE N/A 08/22/2015   Procedure: DILATATION & CURETTAGE/HYSTEROSCOPY WITH MYOSURE AND POLYPECTOMY;  Surgeon: Nathanel Bunker, MD;  Location: [facility];   Service: Gynecology;  Laterality: N/A;   NASAL SINUS SURGERY     [id number]   REPLACEMENT TOTAL KNEE Left    03/2022    Family History  Problem Relation Age of Onset   Kidney disease Other    Kidney disease Mother    Heart failure Father    Colon cancer Neg Hx    Colon polyps Neg Hx    Esophageal cancer Neg Hx    Rectal cancer Neg Hx    Stomach cancer Neg Hx     Social History   Socioeconomic History   Marital status: Widowed    Spouse name: Not on file   Number of children: 2   Years of education: Not on file   Highest education level: Not on file  Occupational History   Not on file  Tobacco Use   Smoking status: Former   Smokeless tobacco: Never  Vaping Use   Vaping status: Never Used  Substance and Sexual Activity   Alcohol use: No    Alcohol/week: 0.0 standard drinks of alcohol   Drug use: No   Sexual activity: Not Currently  Other Topics Concern   Not on file  Social History Narrative   Pt lives in [place] with spouse.  Retired engineer, water for [employer].   Social Drivers of Health   Tobacco Use: Medium Risk (09/15/2024)  Patient History    Smoking Tobacco Use: Former    Smokeless Tobacco Use: Never    Passive Exposure: Not on file  Financial Resource Strain: Low Risk (01/01/2024)   Overall Financial Resource Strain (CARDIA)    Difficulty of Paying Living Expenses: Not hard at all  Food Insecurity: No Food Insecurity (01/01/2024)   Hunger Vital Sign    Worried About Running Out of Food in the Last Year: Never true    Ran Out of Food in the Last Year: Never true  Transportation Needs: No Transportation Needs (01/01/2024)   PRAPARE - Administrator, Civil Service (Medical): No    Lack of Transportation (Non-Medical): No  Physical Activity: Inactive (01/01/2024)   Exercise Vital Sign    Days of Exercise per Week: 0 days    Minutes of Exercise per Session: 0 min  Stress: No Stress Concern Present (01/01/2024)   [employer] - Occupational Stress Questionnaire    Feeling of Stress : Not at all  Social Connections: Moderately Integrated (01/01/2024)   Social Connection and Isolation Panel    Frequency of Communication with Friends and Family: More than three times a week    Frequency of Social Gatherings with Friends and Family: More than three times a week    Attends Religious Services: 1 to 4 times per year    Active Member of [employer] or Organizations: Yes    Attends Banker Meetings: More than 4 times per year    Marital Status: Widowed  Intimate Partner Violence: Not At Risk (01/01/2024)   Humiliation, Afraid, Rape, and Kick questionnaire    Fear of Current or Ex-Partner: No    Emotionally Abused: No    Physically Abused: No    Sexually Abused: No  Depression (PHQ2-9): Low Risk (01/01/2024)   Depression (PHQ2-9)    PHQ-2 Score: 0  Alcohol Screen: Low Risk (01/01/2024)   Alcohol Screen    Last Alcohol Screening Score (AUDIT): 0  Housing: Low Risk (01/01/2024)   Housing Stability Vital Sign    Unable to Pay for Housing in the Last Year: No    Number of Times Moved in the Last Year: 0    Homeless in the Last Year: No  Utilities: Not At Risk (01/01/2024)   AHC Utilities    Threatened with loss of utilities: No  Health Literacy: Adequate Health Literacy (01/01/2024)   B1300 Health Literacy    Frequency of need for help with medical instructions: Never    Outpatient Medications Prior to Visit  Medication Sig Dispense Refill   albuterol  (PROVENTIL ) (2.5 MG/3ML) 0.083% nebulizer solution Take 3 mLs (2.5 mg total) by nebulization every 6 (six) hours as needed for wheezing or shortness of breath. 150 mL 1   albuterol  (VENTOLIN  HFA) 108 (90 Base) MCG/ACT inhaler Inhale 2 puffs into the lungs every 6 (six) hours as needed for wheezing or shortness of breath. 18 g 3   aspirin EC 81 MG tablet Take 81 mg by mouth daily.     atorvastatin  (LIPITOR) 40 MG tablet Take 1 tablet  (40 mg total) by mouth daily. 90 tablet 3   Blood Glucose Monitoring Suppl DEVI 1 each by Does not apply route in the morning, at noon, and at bedtime. May substitute to any manufacturer covered by patient's insurance. 1 each 0   Blood Glucose Monitoring Suppl DEVI 1 each by Does not apply route in the morning, at noon, and at bedtime. May substitute  to any manufacturer covered by patient's insurance. 1 each 0   diltiazem  (CARDIZEM  CD) 240 MG 24 hr capsule Take 1 capsule (240 mg total) by mouth daily. 90 capsule 3   flecainide  (TAMBOCOR ) 50 MG tablet TAKE 1 TABLET(50 MG) BY MOUTH TWICE DAILY. MAY TAKE AN EXTRA TABLET FOR PALPITATIONS 180 tablet 3   fluticasone  (FLONASE ) 50 MCG/ACT nasal spray Place 2 sprays into both nostrils daily. 16 g 5   fluticasone -salmeterol (WIXELA INHUB) 250-50 MCG/ACT AEPB Inhale 1 puff into the lungs in the morning and at bedtime. 3 each 1   furosemide  (LASIX ) 20 MG tablet TAKE 1 TABLET(20 MG) BY MOUTH EVERY OTHER DAY 45 tablet 3   Glucose Blood (BLOOD GLUCOSE TEST STRIPS) STRP 1 each by In Vitro route in the morning, at noon, and at bedtime. May substitute to any manufacturer covered by patient's insurance. 100 strip 0   hydrALAZINE  (APRESOLINE ) 50 MG tablet TAKE 1 TABLET(50 MG) BY MOUTH THREE TIMES DAILY 270 tablet 0   levothyroxine  (SYNTHROID ) 25 MCG tablet TAKE 1 TABLET(25 MCG) BY MOUTH DAILY 90 tablet 1   lisinopril  (ZESTRIL ) 10 MG tablet Take 1 tablet (10 mg total) by mouth in the morning and at bedtime. 180 tablet 3   metFORMIN  (GLUCOPHAGE ) 500 MG tablet TAKE 1 TABLET(500 MG) BY MOUTH TWICE DAILY WITH A MEAL 180 tablet 0   metoprolol  tartrate (LOPRESSOR ) 25 MG tablet Take 0.5 tablets (12.5 mg total) by mouth 2 (two) times daily. 90 tablet 3   Multiple Vitamin (MULTIVITAMIN) capsule Take 1 capsule by mouth daily.     Omega-3 Fatty Acids (FISH OIL ) 435 MG CAPS Take 2 capsules (870 mg total) by mouth daily. 180 capsule 2   omeprazole  (PRILOSEC) 20 MG capsule TAKE 1  CAPSULE(20 MG) BY MOUTH DAILY 90 capsule 1   ALPRAZolam  (XANAX ) 0.25 MG tablet Take 1 tablet (0.25 mg total) by mouth 2 (two) times daily as needed for anxiety. (Patient not taking: Reported on 09/15/2024) 20 tablet 0   No facility-administered medications prior to visit.    Allergies[1]  Review of Systems  Constitutional:  Negative for chills, diaphoresis, fatigue and fever.  HENT:  Positive for congestion. Negative for ear pain and sinus pain.   Respiratory:  Positive for cough, shortness of breath and wheezing.   Cardiovascular:  Negative for chest pain.  Gastrointestinal:  Negative for abdominal pain, constipation, nausea and vomiting.  Genitourinary:  Negative for dysuria.  Musculoskeletal:  Negative for arthralgias.  Neurological:  Negative for weakness and headaches.  Psychiatric/Behavioral:  Negative for dysphoric mood. The patient is not nervous/anxious.        Objective:        09/15/2024    1:24 PM 04/19/2024    8:15 AM 01/01/2024    9:55 AM  Vitals with BMI  Height 5' 1 5' 1 5' 1.5  Weight 191 lbs 14 oz 193 lbs 6 oz 192 lbs  BMI 36.28 36.56 35.7  Systolic 108 105 895  Diastolic 64 66 60  Pulse 63 69 61    No data found.   Physical Exam Vitals reviewed.  Constitutional:      General: She is not in acute distress.    Appearance: Normal appearance. She is not ill-appearing.  HENT:     Mouth/Throat:     Pharynx: No posterior oropharyngeal erythema.  Eyes:     Conjunctiva/sclera: Conjunctivae normal.  Cardiovascular:     Rate and Rhythm: Normal rate and regular rhythm.  Heart sounds: Normal heart sounds. No murmur heard. Pulmonary:     Effort: Pulmonary effort is normal. No respiratory distress.     Breath sounds: Wheezing (RLL) present. No rhonchi.  Abdominal:     General: Bowel sounds are normal.     Palpations: Abdomen is soft.     Tenderness: There is no abdominal tenderness.  Musculoskeletal:        General: Normal range of motion.  Skin:     General: Skin is warm.  Neurological:     Mental Status: She is alert and oriented to person, place, and time. Mental status is at baseline.  Psychiatric:        Mood and Affect: Mood normal.        Behavior: Behavior normal.     Health Maintenance Due  Topic Date Due   Medicare Annual Wellness (AWV)  Never done   FOOT EXAM  Never done   OPHTHALMOLOGY EXAM  03/06/2022   Diabetic kidney evaluation - Urine ACR  02/24/2024   Influenza Vaccine  04/08/2024   Mammogram  04/08/2024   COVID-19 Vaccine (8 - 2025-26 season) 05/09/2024   HEMOGLOBIN A1C  06/29/2024    There are no preventive care reminders to display for this patient.   Lab Results  Component Value Date   TSH 2.240 12/29/2023   Lab Results  Component Value Date   WBC 7.6 12/29/2023   HGB 12.8 12/29/2023   HCT 37.8 12/29/2023   MCV 88 12/29/2023   PLT 211 12/29/2023   Lab Results  Component Value Date   NA 138 12/29/2023   K 4.9 12/29/2023   CO2 21 12/29/2023   GLUCOSE 92 12/29/2023   BUN 21 12/29/2023   CREATININE 1.06 (H) 12/29/2023   BILITOT 0.3 12/29/2023   ALKPHOS 79 12/29/2023   AST 22 12/29/2023   ALT 17 12/29/2023   PROT 6.3 12/29/2023   ALBUMIN 4.1 12/29/2023   CALCIUM  9.5 12/29/2023   ANIONGAP 11 08/16/2015   EGFR 58 (L) 12/29/2023   Lab Results  Component Value Date   CHOL 143 12/29/2023   Lab Results  Component Value Date   HDL 44 12/29/2023   Lab Results  Component Value Date   LDLCALC 75 12/29/2023   Lab Results  Component Value Date   TRIG 136 12/29/2023   Lab Results  Component Value Date   CHOLHDL 3.3 12/29/2023   Lab Results  Component Value Date   HGBA1C 5.7 (H) 12/29/2023        Results for orders placed or performed in visit on 01/01/24  [facility] MAMMOGRAPHY   Collection Time: 04/09/23 12:00 AM  Result Value Ref Range   [facility] Mammogram 0-4 Bi-Rad 0-4 Bi-Rad, Self Reported Normal     Assessment & Plan:   Assessment & Plan COPD with exacerbation (HCC) Acute  (negative Flu and Covid when checked last week) Improving after recent dose on doxycycline , oral steroids and prednisone  injection at [facility] near where she lives. Wheezing in the RLL on auscultation. Reports of feeling 50% better from recent exacerbation.  - Continue albuterol  as needed for wheezing and shortness of breath. - Advised to continue fluticasone -salmeterol once in the morning at @ bedtime to continue to help the inflammation.  - Recommended Delsym for cough and Mucinex for congestion. - Increase fluid intake.     Follow-up: Return if symptoms worsen or fail to improve.  An After Visit Summary was printed and given to the patient.  Harrie Cedar, FNP [facility] [phone]     [1]  Allergies Allergen Reactions   Penicillins Hives    Has patient had a PCN reaction causing immediate rash, facial/tongue/throat swelling, SOB or lightheadedness with hypotension: No Has patient had a PCN reaction causing severe rash involving mucus membranes or skin necrosis: No Has patient had a PCN reaction that required hospitalization No Has patient had a PCN reaction occurring within the last 10 years: No If all of the above answers are NO, then may proceed with Cephalosporin use.    Sulfur Dioxide    Sulfonamide Derivatives Rash   "

## 2024-12-29 ENCOUNTER — Other Ambulatory Visit

## 2025-01-02 ENCOUNTER — Ambulatory Visit: Admitting: Family Medicine
